# Patient Record
Sex: Female | Born: 1995 | Race: White | Hispanic: No | Marital: Single | Smoking: Never smoker
Health system: Southern US, Community
[De-identification: ages and names within clinical notes are randomized; demographics above are authoritative.]

## PROBLEM LIST (undated history)

## (undated) DIAGNOSIS — Z9289 Personal history of other medical treatment: Secondary | ICD-10-CM

## (undated) DIAGNOSIS — I10 Essential (primary) hypertension: Secondary | ICD-10-CM

## (undated) DIAGNOSIS — N289 Disorder of kidney and ureter, unspecified: Secondary | ICD-10-CM

## (undated) DIAGNOSIS — E215 Disorder of parathyroid gland, unspecified: Secondary | ICD-10-CM

## (undated) DIAGNOSIS — R112 Nausea with vomiting, unspecified: Secondary | ICD-10-CM

## (undated) DIAGNOSIS — Z9889 Other specified postprocedural states: Secondary | ICD-10-CM

## (undated) DIAGNOSIS — T4145XA Adverse effect of unspecified anesthetic, initial encounter: Secondary | ICD-10-CM

## (undated) DIAGNOSIS — D649 Anemia, unspecified: Secondary | ICD-10-CM

## (undated) DIAGNOSIS — M549 Dorsalgia, unspecified: Secondary | ICD-10-CM

## (undated) DIAGNOSIS — T8859XA Other complications of anesthesia, initial encounter: Secondary | ICD-10-CM

## (undated) HISTORY — PX: RENAL BIOPSY: SHX156

## (undated) HISTORY — DX: Essential (primary) hypertension: I10

## (undated) HISTORY — PX: NEPHRECTOMY TRANSPLANTED ORGAN: SUR880

---
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
## 2019-01-25 ENCOUNTER — Emergency Department (HOSPITAL_COMMUNITY)
Admission: EM | Admit: 2019-01-25 | Discharge: 2019-01-25 | Disposition: A | Discharge status: Home / Self Care | Payer: Medicaid Other | Attending: Emergency Medicine | Admitting: Emergency Medicine

## 2019-01-25 ENCOUNTER — Other Ambulatory Visit: Payer: Self-pay

## 2019-01-25 ENCOUNTER — Encounter (HOSPITAL_COMMUNITY): Payer: Self-pay | Admitting: Emergency Medicine

## 2019-01-25 DIAGNOSIS — Z940 Kidney transplant status: Secondary | ICD-10-CM | POA: Insufficient documentation

## 2019-01-25 DIAGNOSIS — Z992 Dependence on renal dialysis: Secondary | ICD-10-CM | POA: Insufficient documentation

## 2019-01-25 DIAGNOSIS — D599 Acquired hemolytic anemia, unspecified: Secondary | ICD-10-CM | POA: Insufficient documentation

## 2019-01-25 DIAGNOSIS — D649 Anemia, unspecified: Secondary | ICD-10-CM

## 2019-01-25 HISTORY — DX: Anemia, unspecified: D64.9

## 2019-01-25 HISTORY — DX: Disorder of kidney and ureter, unspecified: N28.9

## 2019-01-25 LAB — CBC WITH DIFFERENTIAL/PLATELET
Abs Immature Granulocytes: 0.03 10*3/uL (ref 0.00–0.07)
Basophils Absolute: 0.1 10*3/uL (ref 0.0–0.1)
Basophils Relative: 1 %
Eosinophils Absolute: 0.3 10*3/uL (ref 0.0–0.5)
Eosinophils Relative: 3 %
HCT: 26.7 % — ABNORMAL LOW (ref 36.0–46.0)
Hemoglobin: 8.4 g/dL — ABNORMAL LOW (ref 12.0–15.0)
Immature Granulocytes: 0 %
Lymphocytes Relative: 17 %
Lymphs Abs: 1.7 10*3/uL (ref 0.7–4.0)
MCH: 30.4 pg (ref 26.0–34.0)
MCHC: 31.5 g/dL (ref 30.0–36.0)
MCV: 96.7 fL (ref 80.0–100.0)
Monocytes Absolute: 1.1 10*3/uL — ABNORMAL HIGH (ref 0.1–1.0)
Monocytes Relative: 11 %
Neutro Abs: 6.6 10*3/uL (ref 1.7–7.7)
Neutrophils Relative %: 68 %
Platelets: 243 10*3/uL (ref 150–400)
RBC: 2.76 MIL/uL — ABNORMAL LOW (ref 3.87–5.11)
RDW: 14.5 % (ref 11.5–15.5)
WBC: 9.8 10*3/uL (ref 4.0–10.5)
nRBC: 0 % (ref 0.0–0.2)

## 2019-01-25 LAB — BASIC METABOLIC PANEL
Anion gap: 15 (ref 5–15)
BUN: 49 mg/dL — ABNORMAL HIGH (ref 6–20)
CO2: 15 mmol/L — ABNORMAL LOW (ref 22–32)
Calcium: 8.6 mg/dL — ABNORMAL LOW (ref 8.9–10.3)
Chloride: 110 mmol/L (ref 98–111)
Creatinine, Ser: 10.33 mg/dL — ABNORMAL HIGH (ref 0.44–1.00)
GFR calc Af Amer: 6 mL/min — ABNORMAL LOW (ref >60)
GFR calc non Af Amer: 5 mL/min — ABNORMAL LOW (ref >60)
Glucose, Bld: 85 mg/dL (ref 70–99)
Potassium: 3.7 mmol/L (ref 3.5–5.1)
Sodium: 140 mmol/L (ref 135–145)

## 2019-01-25 NOTE — Discharge Instructions (Signed)
Your labs were reassuring today. You will need to have dialysis tomorrow at the Dialysis center you were scheduled to go to. Please go to dialysis tomorrow as instructed. Return to the ER for emergent changes or worsening symptoms.

## 2019-01-25 NOTE — ED Triage Notes (Signed)
Pt in with need for dialysis. States she recently moved from [place] to [place] and had last trx on Monday in [place]. Called [facility] to get trx today and was told this had to be coordinated by an ED visit first and SW consult. Denies any sob

## 2019-01-25 NOTE — Progress Notes (Signed)
CSW received call from patient's RN Vicente Males regarding the patient needing assistance with coordinating her dialysis treatment due to a recent relocation from [place], [place] to [facility]. CSW informed RN that contact would be made with Terri Piedra, LCSW to assist this patient with coordinating her treatments.  CSW spoke with Terri Piedra to provide her with information for patient and her current needs. Colleen to reach out to patient's RN and patient to provide assistance.  Madilyn Fireman, MSW, LCSW-A Clinical Social Worker [facility] [phone]

## 2019-01-25 NOTE — Progress Notes (Addendum)
cRenal Navigator received call from CSW/C. Shon Baton informing of patient's arrival to ED needing OP HD arranged. Renal Navigator researched situation and worked closely with Dr. Webb/Nephrologist and OP HD Clinic Manager at [facility] to ensure that patient can receive HD treatment at [facility] tomorrow, Saturday 01/26/19. She needs to arrive at the clinic at 8:45am. Patient aware. Renal Navigator verified patient's address-correct in Epic and phone number, which she states is [phone]. All necessary labs have been drawn for her start tomorrow. She will continue on a TTS schedule after tomorrow.   Renal Navigator notes misspelling in patient's last name in Epic. There is no "d" in her last name. Renal Navigator verified spelling with patient. Renal Navigator contacted [facility] to have this corrected.   [facility] [address] [place], [place]

## 2019-01-25 NOTE — ED Provider Notes (Signed)
[facility] EMERGENCY DEPARTMENT Provider Note   CSN: [id number] Arrival date & time: 01/25/19  1106    History   Chief Complaint Chief Complaint  Patient presents with   needs dialysis    HPI    Tara Mills is a 23 y.o. female with a PMHx of anemia and kidney transplant 2007 now on dialysis emergently started last week, who presents to the ED with complaints of needing dialysis.  Patient just moved a few days ago down to the area and was supposed to get dialysis at [facility] in [place] today, but they needed "an antigen test" and therefore were not set up to do dialysis for her today, so they told her to come here to get dialysis done.  She states that she last had dialysis on Monday.  She just recently started on dialysis last week emergently.  She explains that she got "sick" at the beginning of the month, had syncopal episodes, was seen in the ED in [place] where she is from and was found to have a hemoglobin of 4, creatinine of 33, and "kidney function of 0", so she was life flighted to [facility] in [place] and started on emergent dialysis.  She was discharged and told that she was cleared to go ahead with her move down to [place], and that she was supposed to start dialysis in [place] on Friday.  Currently they think she will only need dialysis twice a week, and her next visit was supposed to be today.  When she arrived at present is in [place] they told her they were not set up for her, so her kidney doctor in [place] spoke with the [facility] and decided that she needed to come here for dialysis today.  She does not currently have a PCP here.  She is not sure of who the doctor is at the facility in [place].  Her kidney doctor in [place] is Dr. Verne Grain.  She denies having any fevers, chills, cough, CP, SOB, LE swelling, abd pain, N/V/D/C, hematuria, dysuria, myalgias, arthralgias, numbness, tingling, focal weakness, or  any other complaints at this time.   The history is provided by the patient and medical records. No language interpreter was used.    Past Medical History:  Diagnosis Date   Anemia    Renal disorder     There are no active problems to display for this patient.   Past Surgical History:  Procedure Laterality Date   NEPHRECTOMY TRANSPLANTED ORGAN     2007     OB History   No obstetric history on file.      Home Medications    Prior to Admission medications   Not on File    Family History No family history on file.  Social History Social History   Tobacco Use   Smoking status: Never Smoker   Smokeless tobacco: Never Used  Substance Use Topics   Alcohol use: Not Currently   Drug use: Never     Allergies   Patient has no known allergies.   Review of Systems Review of Systems  Constitutional: Negative for chills and fever.  Respiratory: Negative for shortness of breath.   Cardiovascular: Negative for chest pain and leg swelling.  Gastrointestinal: Negative for abdominal pain, constipation, diarrhea, nausea and vomiting.  Genitourinary: Negative for dysuria and hematuria.  Musculoskeletal: Negative for arthralgias and myalgias.  Skin: Negative for color change.  Allergic/Immunologic: Negative for immunocompromised state.  Neurological: Negative for weakness and numbness.  Psychiatric/Behavioral: Negative for confusion.   All other systems reviewed and are negative for acute change except as noted in the HPI.    Physical Exam Updated Vital Signs BP (!) 127/94 (BP Location: Right Arm)    Pulse 83    Temp 98.7 F (37.1 C) (Oral)    Resp 15    Wt 72.6 kg    SpO2 100%   Physical Exam Vitals signs and nursing note reviewed.  Constitutional:      General: She is not in acute distress.    Appearance: Normal appearance. She is well-developed. She is not toxic-appearing.     Comments: Afebrile, nontoxic, NAD  HENT:     Head: Normocephalic and  atraumatic.  Eyes:     General:        Right eye: No discharge.        Left eye: No discharge.     Conjunctiva/sclera: Conjunctivae normal.  Neck:     Musculoskeletal: Normal range of motion and neck supple.  Cardiovascular:     Rate and Rhythm: Normal rate and regular rhythm.     Pulses: Normal pulses.     Heart sounds: Normal heart sounds, S1 normal and S2 normal. No murmur. No friction rub. No gallop.   Pulmonary:     Effort: Pulmonary effort is normal. No respiratory distress.     Breath sounds: Normal breath sounds. No decreased breath sounds, wheezing, rhonchi or rales.  Chest:     Comments: Dialysis catheter to R chest wall c/d/i Abdominal:     General: Bowel sounds are normal. There is no distension.     Palpations: Abdomen is soft. Abdomen is not rigid.     Tenderness: There is no abdominal tenderness. There is no right CVA tenderness, left CVA tenderness, guarding or rebound. Negative signs include Murphy's sign and McBurney's sign.     Comments: Well healed scar to RLQ abd soft, NTND, no r/g/r, +BS throughout, neg murphy's, neg mcburney's, no CVA TTP  Musculoskeletal: Normal range of motion.  Skin:    General: Skin is warm and dry.     Findings: No rash.  Neurological:     Mental Status: She is alert and oriented to person, place, and time.     Sensory: Sensation is intact. No sensory deficit.     Motor: Motor function is intact.  Psychiatric:        Mood and Affect: Mood and affect normal.        Behavior: Behavior normal.      ED Treatments / Results  Labs (all labs ordered are listed, but only abnormal results are displayed) Labs Reviewed  CBC WITH DIFFERENTIAL/PLATELET - Abnormal; Notable for the following components:      Result Value   RBC 2.76 (*)    Hemoglobin 8.4 (*)    HCT 26.7 (*)    Monocytes Absolute 1.1 (*)    All other components within normal limits  BASIC METABOLIC PANEL - Abnormal; Notable for the following components:   CO2 15 (*)     BUN 49 (*)    Creatinine, Ser 10.33 (*)    Calcium 8.6 (*)    GFR calc non Af Amer 5 (*)    GFR calc Af Amer 6 (*)    All other components within normal limits    EKG None  Radiology No results found.  Procedures Procedures (including critical care time)  Medications Ordered in ED Medications - No data to display   Initial Impression /  Assessment and Plan / ED Course  I have reviewed the triage vital signs and the nursing notes.  Pertinent labs & imaging results that were available during my care of the patient were reviewed by me and considered in my medical decision making (see chart for details).        23 y.o. female here with needing dialysis. Just moved here from [place], had kidney transplant in 2007, was well until earlier this month started not feeling well, went to ER in [place] and found to have Cr 33, "kidney function of zero", and Hgb 4. Was transferred to [facility] and started emergent dialysis. Last dialysis was Monday, was supposed to get dialysis today at [facility] in [place] but it wasn't set up for her yet so they told her to come here for dialysis. Feels well at this time. On exam, no abdominal tenderness, clear lungs, no visible edema in the legs. Dialysis cath in R chest c/d/i. Will get labs and call nephrology to discuss case. Discussed case with my attending Dr. Eulis Foster who agrees with plan.   12:19 PM Dr. Justin Mend of nephrology returning page, wants labs and call back at that time, he will work on coordinating care. Will reassess once labs are back.   1:17 PM CBC w/diff with Hgb 8.4/Hct 26.7 unclear baseline but otherwise unremarkable. BMP with normal K, bicarb 15, BUN 49, Cr 10.33, GFR 5. Dr. Justin Mend down to see pt, states she's stable for outpatient dialysis, the center is set up for her tomorrow and she can go there tomorrow for her dialysis since she doesn't appear to need emergent dialysis currently. Pt feels well and is stable for d/c. Advised going to dialysis  tomorrow as instructed. I explained the diagnosis and have given explicit precautions to return to the ER including for any other new or worsening symptoms. The patient understands and accepts the medical plan as it's been dictated and I have answered their questions. Discharge instructions concerning home care and prescriptions have been given. The patient is STABLE and is discharged to home in good condition.    Final Clinical Impressions(s) / ED Diagnoses   Final diagnoses:  Dialysis patient [facility])  Chronic anemia    ED Discharge Orders    [address], [place], [place] 01/25/19 [place], MD 01/26/19 [phone]

## 2019-05-02 MED ORDER — ANTI-OXIDANT TAKE ONE PO
0.50 | ORAL | Status: DC
Start: ? — End: 2019-05-02

## 2019-05-02 MED ORDER — CLOTRIMAZOLE 3 DAY VA
210.00 | VAGINAL | Status: DC
Start: 2019-05-01 — End: 2019-05-02

## 2019-05-02 MED ORDER — PSEUDOEPHEDRINE-GG & DM 60-375 & 20 MG &MG/5ML PO KIT
1000.00 | PACK | ORAL | Status: DC
Start: 2019-04-30 — End: 2019-05-02

## 2019-05-02 MED ORDER — ISOVUE-M 300 61 % IJ SOLN
4.00 | INTRAMUSCULAR | Status: DC
Start: ? — End: 2019-05-02

## 2019-05-02 MED ORDER — DAMOR DRESSING EX PADS
10.00 | MEDICATED_PAD | CUTANEOUS | Status: DC
Start: 2019-05-03 — End: 2019-05-02

## 2019-05-02 MED ORDER — Medication
750.00 | Status: DC
Start: 2019-04-30 — End: 2019-05-02

## 2019-05-02 MED ORDER — SODIUM CHLORIDE 0.9 % IV SOLN
INTRAVENOUS | Status: DC
Start: ? — End: 2019-05-02

## 2019-05-02 MED ORDER — DERMACAINE-ALOE 6-0.1 % EX CREA
2.00 | TOPICAL_CREAM | CUTANEOUS | Status: DC
Start: ? — End: 2019-05-02

## 2019-05-02 MED ORDER — MP TRI-FED COLD 2.5-60 MG PO TABS
25.00 | ORAL_TABLET | ORAL | Status: DC
Start: ? — End: 2019-05-02

## 2019-05-02 MED ORDER — OXYCODONE HCL 5 MG/5ML PO SOLN
5.00 | ORAL | Status: DC
Start: ? — End: 2019-05-02

## 2019-05-02 MED ORDER — CELLULOSE SODIUM PHOSPHATE VI
5000.00 | Status: DC
Start: 2019-04-30 — End: 2019-05-02

## 2019-05-02 MED ORDER — PHENYLEPHRINE-GUAIFENESIN 30-400 MG PO CP12
10.00 | ORAL_CAPSULE | ORAL | Status: DC
Start: 2019-05-03 — End: 2019-05-02

## 2019-05-02 MED ORDER — PSEUDOEPHEDRINE-GG & DM 60-375 & 20 MG &MG/5ML PO KIT
1000.00 | PACK | ORAL | Status: DC
Start: 2019-05-02 — End: 2019-05-02

## 2019-05-02 MED ORDER — Medication
10.00 | Status: DC
Start: 2019-04-30 — End: 2019-05-02

## 2019-05-02 MED ORDER — PHENYLEPHRINE-GUAIFENESIN 20-375 MG PO CP12
10.00 | ORAL_CAPSULE | ORAL | Status: DC
Start: ? — End: 2019-05-02

## 2019-05-02 MED ORDER — Medication
1.00 | Status: DC
Start: 2019-04-30 — End: 2019-05-02

## 2019-05-02 MED ORDER — DAMOR DRESSING EX PADS
10.00 | MEDICATED_PAD | CUTANEOUS | Status: DC
Start: ? — End: 2019-05-02

## 2019-05-02 MED ORDER — SURE COMFORT INSULIN SYRINGE 30G X 1/2" 0.5 ML MISC
25.00 | Status: DC
Start: 2019-05-01 — End: 2019-05-02

## 2019-05-02 MED ORDER — SURE COMFORT INSULIN SYRINGE 30G X 1/2" 0.5 ML MISC
25.00 | Status: DC
Start: 2019-05-03 — End: 2019-05-02

## 2019-05-02 MED ORDER — PRO HERBS ENERGY PO TABS
20.00 | ORAL_TABLET | ORAL | Status: DC
Start: 2019-05-02 — End: 2019-05-02

## 2019-05-02 MED ORDER — HEART 140 MG PO TABS
0.50 | ORAL_TABLET | ORAL | Status: DC
Start: ? — End: 2019-05-02

## 2019-05-02 MED ORDER — SM ROLLED GAUZE 2"X2.5YD MISC
2.00 | Status: DC
Start: 2019-05-02 — End: 2019-05-02

## 2019-05-02 MED ORDER — GLUCOSAMINE-CHONDROIT-COLLAGEN PO
100.00 | ORAL | Status: DC
Start: ? — End: 2019-05-02

## 2019-05-02 MED ORDER — OATMEAL BATH OILATED EX PACK
200.00 | PACK | CUTANEOUS | Status: DC
Start: ? — End: 2019-05-02

## 2019-05-02 MED ORDER — Medication
1.00 | Status: DC
Start: ? — End: 2019-05-02

## 2019-05-02 MED ORDER — GENERIC EXTERNAL MEDICATION
10.00 | Status: DC
Start: ? — End: 2019-05-02

## 2019-05-02 MED ORDER — FP ANTI-DIARRHEAL 1 MG/5ML PO LIQD
500.00 | ORAL | Status: DC
Start: 2019-04-30 — End: 2019-05-02

## 2019-05-02 MED ORDER — CELLULOSE SODIUM PHOSPHATE VI
5000.00 | Status: DC
Start: 2019-05-02 — End: 2019-05-02

## 2019-05-02 MED ORDER — NUTREN RENAL PO LIQD
1600.00 | ORAL | Status: DC
Start: 2019-05-02 — End: 2019-05-02

## 2019-05-02 MED ORDER — EQUATE NICOTINE 4 MG MT GUM
4.00 | CHEWING_GUM | OROMUCOSAL | Status: DC
Start: ? — End: 2019-05-02

## 2019-05-02 MED ORDER — Medication
1.00 | Status: DC
Start: 2019-05-01 — End: 2019-05-02

## 2019-05-02 MED ORDER — SM ROLLED GAUZE 2"X2.5YD MISC
2.00 | Status: DC
Start: 2019-04-30 — End: 2019-05-02

## 2019-05-02 MED ORDER — Medication
1.00 | Status: DC
Start: 2019-05-03 — End: 2019-05-02

## 2019-05-02 MED ORDER — METHYLPHENIDATE HCL POWD
50.00 | Status: DC
Start: ? — End: 2019-05-02

## 2019-05-02 MED ORDER — PHENYLEPHRINE-GUAIFENESIN 30-400 MG PO CP12
5.00 | ORAL_CAPSULE | ORAL | Status: DC
Start: 2019-05-01 — End: 2019-05-02

## 2019-05-02 MED ORDER — NUTREN RENAL PO LIQD
1600.00 | ORAL | Status: DC
Start: 2019-04-30 — End: 2019-05-02

## 2019-05-02 MED ORDER — QUINERVA 260 MG PO TABS
650.00 | ORAL_TABLET | ORAL | Status: DC
Start: ? — End: 2019-05-02

## 2019-05-02 MED ORDER — Medication
30.00 | Status: DC
Start: ? — End: 2019-05-02

## 2019-07-04 ENCOUNTER — Ambulatory Visit: Payer: Self-pay | Admitting: Surgery

## 2019-10-18 ENCOUNTER — Emergency Department (HOSPITAL_COMMUNITY): Payer: Medicaid Other

## 2019-10-18 ENCOUNTER — Emergency Department (HOSPITAL_COMMUNITY)
Admission: EM | Admit: 2019-10-18 | Discharge: 2019-10-18 | Disposition: A | Discharge status: Home / Self Care | Payer: Medicaid Other | Attending: Emergency Medicine | Admitting: Emergency Medicine

## 2019-10-18 ENCOUNTER — Other Ambulatory Visit: Payer: Self-pay

## 2019-10-18 ENCOUNTER — Encounter (HOSPITAL_COMMUNITY): Payer: Self-pay | Admitting: Emergency Medicine

## 2019-10-18 DIAGNOSIS — G43109 Migraine with aura, not intractable, without status migrainosus: Secondary | ICD-10-CM | POA: Diagnosis not present

## 2019-10-18 DIAGNOSIS — N186 End stage renal disease: Secondary | ICD-10-CM | POA: Diagnosis not present

## 2019-10-18 DIAGNOSIS — Z992 Dependence on renal dialysis: Secondary | ICD-10-CM | POA: Insufficient documentation

## 2019-10-18 DIAGNOSIS — H538 Other visual disturbances: Secondary | ICD-10-CM | POA: Diagnosis present

## 2019-10-18 LAB — CBC WITH DIFFERENTIAL/PLATELET
Abs Immature Granulocytes: 0.04 10*3/uL (ref 0.00–0.07)
Basophils Absolute: 0 10*3/uL (ref 0.0–0.1)
Basophils Relative: 1 %
Eosinophils Absolute: 0.2 10*3/uL (ref 0.0–0.5)
Eosinophils Relative: 3 %
HCT: 26.1 % — ABNORMAL LOW (ref 36.0–46.0)
Hemoglobin: 8.2 g/dL — ABNORMAL LOW (ref 12.0–15.0)
Immature Granulocytes: 1 %
Lymphocytes Relative: 11 %
Lymphs Abs: 0.7 10*3/uL (ref 0.7–4.0)
MCH: 32 pg (ref 26.0–34.0)
MCHC: 31.4 g/dL (ref 30.0–36.0)
MCV: 102 fL — ABNORMAL HIGH (ref 80.0–100.0)
Monocytes Absolute: 0.5 10*3/uL (ref 0.1–1.0)
Monocytes Relative: 8 %
Neutro Abs: 5 10*3/uL (ref 1.7–7.7)
Neutrophils Relative %: 76 %
Platelets: 163 10*3/uL (ref 150–400)
RBC: 2.56 MIL/uL — ABNORMAL LOW (ref 3.87–5.11)
RDW: 15.6 % — ABNORMAL HIGH (ref 11.5–15.5)
WBC: 6.4 10*3/uL (ref 4.0–10.5)
nRBC: 0 % (ref 0.0–0.2)

## 2019-10-18 LAB — BASIC METABOLIC PANEL
Anion gap: 24 — ABNORMAL HIGH (ref 5–15)
BUN: 97 mg/dL — ABNORMAL HIGH (ref 6–20)
CO2: 14 mmol/L — ABNORMAL LOW (ref 22–32)
Calcium: 8 mg/dL — ABNORMAL LOW (ref 8.9–10.3)
Chloride: 101 mmol/L (ref 98–111)
Creatinine, Ser: 23.98 mg/dL — ABNORMAL HIGH (ref 0.44–1.00)
GFR calc Af Amer: 2 mL/min — ABNORMAL LOW (ref >60)
GFR calc non Af Amer: 2 mL/min — ABNORMAL LOW (ref >60)
Glucose, Bld: 99 mg/dL (ref 70–99)
Potassium: 4.6 mmol/L (ref 3.5–5.1)
Sodium: 139 mmol/L (ref 135–145)

## 2019-10-18 MED ORDER — PROMETHAZINE HCL 25 MG/ML IJ SOLN
12.5000 mg | Freq: Once | INTRAMUSCULAR | Status: AC
  Administered 2019-10-18: 12.5 mg via INTRAVENOUS
  Filled 2019-10-18: qty 1

## 2019-10-18 MED ORDER — PROMETHAZINE HCL 25 MG/ML IJ SOLN
12.5000 mg | Freq: Once | INTRAMUSCULAR | Status: AC
  Administered 2019-10-18: 13:00:00 12.5 mg via INTRAVENOUS
  Filled 2019-10-18: qty 1

## 2019-10-18 MED ORDER — IOHEXOL 350 MG/ML SOLN
75.0000 mL | Freq: Once | INTRAVENOUS | Status: AC | PRN
  Administered 2019-10-18: 75 mL via INTRAVENOUS

## 2019-10-18 NOTE — ED Provider Notes (Signed)
[facility] EMERGENCY DEPARTMENT Provider Note   CSN: [id number] Arrival date & time: 10/18/19  1113     History Chief Complaint  Patient presents with  . Vision Changes    Tara Mills is a 24 y.o. female.  HPI Patient presents with visual chain on does been going on for a while but recently developed spots in her right eye.  States that they will be in different areas of the eye.  It will come and go.  Sometimes will move around a little bit.  Usually accompanied with a headache.  No numbness or weakness.  She is a dialysis patient.  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  She is currently on peritoneal dialysis.  No other numbness or weakness.  She wears glasses that she states she got a few months ago.  States she always has decreased vision in her left eye.    Past Medical History:  Diagnosis Date  . Anemia   . Renal disorder     There are no problems to display for this patient.   Past Surgical History:  Procedure Laterality Date  . NEPHRECTOMY TRANSPLANTED ORGAN     2007     OB History   No obstetric history on file.     No family history on file.  Social History   Tobacco Use  . Smoking status: Never Smoker  . Smokeless tobacco: Never Used  Substance Use Topics  . Alcohol use: Not Currently  . Drug use: Never    Home Medications Prior to Admission medications   Not on File    Allergies    Patient has no known allergies.  Review of Systems   Review of Systems  Constitutional: Negative for appetite change.  HENT: Negative for congestion.   Eyes: Positive for visual disturbance.  Respiratory: Negative for shortness of breath.   Gastrointestinal: Negative for abdominal pain.  Genitourinary: Negative for flank pain.  Musculoskeletal: Negative for back pain.  Skin: Negative for rash.  Neurological: Negative for weakness.  Psychiatric/Behavioral: Negative for confusion.    Physical Exam  Updated Vital Signs BP (!) 186/134 (BP Location: Left Arm)   Pulse (!) 101   Temp 98 F (36.7 C) (Oral)   Resp 17   Ht 5' (1.524 m)   Wt 57.6 kg   LMP 10/06/2019   SpO2 98%   BMI 24.80 kg/m   Physical Exam Vitals reviewed.  HENT:     Head:     Comments: Patient has blue hair    Mouth/Throat:     Mouth: Mucous membranes are moist.  Eyes:     Extraocular Movements: Extraocular movements intact.     Pupils: Pupils are equal, round, and reactive to light.  Cardiovascular:     Rate and Rhythm: Normal rate.  Pulmonary:     Effort: Pulmonary effort is normal.  Abdominal:     Tenderness: There is no abdominal tenderness.  Neurological:     Mental Status: She is alert.     ED Results / Procedures / Treatments   Labs (all labs ordered are listed, but only abnormal results are displayed) Labs Reviewed  CBC WITH DIFFERENTIAL/PLATELET - Abnormal; Notable for the following components:      Result Value   RBC 2.56 (*)    Hemoglobin 8.2 (*)    HCT 26.1 (*)    MCV 102.0 (*)    RDW 15.6 (*)    All other components within  normal limits  BASIC METABOLIC PANEL - Abnormal; Notable for the following components:   CO2 14 (*)    BUN 97 (*)    Creatinine, Ser 23.98 (*)    Calcium 8.0 (*)    GFR calc non Af Amer 2 (*)    GFR calc Af Amer 2 (*)    Anion gap 24 (*)    All other components within normal limits    EKG None  Radiology No results found.  Procedures Procedures (including critical care time)  Medications Ordered in ED Medications  promethazine (PHENERGAN) injection 12.5 mg (12.5 mg Intravenous Given 10/18/19 1326)  promethazine (PHENERGAN) injection 12.5 mg (12.5 mg Intravenous Given 10/18/19 1438)    ED Course  I have reviewed the triage vital signs and the nursing notes.  Pertinent labs & imaging results that were available during my care of the patient were reviewed by me and considered in my medical decision making (see chart for details).    MDM  Rules/Calculators/A&P                      Patient with vision changes.  Mostly in the right eye.  Has end-stage renal disease is on peritoneal dialysis.  Had failed previous kidney transplant.  Vision changes move around in her.I think is more likely complicated migraine/aura.  However is not had work-up for.  Will get head CT with contrast to evaluate vasculature also since there is some posterior neck pain.  However CT scan will not be done unless I call nephrology.  I have discussed with Dr. Posey Pronto from nephrology and he thinks it is okay for the scan. Care will be turned over to oncoming provider, Dr. Stark Jock Final Clinical Impression(s) / ED Diagnoses Final diagnoses:  Complicated migraine  End stage renal disease on dialysis [facility])    Rx / DC Orders ED Discharge Orders    None       Davonna Belling, MD 10/18/19 1452

## 2019-10-18 NOTE — ED Provider Notes (Signed)
Patient is a 24 year old female with history of end-stage renal disease on peritoneal dialysis presenting with complaints of headache and visual disturbances.  Care was signed out to me awaiting results of a CT angiogram of the head and neck.  The studies have been performed and are unremarkable.  Patient neurologically intact and is feeling better after receiving Phenergan.  Patient to be discharged with follow up with neurology and ophthalmology, to return as needed for any problems.   Veryl Speak, MD 10/18/19 [phone]

## 2019-10-18 NOTE — Discharge Instructions (Addendum)
Follow-up with ophthalmology and neurology.  The contact information for the specialties has been provided in this discharge summary for you to call and make these arrangements.

## 2019-10-18 NOTE — ED Triage Notes (Signed)
Patient reports black spots and worsening vision in her right eye. States she is unsure if much difference in her left eye since vision is bad. Symptoms are intermittent. Patient is PD dialysis done daily.

## 2019-10-18 NOTE — ED Notes (Signed)
Returned from Ct scan  

## 2019-10-21 ENCOUNTER — Inpatient Hospital Stay (HOSPITAL_COMMUNITY): Payer: Medicaid Other

## 2019-10-21 ENCOUNTER — Emergency Department (HOSPITAL_COMMUNITY): Payer: Medicaid Other

## 2019-10-21 ENCOUNTER — Inpatient Hospital Stay (HOSPITAL_COMMUNITY)
Admission: EM | Admit: 2019-10-21 | Discharge: 2019-10-26 | DRG: 304 | Disposition: A | Discharge status: Home / Self Care | Payer: Medicaid Other | Source: Ambulatory Visit | Attending: Internal Medicine | Admitting: Internal Medicine

## 2019-10-21 ENCOUNTER — Other Ambulatory Visit: Payer: Self-pay

## 2019-10-21 ENCOUNTER — Encounter (HOSPITAL_COMMUNITY): Payer: Self-pay | Admitting: Emergency Medicine

## 2019-10-21 DIAGNOSIS — I1311 Hypertensive heart and chronic kidney disease without heart failure, with stage 5 chronic kidney disease, or end stage renal disease: Secondary | ICD-10-CM | POA: Diagnosis present

## 2019-10-21 DIAGNOSIS — D696 Thrombocytopenia, unspecified: Secondary | ICD-10-CM | POA: Diagnosis present

## 2019-10-21 DIAGNOSIS — D631 Anemia in chronic kidney disease: Secondary | ICD-10-CM | POA: Diagnosis present

## 2019-10-21 DIAGNOSIS — E872 Acidosis: Secondary | ICD-10-CM | POA: Diagnosis present

## 2019-10-21 DIAGNOSIS — Z886 Allergy status to analgesic agent status: Secondary | ICD-10-CM | POA: Diagnosis not present

## 2019-10-21 DIAGNOSIS — N2581 Secondary hyperparathyroidism of renal origin: Secondary | ICD-10-CM | POA: Diagnosis present

## 2019-10-21 DIAGNOSIS — I120 Hypertensive chronic kidney disease with stage 5 chronic kidney disease or end stage renal disease: Secondary | ICD-10-CM | POA: Diagnosis not present

## 2019-10-21 DIAGNOSIS — E875 Hyperkalemia: Secondary | ICD-10-CM | POA: Diagnosis present

## 2019-10-21 DIAGNOSIS — Y830 Surgical operation with transplant of whole organ as the cause of abnormal reaction of the patient, or of later complication, without mention of misadventure at the time of the procedure: Secondary | ICD-10-CM | POA: Diagnosis present

## 2019-10-21 DIAGNOSIS — T8612 Kidney transplant failure: Secondary | ICD-10-CM | POA: Diagnosis present

## 2019-10-21 DIAGNOSIS — H469 Unspecified optic neuritis: Secondary | ICD-10-CM | POA: Diagnosis present

## 2019-10-21 DIAGNOSIS — H4710 Unspecified papilledema: Secondary | ICD-10-CM

## 2019-10-21 DIAGNOSIS — Z91018 Allergy to other foods: Secondary | ICD-10-CM

## 2019-10-21 DIAGNOSIS — Z992 Dependence on renal dialysis: Secondary | ICD-10-CM

## 2019-10-21 DIAGNOSIS — D649 Anemia, unspecified: Secondary | ICD-10-CM | POA: Diagnosis not present

## 2019-10-21 DIAGNOSIS — H543 Unqualified visual loss, both eyes: Secondary | ICD-10-CM | POA: Diagnosis present

## 2019-10-21 DIAGNOSIS — Z79899 Other long term (current) drug therapy: Secondary | ICD-10-CM | POA: Diagnosis not present

## 2019-10-21 DIAGNOSIS — Q639 Congenital malformation of kidney, unspecified: Secondary | ICD-10-CM | POA: Diagnosis not present

## 2019-10-21 DIAGNOSIS — J189 Pneumonia, unspecified organism: Secondary | ICD-10-CM | POA: Diagnosis present

## 2019-10-21 DIAGNOSIS — Z20822 Contact with and (suspected) exposure to covid-19: Secondary | ICD-10-CM | POA: Diagnosis present

## 2019-10-21 DIAGNOSIS — H547 Unspecified visual loss: Secondary | ICD-10-CM

## 2019-10-21 DIAGNOSIS — G2581 Restless legs syndrome: Secondary | ICD-10-CM | POA: Diagnosis present

## 2019-10-21 DIAGNOSIS — N186 End stage renal disease: Secondary | ICD-10-CM

## 2019-10-21 DIAGNOSIS — G43909 Migraine, unspecified, not intractable, without status migrainosus: Secondary | ICD-10-CM | POA: Diagnosis not present

## 2019-10-21 DIAGNOSIS — I161 Hypertensive emergency: Secondary | ICD-10-CM | POA: Diagnosis present

## 2019-10-21 DIAGNOSIS — Z9889 Other specified postprocedural states: Secondary | ICD-10-CM | POA: Diagnosis not present

## 2019-10-21 DIAGNOSIS — I313 Pericardial effusion (noninflammatory): Secondary | ICD-10-CM | POA: Diagnosis present

## 2019-10-21 DIAGNOSIS — D84821 Immunodeficiency due to drugs: Secondary | ICD-10-CM | POA: Diagnosis present

## 2019-10-21 DIAGNOSIS — Z8249 Family history of ischemic heart disease and other diseases of the circulatory system: Secondary | ICD-10-CM

## 2019-10-21 DIAGNOSIS — I160 Hypertensive urgency: Secondary | ICD-10-CM | POA: Diagnosis not present

## 2019-10-21 LAB — RESPIRATORY PANEL BY RT PCR (FLU A&B, COVID)
Influenza A by PCR: NEGATIVE
Influenza B by PCR: NEGATIVE
SARS Coronavirus 2 by RT PCR: NEGATIVE

## 2019-10-21 LAB — PROTIME-INR
INR: 1.2 (ref 0.8–1.2)
Prothrombin Time: 14.9 seconds (ref 11.4–15.2)

## 2019-10-21 LAB — CSF CELL COUNT WITH DIFFERENTIAL
RBC Count, CSF: 0 /mm3
Tube #: 3
WBC, CSF: 1 /mm3 (ref 0–5)

## 2019-10-21 LAB — CRYPTOCOCCAL ANTIGEN, CSF: Crypto Ag: NEGATIVE

## 2019-10-21 LAB — BASIC METABOLIC PANEL WITH GFR
Anion gap: 26 — ABNORMAL HIGH (ref 5–15)
BUN: 119 mg/dL — ABNORMAL HIGH (ref 6–20)
CO2: 10 mmol/L — ABNORMAL LOW (ref 22–32)
Calcium: 8.3 mg/dL — ABNORMAL LOW (ref 8.9–10.3)
Chloride: 99 mmol/L (ref 98–111)
Creatinine, Ser: 28.74 mg/dL — ABNORMAL HIGH (ref 0.44–1.00)
GFR calc Af Amer: 2 mL/min — ABNORMAL LOW
GFR calc non Af Amer: 1 mL/min — ABNORMAL LOW
Glucose, Bld: 86 mg/dL (ref 70–99)
Potassium: 5.9 mmol/L — ABNORMAL HIGH (ref 3.5–5.1)
Sodium: 135 mmol/L (ref 135–145)

## 2019-10-21 LAB — PROTEIN AND GLUCOSE, CSF
Glucose, CSF: 50 mg/dL (ref 40–70)
Total  Protein, CSF: 30 mg/dL (ref 15–45)

## 2019-10-21 LAB — HEPATIC FUNCTION PANEL
ALT: 13 U/L (ref 0–44)
AST: 12 U/L — ABNORMAL LOW (ref 15–41)
Albumin: 2.8 g/dL — ABNORMAL LOW (ref 3.5–5.0)
Alkaline Phosphatase: 119 U/L (ref 38–126)
Bilirubin, Direct: <0.1 mg/dL (ref 0.0–0.2)
Total Bilirubin: 1 mg/dL (ref 0.3–1.2)
Total Protein: 6.3 g/dL — ABNORMAL LOW (ref 6.5–8.1)

## 2019-10-21 LAB — CBC
HCT: 27.7 % — ABNORMAL LOW (ref 36.0–46.0)
Hemoglobin: 8.3 g/dL — ABNORMAL LOW (ref 12.0–15.0)
MCH: 31.6 pg (ref 26.0–34.0)
MCHC: 30 g/dL (ref 30.0–36.0)
MCV: 105.3 fL — ABNORMAL HIGH (ref 80.0–100.0)
Platelets: 117 10*3/uL — ABNORMAL LOW (ref 150–400)
RBC: 2.63 MIL/uL — ABNORMAL LOW (ref 3.87–5.11)
RDW: 15 % (ref 11.5–15.5)
WBC: 11.3 10*3/uL — ABNORMAL HIGH (ref 4.0–10.5)
nRBC: 0 % (ref 0.0–0.2)

## 2019-10-21 LAB — HIV ANTIBODY (ROUTINE TESTING W REFLEX): HIV Screen 4th Generation wRfx: NONREACTIVE

## 2019-10-21 MED ORDER — DELFLEX-LC/2.5% DEXTROSE 394 MOSM/L IP SOLN: INTRAPERITONEAL | Status: DC

## 2019-10-21 MED ORDER — SODIUM CHLORIDE 0.9 % IV SOLN
1.0000 g | INTRAVENOUS | Status: DC
  Administered 2019-10-22 – 2019-10-23 (×2): 1 g via INTRAVENOUS
  Filled 2019-10-21 (×2): qty 10

## 2019-10-21 MED ORDER — AZITHROMYCIN 250 MG PO TABS
250.0000 mg | ORAL_TABLET | Freq: Every day | ORAL | Status: DC
  Administered 2019-10-22 – 2019-10-24 (×3): 250 mg via ORAL
  Filled 2019-10-21 (×4): qty 1

## 2019-10-21 MED ORDER — SODIUM ZIRCONIUM CYCLOSILICATE 10 G PO PACK
10.0000 g | PACK | Freq: Once | ORAL | Status: AC
  Administered 2019-10-21: 18:00:00 10 g via ORAL
  Filled 2019-10-21: qty 1

## 2019-10-21 MED ORDER — CALCITRIOL 0.25 MCG PO CAPS
0.5000 ug | ORAL_CAPSULE | ORAL | Status: DC
  Administered 2019-10-25 – 2019-10-26 (×2): 0.5 ug via ORAL
  Filled 2019-10-21: qty 2

## 2019-10-21 MED ORDER — ACETAMINOPHEN 650 MG RE SUPP: 650.0000 mg | Freq: Four times a day (QID) | RECTAL | Status: DC | PRN

## 2019-10-21 MED ORDER — CINACALCET HCL 30 MG PO TABS
30.0000 mg | ORAL_TABLET | Freq: Every day | ORAL | Status: DC
  Administered 2019-10-24 – 2019-10-26 (×3): 30 mg via ORAL
  Filled 2019-10-21 (×5): qty 1

## 2019-10-21 MED ORDER — LOSARTAN POTASSIUM 50 MG PO TABS: 100.0000 mg | ORAL_TABLET | Freq: Every day | ORAL | Status: DC

## 2019-10-21 MED ORDER — PROMETHAZINE HCL 25 MG/ML IJ SOLN
25.0000 mg | Freq: Four times a day (QID) | INTRAMUSCULAR | Status: DC | PRN
  Administered 2019-10-21 – 2019-10-22 (×2): 25 mg via INTRAVENOUS
  Filled 2019-10-21 (×2): qty 1

## 2019-10-21 MED ORDER — ACETAMINOPHEN 325 MG PO TABS
650.0000 mg | ORAL_TABLET | Freq: Four times a day (QID) | ORAL | Status: DC | PRN
  Administered 2019-10-22: 650 mg via ORAL
  Filled 2019-10-21: qty 2

## 2019-10-21 MED ORDER — DARBEPOETIN ALFA 200 MCG/0.4ML IJ SOSY
200.0000 ug | PREFILLED_SYRINGE | INTRAMUSCULAR | Status: DC
  Filled 2019-10-21: qty 0.4

## 2019-10-21 MED ORDER — SODIUM BICARBONATE 650 MG PO TABS
1300.0000 mg | ORAL_TABLET | Freq: Two times a day (BID) | ORAL | Status: DC
  Administered 2019-10-22 – 2019-10-23 (×3): 1300 mg via ORAL
  Filled 2019-10-21 (×2): qty 2

## 2019-10-21 MED ORDER — ONDANSETRON HCL 4 MG/2ML IJ SOLN
4.0000 mg | Freq: Four times a day (QID) | INTRAMUSCULAR | Status: DC | PRN
  Administered 2019-10-21 – 2019-10-23 (×4): 4 mg via INTRAVENOUS
  Filled 2019-10-21 (×4): qty 2

## 2019-10-21 MED ORDER — CALCITRIOL 0.5 MCG PO CAPS
1.5000 ug | ORAL_CAPSULE | ORAL | Status: DC
  Filled 2019-10-21: qty 3

## 2019-10-21 MED ORDER — FAMOTIDINE 20 MG PO TABS
20.0000 mg | ORAL_TABLET | Freq: Every day | ORAL | Status: DC
  Administered 2019-10-21 – 2019-10-25 (×5): 20 mg via ORAL
  Filled 2019-10-21 (×6): qty 1

## 2019-10-21 MED ORDER — MYCOPHENOLATE MOFETIL 250 MG PO CAPS
1000.0000 mg | ORAL_CAPSULE | Freq: Two times a day (BID) | ORAL | Status: DC
  Administered 2019-10-22 – 2019-10-23 (×4): 1000 mg via ORAL
  Filled 2019-10-21 (×5): qty 4

## 2019-10-21 MED ORDER — HEPARIN 1000 UNIT/ML FOR PERITONEAL DIALYSIS: 500.0000 [IU] | INTRAMUSCULAR | Status: DC | PRN

## 2019-10-21 MED ORDER — RENA-VITE PO TABS
1.0000 | ORAL_TABLET | Freq: Every day | ORAL | Status: DC
  Administered 2019-10-21 – 2019-10-25 (×5): 1 via ORAL
  Filled 2019-10-21 (×6): qty 1

## 2019-10-21 MED ORDER — HEPARIN SODIUM (PORCINE) 5000 UNIT/ML IJ SOLN
5000.0000 [IU] | Freq: Three times a day (TID) | INTRAMUSCULAR | Status: DC
  Filled 2019-10-21 (×2): qty 1

## 2019-10-21 MED ORDER — OXYCODONE HCL 5 MG PO TABS
5.0000 mg | ORAL_TABLET | Freq: Once | ORAL | Status: AC
  Administered 2019-10-21: 22:00:00 5 mg via ORAL
  Filled 2019-10-21: qty 1

## 2019-10-21 MED ORDER — SODIUM CHLORIDE 0.9 % IV SOLN
125.0000 mg | INTRAVENOUS | Status: DC
  Administered 2019-10-22 – 2019-10-26 (×3): 125 mg via INTRAVENOUS
  Filled 2019-10-21 (×5): qty 10

## 2019-10-21 MED ORDER — DELFLEX-LC/1.5% DEXTROSE 344 MOSM/L IP SOLN: INTRAPERITONEAL | Status: DC

## 2019-10-21 MED ORDER — LABETALOL HCL 5 MG/ML IV SOLN
10.0000 mg | INTRAVENOUS | Status: DC | PRN
  Administered 2019-10-21: 19:00:00 10 mg via INTRAVENOUS
  Filled 2019-10-21: qty 4

## 2019-10-21 MED ORDER — GENTAMICIN SULFATE 0.1 % EX CREA
1.0000 "application " | TOPICAL_CREAM | Freq: Every day | CUTANEOUS | Status: DC
  Filled 2019-10-21 (×2): qty 15

## 2019-10-21 MED ORDER — HEPARIN 1000 UNIT/ML FOR PERITONEAL DIALYSIS: INTRAPERITONEAL | Status: DC | PRN

## 2019-10-21 MED ORDER — SODIUM BICARBONATE 8.4 % IV SOLN
100.0000 meq | Freq: Once | INTRAVENOUS | Status: AC
  Administered 2019-10-21: 20:00:00 100 meq via INTRAVENOUS
  Filled 2019-10-21: qty 50

## 2019-10-21 MED ORDER — TACROLIMUS 1 MG PO CAPS
3.0000 mg | ORAL_CAPSULE | Freq: Two times a day (BID) | ORAL | Status: DC
  Administered 2019-10-22 – 2019-10-25 (×7): 3 mg via ORAL
  Filled 2019-10-21 (×10): qty 3

## 2019-10-21 MED ORDER — BISACODYL 5 MG PO TBEC: 5.0000 mg | DELAYED_RELEASE_TABLET | Freq: Every evening | ORAL | Status: DC | PRN

## 2019-10-21 MED ORDER — HYDRALAZINE HCL 25 MG PO TABS
25.0000 mg | ORAL_TABLET | Freq: Two times a day (BID) | ORAL | Status: DC
  Administered 2019-10-21 – 2019-10-23 (×4): 25 mg via ORAL
  Filled 2019-10-21 (×5): qty 1

## 2019-10-21 MED ORDER — SODIUM CHLORIDE 0.9 % IV SOLN
500.0000 mg | Freq: Once | INTRAVENOUS | Status: AC
  Administered 2019-10-21: 17:00:00 500 mg via INTRAVENOUS
  Filled 2019-10-21: qty 500

## 2019-10-21 MED ORDER — CHLORHEXIDINE GLUCONATE CLOTH 2 % EX PADS
6.0000 | MEDICATED_PAD | Freq: Every day | CUTANEOUS | Status: DC
  Administered 2019-10-23 – 2019-10-26 (×4): 6 via TOPICAL

## 2019-10-21 MED ORDER — SODIUM CHLORIDE 0.9 % IV SOLN
1.0000 g | Freq: Once | INTRAVENOUS | Status: AC
  Administered 2019-10-21: 1 g via INTRAVENOUS
  Filled 2019-10-21: qty 10

## 2019-10-21 MED ORDER — AMLODIPINE BESYLATE 10 MG PO TABS
10.0000 mg | ORAL_TABLET | Freq: Every day | ORAL | Status: DC
  Administered 2019-10-21 – 2019-10-26 (×6): 10 mg via ORAL
  Filled 2019-10-21: qty 1
  Filled 2019-10-21: qty 2
  Filled 2019-10-21 (×4): qty 1

## 2019-10-21 MED ORDER — RAMELTEON 8 MG PO TABS
8.0000 mg | ORAL_TABLET | Freq: Every day | ORAL | Status: DC
  Administered 2019-10-22 – 2019-10-25 (×4): 8 mg via ORAL
  Filled 2019-10-21 (×6): qty 1

## 2019-10-21 NOTE — ED Notes (Signed)
This RN paged the admitting provider regarding continued pain and nausea. Awaiting orders.

## 2019-10-21 NOTE — Consult Note (Signed)
Neurology Consultation  Reason for Consult: vision loss and possible pseudotumor cerebri Referring Physician: Tamera Punt  CC: decreased vision  History is obtained from: patient  HPI: Tara Mills is a 24 y.o. female renal disease on PD and Anemia. Due to 5 days of increasing HA and decreased vision patient returned to ED after seeing a ophthalmologist who noted papilledema and sent patient to ED for evaluation of pseudotumor cerebri.  Neurology was consulted.   Patient states she started to notice a mild HA on Thursday with black spots in her vision. She att that time believed it was her HTN. The next day she was on her peritoneal dialysis and noted off and on loss of vision. She came to ED where CT head was obtained and did not show any abnormality. Appointment was made to see a neurologist but she could not get in until March. He symptoms worsened thus she went to the ophthalmologist and he noted papilledema and sent there straight to ED. She now can see shapes but not details.    ED course  Relevant labs include - K--5. BUN 11 CR 28.7 WBC 11.3 PLT 117  CT head shows-no CT evidence of acute intracranial abnormality.  Consider brain MRI for further evaluation.  Diffusely increased density of intracranial venous vasculature, suggestive of hemoconcentration/dehydration     Past Medical History:  Diagnosis Date  . Anemia   . Renal disorder     Family History  Problem Relation Age of Onset  . Hypertension Mother   . Hypertension Father    Social History:   reports that she has never smoked. She has never used smokeless tobacco. She reports previous alcohol use. She reports that she does not use drugs.  Medications No current facility-administered medications for this encounter.  Current Outpatient Medications:  .  amLODipine (NORVASC) 10 MG tablet, Take 10 mg by mouth daily., Disp: , Rfl:  .  amoxicillin-clavulanate (AUGMENTIN) 875-125 MG tablet, Take 1 tablet by mouth  daily. Take for 10 days, Disp: , Rfl:  .  B Complex-C-Folic Acid (DIALYVITE Q000111Q) 0.8 MG TABS, Take 1 tablet by mouth at bedtime., Disp: , Rfl:  .  calcitRIOL (ROCALTROL) 0.5 MCG capsule, Take 1.5 mcg by mouth 3 (three) times a week. Monday, Wednesday, Friday, Disp: , Rfl:  .  CELLCEPT 500 MG tablet, Take 1,000 mg by mouth 2 (two) times daily., Disp: , Rfl:  .  cinacalcet (SENSIPAR) 30 MG tablet, Take 30 mg by mouth daily., Disp: , Rfl:  .  CLARITIN 10 MG tablet, Take 10 mg by mouth daily as needed for allergies. , Disp: , Rfl:  .  diphenhydrAMINE (BENADRYL) 25 mg capsule, Take 25 mg by mouth as needed for allergies or sleep., Disp: , Rfl:  .  DULCOLAX 5 MG EC tablet, Take 5 mg by mouth at bedtime as needed for mild constipation. , Disp: , Rfl:  .  famotidine (PEPCID) 20 MG tablet, Take 20 mg by mouth at bedtime., Disp: , Rfl:  .  ferric citrate (AURYXIA) 1 GM 210 MG(Fe) tablet, Take 420 mg by mouth 3 (three) times daily with meals., Disp: , Rfl:  .  gentamicin cream (GARAMYCIN) 0.1 %, Apply 1 application topically daily. , Disp: , Rfl:  .  hydrALAZINE (APRESOLINE) 25 MG tablet, Take 25 mg by mouth 2 (two) times daily., Disp: , Rfl:  .  lactulose (CHRONULAC) 10 GM/15ML solution, Take 30 mLs by mouth as needed for mild constipation. , Disp: , Rfl:  .  losartan (COZAAR) 100 MG tablet, Take 100 mg by mouth at bedtime., Disp: , Rfl:  .  MIRALAX 17 GM/SCOOP powder, Take 1 Container by mouth as needed for mild constipation. , Disp: , Rfl:  .  NAC 600 600 MG CAPS, Take 600 mg by mouth daily., Disp: , Rfl:  .  tacrolimus (PROGRAF) 1 MG capsule, Take 2 mg by mouth 2 (two) times daily., Disp: , Rfl:   ROS:  General ROS: negative for - chills, fatigue, fever, night sweats, weight gain or weight loss Psychological ROS: negative for - behavioral disorder, hallucinations, memory difficulties, mood swings or suicidal ideation Ophthalmic ROS: positive for - blurry vision, eye pain or loss of vision ENT ROS:  negative for - epistaxis, nasal discharge, oral lesions, sore throat, tinnitus or vertigo Respiratory ROS: negative for - cough, hemoptysis, shortness of breath or wheezing Cardiovascular ROS: negative for - chest pain, dyspnea on exertion, edema or irregular heartbeat Gastrointestinal ROS: negative for - abdominal pain, diarrhea, hematemesis, nausea/vomiting or stool incontinence Genito-Urinary ROS: negative for - dysuria, hematuria, incontinence or urinary frequency/urgency Musculoskeletal ROS: negative for - joint swelling or muscular weakness Neurological ROS: as noted in HPI Dermatological ROS: negative for rash and skin lesion changes  Exam: Current vital signs: BP (!) 192/110 (BP Location: Right Arm)   Pulse (!) 114   Temp 98.2 F (36.8 C) (Oral)   Resp 18   Ht 5' (1.524 m)   Wt 57.6 kg   LMP 10/06/2019   SpO2 100%   BMI 24.80 kg/m  Vital signs in last 24 hours: Temp:  [98.2 F (36.8 C)] 98.2 F (36.8 C) (02/15 1233) Pulse Rate:  [114] 114 (02/15 1233) Resp:  [18] 18 (02/15 1233) BP: (192)/(110) 192/110 (02/15 1233) SpO2:  [100 %] 100 % (02/15 1233) Weight:  [57.6 kg] 57.6 kg (02/15 1232)   Constitutional: Appears well-developed and well-nourished.  Psych: Affect appropriate to situation Eyes: No scleral injection HENT: No OP obstrucion Head: Normocephalic.  Cardiovascular: Normal rate and regular rhythm.  Respiratory: Effort normal, non-labored breathing GI: Soft.  No distension. There is no tenderness.  Skin: WDI  Neuro: Mental Status: Patient is awake, alert, oriented to person, place, month, year, and situation. Speech- naming, repeating,comprehension Patient is able to give a clear and coherent history. Cranial Nerves: II: Visual Fields are full.  Able to count fingers, tell me the color of my glove, but states further out objects are fuzzy III,IV, VI: EOMI without ptosis or diploplia. Pupils equal, round and reactive but only sluggishly to light.  As she  still has dilated pupil secondary to going to the ophthalmologist. V: Facial sensation is symmetric to temperature VII: Facial movement is symmetric.  VIII: hearing is intact to voice X: Palat elevates symmetrically XI: Shoulder shrug is symmetric. XII: tongue is midline without atrophy or fasciculations.  Motor: Tone is normal. Bulk is normal. 5/5 strength was present in all four extremities.  Sensory: Sensation is symmetric to light touch and temperature in the arms and legs. Deep Tendon Reflexes: 3+ and symmetric in the biceps and patellae.  Plantars: Toes are downgoing bilaterally.  Cerebellar: FNF and HKS are intact bilaterally  Labs I have reviewed labs in epic and the results pertinent to this consultation are:   CBC    Component Value Date/Time   WBC 11.3 (H) 10/21/2019 1241   RBC 2.63 (L) 10/21/2019 1241   HGB 8.3 (L) 10/21/2019 1241   HCT 27.7 (L) 10/21/2019 1241   PLT 117 (  L) 10/21/2019 1241   MCV 105.3 (H) 10/21/2019 1241   MCH 31.6 10/21/2019 1241   MCHC 30.0 10/21/2019 1241   RDW 15.0 10/21/2019 1241   LYMPHSABS 0.7 10/18/2019 1202   MONOABS 0.5 10/18/2019 1202   EOSABS 0.2 10/18/2019 1202   BASOSABS 0.0 10/18/2019 1202    CMP     Component Value Date/Time   NA 135 10/21/2019 1241   K 5.9 (H) 10/21/2019 1241   CL 99 10/21/2019 1241   CO2 10 (L) 10/21/2019 1241   GLUCOSE 86 10/21/2019 1241   BUN 119 (H) 10/21/2019 1241   CREATININE 28.74 (H) 10/21/2019 1241   CALCIUM 8.3 (L) 10/21/2019 1241   GFRNONAA 1 (L) 10/21/2019 1241   GFRAA 2 (L) 10/21/2019 1241    Lipid Panel  No results found for: CHOL, TRIG, HDL, CHOLHDL, VLDL, LDLCALC, LDLDIRECT   Imaging I have reviewed the images obtained:  CT-scan of the brain-no CT evidence of acute intracranial abnormality.  Consider brain MRI for further evaluation.  Diffusely increased density of intracranial venous vasculature, suggestive of hemoconcentration/dehydration   Assessment:  This is a  24 year old female who has kidney disease and is on peritoneal dialysis.  Patient has had a 5-day history of headache with progressive loss of vision.  After seeing ophthalmologist today, there was papilledema visualized and concern for pseudotumor cerebri.  Patient was immediately sent to the emergency department.  LP was obtained and showed a opening pressure of 19 mm H2O.   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  She has bilateral papilledema with rapidly progressive visual loss and headache which is certainly concerning for pseudotumor or meningitis.  Her LP did not show an elevated opening pressure which would argue against pseudotumor.  Cells and protein in the CSF are normal which would argue strongly against meningitis.  At this point, I think 1 thing that is a strong consideration would be the fact that she has been severely hypertensive.  Malignant hypertension can present with bilateral papilledema and I would favor aggresively controlling BP.   I will also order further imaging to rule out other causes.   MRI brain, orbits, MRV head  Roland Rack, MD [facility] [phone]  If 7pm- 7am, please page neurology on call as listed in [place].

## 2019-10-21 NOTE — ED Notes (Signed)
This RN attempted to call report to 3W; no answer at this time; will reattempt in 5-10 minutes.

## 2019-10-21 NOTE — Consult Note (Addendum)
[facility];  Reason for Consult: To manage dialysis and dialysis related needs Referring Physician: Dr Dareen Piano, NIschal  HPI:  Tara Mills is an 24 y.o. female with history of hypertension, anemia, ESRD with failed kidney transplant, secondary hyperparathyroidism, admitted for the evaluation of visual loss, seen as a consultation for the management of ESRD. Patient follows at [facility] for dialysis.  She was recently started on peritoneal dialysis.  She still has right IJ TDC.  Because of her vision problem the plan now is to switch to insulin and hemodialysis.  She was evaluated at [facility] ER on 2/12 with no acute finding noted on CT angio of head and neck.  She was discharged to follow-up with ophthalmology who saw the patient today.  She was directed to: ER for initial concern of meningitis versus pseudotumor cerebri.  Neurology was consulted. In the ER she was hypertensive, in room air.  The labs showed calcium 5.9, CO2 10, BUN 119 and creatinine 28.74.  Patient stated she was not doing PD for last few days because of problem with vision.  She reports restless leg.  Denies nausea vomiting, chest pain, shortness of breath, headache or dizziness.  Still reports problem with vision.  No fever, chills, diarrhea, constipation or abdominal pain.  Dialyzes at [facility],  CCPD, 4 exchanges, fill 2000 cc, dwell time 1.5 hour, no last fill, EDW 59 kg. Aranesp 200 mcg on 2/2, venofer 100 mg post HD.  Calcitriol 0.5 mcg 3x/week sensipar 30 mg daily auryxia 2 tabs TID  Past Medical History:  Diagnosis Date  . Anemia   . Renal disorder     Past Surgical History:  Procedure Laterality Date  . NEPHRECTOMY TRANSPLANTED ORGAN     2007    Family History  Problem Relation Age of Onset  . Hypertension Mother   . Hypertension Father     Social History:  reports that she has never smoked. She has never used smokeless tobacco. She reports previous alcohol use. She  reports that she does not use drugs.  Allergies:  Allergies  Allergen Reactions  . Nsaids Anaphylaxis and Other (See Comments)    Due to ESRD   . Grapefruit Extract Other (See Comments)    Per pt: can't have because of medications    Medications: I have reviewed the patient's current medications.   Results for orders placed or performed during the hospital encounter of 10/21/19 (from the past 48 hour(s))  CBC     Status: Abnormal   Collection Time: 10/21/19 12:41 PM  Result Value Ref Range   WBC 11.3 (H) 4.0 - 10.5 K/uL   RBC 2.63 (L) 3.87 - 5.11 MIL/uL   Hemoglobin 8.3 (L) 12.0 - 15.0 g/dL   HCT 27.7 (L) 36.0 - 46.0 %   MCV 105.3 (H) 80.0 - 100.0 fL   MCH 31.6 26.0 - 34.0 pg   MCHC 30.0 30.0 - 36.0 g/dL   RDW 15.0 11.5 - 15.5 %   Platelets 117 (L) 150 - 400 K/uL    Comment: REPEATED TO VERIFY PLATELET COUNT CONFIRMED BY SMEAR Immature Platelet Fraction may be clinically indicated, consider ordering this additional test [id number]    nRBC 0.0 0.0 - 0.2 %    Comment: Performed at [facility], [place] [address], [place], [place] [id number]  Basic metabolic panel     Status: Abnormal   Collection Time: 10/21/19 12:41 PM  Result Value Ref Range   Sodium 135 135 - 145  mmol/L   Potassium 5.9 (H) 3.5 - 5.1 mmol/L   Chloride 99 98 - 111 mmol/L   CO2 10 (L) 22 - 32 mmol/L   Glucose, Bld 86 70 - 99 mg/dL   BUN 119 (H) 6 - 20 mg/dL   Creatinine, Ser 28.74 (H) 0.44 - 1.00 mg/dL    Comment: RESULTS CONFIRMED BY MANUAL DILUTION   Calcium 8.3 (L) 8.9 - 10.3 mg/dL   GFR calc non Af Amer 1 (L) >60 mL/min   GFR calc Af Amer 2 (L) >60 mL/min   Anion gap 26 (H) 5 - 15    Comment: Performed at [facility] [address] [place], [place] [address]  Protime-INR     Status: None   Collection Time: 10/21/19  1:05 PM  Result Value Ref Range   Prothrombin Time 14.9 11.4 - 15.2 seconds   INR 1.2 0.8 - 1.2    Comment: (NOTE) INR goal varies based on device and disease  states. Performed at [facility], [place] [address], [place], [place] [address]   Respiratory Panel by RT PCR (Flu A&B, Covid) - Nasopharyngeal Swab     Status: None   Collection Time: 10/21/19  1:20 PM   Specimen: Nasopharyngeal Swab  Result Value Ref Range   SARS Coronavirus 2 by RT PCR NEGATIVE NEGATIVE    Comment: (NOTE) SARS-CoV-2 target nucleic acids are NOT DETECTED. The SARS-CoV-2 RNA is generally detectable in upper respiratoy specimens during the acute phase of infection. The lowest concentration of SARS-CoV-2 viral copies this assay can detect is 131 copies/mL. A negative result does not preclude SARS-Cov-2 infection and should not be used as the sole basis for treatment or other patient management decisions. A negative result may occur with  improper specimen collection/handling, submission of specimen other than nasopharyngeal swab, presence of viral mutation(s) within the areas targeted by this assay, and inadequate number of viral copies (<131 copies/mL). A negative result must be combined with clinical observations, patient history, and epidemiological information. The expected result is Negative. Fact Sheet for Patients:  PinkCheek.be Fact Sheet for Healthcare Providers:  GravelBags.it This test is not yet ap proved or cleared by the [place] FDA and  has been authorized for detection and/or diagnosis of SARS-CoV-2 by FDA under an Emergency Use Authorization (EUA). This EUA will remain  in effect (meaning this test can be used) for the duration of the COVID-19 declaration under Section 564(b)(1) of the Act, 21 U.S.C. section 360bbb-3(b)(1), unless the authorization is terminated or revoked sooner.    Influenza A by PCR NEGATIVE NEGATIVE   Influenza B by PCR NEGATIVE NEGATIVE    Comment: (NOTE) The Xpert Xpress SARS-CoV-2/FLU/RSV assay is intended as an aid in  the diagnosis of influenza from  Nasopharyngeal swab specimens and  should not be used as a sole basis for treatment. Nasal washings and  aspirates are unacceptable for Xpert Xpress SARS-CoV-2/FLU/RSV  testing. Fact Sheet for Patients: PinkCheek.be Fact Sheet for Healthcare Providers: GravelBags.it This test is not yet approved or cleared by the [place] FDA and  has been authorized for detection and/or diagnosis of SARS-CoV-2 by  FDA under an Emergency Use Authorization (EUA). This EUA will remain  in effect (meaning this test can be used) for the duration of the  Covid-19 declaration under Section 564(b)(1) of the Act, 21  U.S.C. section 360bbb-3(b)(1), unless the authorization is  terminated or revoked. Performed at [facility], [place],  [place] [address]   CSF cell count with differential     Status: None   Collection Time: 10/21/19  3:02 PM  Result Value Ref Range   Tube # 3    Color, CSF COLORLESS COLORLESS   Appearance, CSF CLEAR CLEAR   Supernatant NOT INDICATED    RBC Count, CSF 0 0 /cu mm   WBC, CSF 1 0 - 5 /cu mm   Other Cells, CSF TOO FEW TO COUNT, SMEAR AVAILABLE FOR REVIEW     Comment: FEW LYMPHOCYTES AND FEW MONOCYTES NOTED Performed at [facility] [address], [place], [place] [address]   Protein and glucose, CSF     Status: None   Collection Time: 10/21/19  3:02 PM  Result Value Ref Range   Glucose, CSF 50 40 - 70 mg/dL   Total  Protein, CSF 30 15 - 45 mg/dL    Comment: Performed at [facility] [address] [place], [place] [address]  Cryptococcal antigen, CSF     Status: None   Collection Time: 10/21/19  3:02 PM  Result Value Ref Range   Crypto Ag NEGATIVE NEGATIVE   Cryptococcal Ag Titer NOT INDICATED NOT INDICATED    Comment: Performed at [facility], [place] [address], [place], [place] [address]  CSF culture with Stat gram stain     Status: None (Preliminary result)    Collection Time: 10/21/19  3:24 PM   Specimen: CSF; Cerebrospinal Fluid  Result Value Ref Range   Specimen Description CSF    Special Requests Immunocompromised    Gram Stain      WBC PRESENT, PREDOMINANTLY MONONUCLEAR NO ORGANISMS SEEN CYTOSPIN SMEAR Performed at [facility], [place] [address], [place], [place] [address]    Culture PENDING    Report Status PENDING     CT Head Wo Contrast  Result Date: 10/21/2019 CLINICAL DATA:  Concern for papilledema; vision loss, binocular. Additional history provided: Patient arrives from eye doctor with report of bilateral papilledema, patient seen previously on the 12 due to recent loss of vision. EXAM: CT HEAD WITHOUT CONTRAST TECHNIQUE: Contiguous axial images were obtained from the base of the skull through the vertex without intravenous contrast. COMPARISON:  CT angiogram head 10/18/2019 FINDINGS: Brain: No evidence of acute intracranial hemorrhage. No demarcated cortical infarction. No evidence of intracranial mass. No midline shift or extra-axial fluid collection. Cerebral volume is normal for age. There is diffusely increased density of the intracranial venous vasculature, suggestive of hemoconcentration/dehydration. Vascular: No focal hyperdensity identified within the proximal large arterial vessels. Skull: Normal. Negative for fracture or focal lesion. Sinuses/Orbits: Visualized orbits demonstrate no acute abnormality. No significant paranasal sinus disease or mastoid effusion. IMPRESSION: No CT evidence of acute intracranial abnormality. Consider brain MRI for further evaluation. Diffusely increased density of the intracranial venous vasculature, suggestive of hemoconcentration/dehydration. Electronically Signed   By: Kellie Simmering DO   On: 10/21/2019 14:58   DG Chest Port 1 View  Result Date: 10/21/2019 CLINICAL DATA:  Cough and shortness of breath EXAM: PORTABLE CHEST 1 VIEW COMPARISON:  July 05, 2019 FINDINGS: There is ill-defined  airspace opacity in the left mid lung. Lungs elsewhere clear. There is cardiomegaly with pulmonary vascularity normal. No adenopathy. Central catheter present with tips in superior vena cava. No pneumothorax. No bone lesions. IMPRESSION: Ill-defined opacity left mid lung, a likely focus of pneumonia. Lungs elsewhere clear. Cardiomegaly.  Central catheter as described. Electronically Signed   By: Lowella Grip  III M.D.   On: 10/21/2019 13:41    ROS: As per H&P, other system reviewed and are negative. Blood pressure (!) 158/114, pulse (!) 103, temperature 98.2 F (36.8 C), temperature source Oral, resp. rate 11, height 5' (1.524 m), weight 57.6 kg, last menstrual period 10/06/2019, SpO2 96 %. Physical examination: General: Sitting on bed comfortable, not in distress Neck: No JVD, no neck mass Respiratory: Bilateral clear, no wheezing or crackle appreciated Cardiovascular: Regular rate rhythm, S1-S2 normal Abdomen/GI: Abdomen soft, nontender, nondistended.  PD catheter site clean Extremities: No lower extremity edema, no cyanosis Skin: No rash or ulcer noted Neurology: Alert awake and following commands, nonfocal Vascular access: Right IJ TDC and has abdomen PD catheter.  Assessment/Plan: 1 Vision loss: Imaging studies with no acute finding.  Neurology is following.  Plan for MRI of brain.  LP has been done today.  2 ESRD: Was on PD recently however plan to switch to HD because of poor vision and unable to perform PD.  I will order PD for tonight if patient can come up to the floor.  Starting hemodialysis from tomorrow.  She needs better clearance and will need serial treatment to avoid disequilibrium syndrome. The plan is to resume in center HD once she is released from the hospital.  I have discussed with the patient.  3 Hypertension: BP elevated.  Resume home medication.  Monitor blood pressure.  4. Anemia of ESRD: Hemoglobin stable, below goal.  Order iron and Aranesp per tomorrow.  5.  Metabolic Bone Disease: Resume Sensipar and Auryxia.  6.  Hyperkalemia: Ordered Milly Jakob and medical treatment.  7.  Metabolic acidosis: Received bicarbonate.  I will adjust dialysate fluid bicarb during HD.  Discussed with the primary team.  Rosita Fire 10/21/2019, 7:26 PM

## 2019-10-21 NOTE — ED Notes (Signed)
Transported to CT 

## 2019-10-21 NOTE — Progress Notes (Signed)
Per RN, pt not ready for MRI at this time. TBD later tonight.

## 2019-10-21 NOTE — ED Notes (Signed)
Returned from CT.

## 2019-10-21 NOTE — ED Provider Notes (Signed)
[facility] EMERGENCY DEPARTMENT Provider Note   CSN: [id number] Arrival date & time: 10/21/19  1226     History Chief Complaint  Patient presents with  . Loss of Vision    Tara Mills is a 24 y.o. female.  Patient is a 24 year old female who presents as a referral from ophthalmology with papilledema.  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  She states she has had some sinus congestion and coughing over the last several days and thought she had a sinus infection with some bifrontal headaches and some vision changes in her right eye.  She was seen here in the ED on February 12 for headache associated with vision changes in her right eye which she describes as different like color flashes.  She had a CT and CTA of her head and neck which showed no acute abnormality.  She says the vision changes of gotten worse and now she cannot read her phone nor really see much of anything.  She says everything looks fuzzy.  She has a mild headache but says it is not very bad.  She has not been able to do her dialysis in about 3 days because she cannot see to connect it.  She has had some ongoing nasal congestion coughing and some shortness of breath.  No known fevers.  She had a Covid test done yesterday but does not have the results.  This was done through urgent care.  She was referred to ophthalmology given her vision changes and they noted her to have papilledema of both eyes and sent her here for further evaluation.        Past Medical History:  Diagnosis Date  . Anemia   . Renal disorder     There are no problems to display for this patient.   Past Surgical History:  Procedure Laterality Date  . NEPHRECTOMY TRANSPLANTED ORGAN     2007     OB History   No obstetric history on file.     Family History  Problem Relation Age of Onset  . Hypertension Mother   . Hypertension  Father     Social History   Tobacco Use  . Smoking status: Never Smoker  . Smokeless tobacco: Never Used  Substance Use Topics  . Alcohol use: Not Currently  . Drug use: Never    Home Medications Prior to Admission medications   Medication Sig Start Date End Date Taking? Authorizing Provider  amLODipine (NORVASC) 10 MG tablet Take 10 mg by mouth daily. 06/04/19  Yes [provider]  amoxicillin-clavulanate (AUGMENTIN) 875-125 MG tablet Take 1 tablet by mouth daily. Take for 10 days 10/20/19 10/30/19 Yes [provider]  B Complex-C-Folic Acid (DIALYVITE Q000111Q) 0.8 MG TABS Take 1 tablet by mouth at bedtime. 09/23/19  Yes [provider]  calcitRIOL (ROCALTROL) 0.5 MCG capsule Take 1.5 mcg by mouth 3 (three) times a week. Monday, Wednesday, Friday 10/03/19  Yes [provider]  CELLCEPT 500 MG tablet Take 1,000 mg by mouth 2 (two) times daily. 06/06/19  Yes [provider]  cinacalcet (SENSIPAR) 30 MG tablet Take 30 mg by mouth daily. 10/17/19  Yes [provider]  CLARITIN 10 MG tablet Take 10 mg by mouth daily as needed for allergies.  09/18/19  Yes [provider]  diphenhydrAMINE (BENADRYL) 25 mg capsule Take 25 mg by mouth as needed for allergies or sleep.  Yes [provider]  DULCOLAX 5 MG EC tablet Take 5 mg by mouth at bedtime as needed for mild constipation.  10/08/19  Yes [provider]  famotidine (PEPCID) 20 MG tablet Take 20 mg by mouth at bedtime.   Yes [provider]  ferric citrate (AURYXIA) 1 GM 210 MG(Fe) tablet Take 420 mg by mouth 3 (three) times daily with meals. 04/08/19  Yes [provider]  gentamicin cream (GARAMYCIN) 0.1 % Apply 1 application topically daily.  09/03/19  Yes [provider]  hydrALAZINE (APRESOLINE) 25 MG tablet Take 25 mg by mouth 2 (two) times daily. 09/23/19  Yes [provider]  lactulose (CHRONULAC) 10 GM/15ML solution Take 30 mLs by mouth  as needed for mild constipation.  10/08/19  Yes [provider]  losartan (COZAAR) 100 MG tablet Take 100 mg by mouth at bedtime. 09/05/19  Yes [provider]  MIRALAX 17 GM/SCOOP powder Take 1 Container by mouth as needed for mild constipation.  10/08/19  Yes [provider]  NAC 600 600 MG CAPS Take 600 mg by mouth daily. 09/18/19  Yes [provider]  tacrolimus (PROGRAF) 1 MG capsule Take 2 mg by mouth 2 (two) times daily. 09/04/19  Yes [provider]    Allergies    Nsaids and Grapefruit extract  Review of Systems   Review of Systems  Constitutional: Positive for fatigue. Negative for chills, diaphoresis and fever.  HENT: Positive for congestion and rhinorrhea. Negative for sneezing.   Eyes: Positive for visual disturbance.  Respiratory: Positive for cough and shortness of breath. Negative for chest tightness.   Cardiovascular: Negative for chest pain and leg swelling.  Gastrointestinal: Negative for abdominal pain, blood in stool, diarrhea, nausea and vomiting.  Genitourinary: Negative for difficulty urinating, flank pain, frequency and hematuria.  Musculoskeletal: Negative for arthralgias and back pain.  Skin: Negative for rash.  Neurological: Positive for headaches. Negative for dizziness, speech difficulty, weakness and numbness.    Physical Exam Updated Vital Signs BP (!) 192/110 (BP Location: Right Arm)   Pulse (!) 114   Temp 98.2 F (36.8 C) (Oral)   Resp 18   Ht 5' (1.524 m)   Wt 57.6 kg   LMP 10/06/2019   SpO2 100%   BMI 24.80 kg/m   Physical Exam Constitutional:      Appearance: She is well-developed.  HENT:     Head: Normocephalic and atraumatic.  Eyes:     Pupils: Pupils are equal, round, and reactive to light.  Cardiovascular:     Rate and Rhythm: Normal rate and regular rhythm.     Heart sounds: Normal heart sounds.  Pulmonary:     Effort: Pulmonary effort is normal. No respiratory distress.     Breath  sounds: Normal breath sounds. No wheezing or rales.  Chest:     Chest wall: No tenderness.  Abdominal:     General: Bowel sounds are normal.     Palpations: Abdomen is soft.     Tenderness: There is no abdominal tenderness. There is no guarding or rebound.  Musculoskeletal:        General: Normal range of motion.     Cervical back: Normal range of motion and neck supple.  Lymphadenopathy:     Cervical: No cervical adenopathy.  Skin:    General: Skin is warm and dry.     Findings: No rash.  Neurological:     Mental Status: She is alert and oriented to person, place,  and time.     Comments: Motor 5/5 all extremities Sensation grossly intact to LT all extremities Finger to Nose intact, no pronator drift CN II-XII grossly intact        ED Results / Procedures / Treatments   Labs (all labs ordered are listed, but only abnormal results are displayed) Labs Reviewed  CBC - Abnormal; Notable for the following components:      Result Value   WBC 11.3 (*)    RBC 2.63 (*)    Hemoglobin 8.3 (*)    HCT 27.7 (*)    MCV 105.3 (*)    Platelets 117 (*)    All other components within normal limits  BASIC METABOLIC PANEL - Abnormal; Notable for the following components:   Potassium 5.9 (*)    CO2 10 (*)    BUN 119 (*)    Creatinine, Ser 28.74 (*)    Calcium 8.3 (*)    GFR calc non Af Amer 1 (*)    GFR calc Af Amer 2 (*)    Anion gap 26 (*)    All other components within normal limits  RESPIRATORY PANEL BY RT PCR (FLU A&B, COVID)  CSF CULTURE  ACID FAST SMEAR (AFB, MYCOBACTERIA)  ACID FAST CULTURE WITH REFLEXED SENSITIVITIES (MYCOBACTERIA)  PROTIME-INR  CSF CELL COUNT WITH DIFFERENTIAL  PROTEIN AND GLUCOSE, CSF  CRYPTOCOCCAL ANTIGEN, CSF    EKG None  Radiology CT Head Wo Contrast  Result Date: 10/21/2019 CLINICAL DATA:  Concern for papilledema; vision loss, binocular. Additional history provided: Patient arrives from eye doctor with report of bilateral papilledema, patient  seen previously on the 12 due to recent loss of vision. EXAM: CT HEAD WITHOUT CONTRAST TECHNIQUE: Contiguous axial images were obtained from the base of the skull through the vertex without intravenous contrast. COMPARISON:  CT angiogram head 10/18/2019 FINDINGS: Brain: No evidence of acute intracranial hemorrhage. No demarcated cortical infarction. No evidence of intracranial mass. No midline shift or extra-axial fluid collection. Cerebral volume is normal for age. There is diffusely increased density of the intracranial venous vasculature, suggestive of hemoconcentration/dehydration. Vascular: No focal hyperdensity identified within the proximal large arterial vessels. Skull: Normal. Negative for fracture or focal lesion. Sinuses/Orbits: Visualized orbits demonstrate no acute abnormality. No significant paranasal sinus disease or mastoid effusion. IMPRESSION: No CT evidence of acute intracranial abnormality. Consider brain MRI for further evaluation. Diffusely increased density of the intracranial venous vasculature, suggestive of hemoconcentration/dehydration. Electronically Signed   By: Kellie Simmering DO   On: 10/21/2019 14:58   DG Chest Port 1 View  Result Date: 10/21/2019 CLINICAL DATA:  Cough and shortness of breath EXAM: PORTABLE CHEST 1 VIEW COMPARISON:  July 05, 2019 FINDINGS: There is ill-defined airspace opacity in the left mid lung. Lungs elsewhere clear. There is cardiomegaly with pulmonary vascularity normal. No adenopathy. Central catheter present with tips in superior vena cava. No pneumothorax. No bone lesions. IMPRESSION: Ill-defined opacity left mid lung, a likely focus of pneumonia. Lungs elsewhere clear. Cardiomegaly.  Central catheter as described. Electronically Signed   By: Lowella Grip III M.D.   On: 10/21/2019 13:41    Procedures .Lumbar Puncture  Date/Time: 10/21/2019 3:25 PM Performed by: Malvin Johns, MD Authorized by: Malvin Johns, MD   Consent:    Consent  obtained:  Written   Consent given by:  Patient   Risks discussed:  Bleeding, infection, nerve damage and pain   Alternatives discussed:  No treatment Pre-procedure details:    Procedure purpose:  Diagnostic  Preparation: Patient was prepped and draped in usual sterile fashion   Anesthesia (see MAR for exact dosages):    Anesthesia method:  Local infiltration   Local anesthetic:  Lidocaine 1% w/o epi Procedure details:    Lumbar space:  L3-L4 interspace   Patient position:  L lateral decubitus   Needle gauge:  22   Needle type:  Diamond point   Needle length (in):  3.5   Ultrasound guidance: no     Number of attempts:  1   Opening pressure (cm H2O):  19   Fluid appearance:  Clear   Tubes of fluid:  4   Total volume (ml):  6 Post-procedure:    Puncture site:  Adhesive bandage applied   (including critical care time)  Medications Ordered in ED Medications  cefTRIAXone (ROCEPHIN) 1 g in sodium chloride 0.9 % 100 mL IVPB (has no administration in time range)  azithromycin (ZITHROMAX) 500 mg in sodium chloride 0.9 % 250 mL IVPB (has no administration in time range)    ED Course  I have reviewed the triage vital signs and the nursing notes.  Pertinent labs & imaging results that were available during my care of the patient were reviewed by me and considered in my medical decision making (see chart for details).    MDM Rules/Calculators/A&P                     Patient is a 24 year old female who is immunosuppressed who presents with vision loss.  She was noted to have papilledema by ophthalmology and sent here for further evaluation.  LP was performed which shows an opening pressure of 19.  CSF was sent for analysis and is pending.  I have consulted Dr. Leonel Ramsay with neurology who will follow the patient.  At this point she does not have other meningeal symptoms and he wanted to hold off on starting antibiotics until results are back.  She did have a repeat head CT which shows  no acute abnormalities.  She does also have respiratory symptoms.  Her Covid test is negative.  Chest x-ray shows unilateral focus of pneumonia.  She was started on IV Rocephin and Zithromax which was sexually started after the LP was performed.  I spoke with the internal medicine teaching service to admit the patient for further evaluation.  CRITICAL CARE Performed by: Malvin Johns Total critical care time: 60 minutes Critical care time was exclusive of separately billable procedures and treating other patients. Critical care was necessary to treat or prevent imminent or life-threatening deterioration. Critical care was time spent personally by me on the following activities: development of treatment plan with patient and/or surrogate as well as nursing, discussions with consultants, evaluation of patient's response to treatment, examination of patient, obtaining history from patient or surrogate, ordering and performing treatments and interventions, ordering and review of laboratory studies, ordering and review of radiographic studies, pulse oximetry and re-evaluation of patient's condition.  Final Clinical Impression(s) / ED Diagnoses Final diagnoses:  Visual loss  Papilledema  Community acquired pneumonia, unspecified laterality    Rx / DC Orders ED Discharge Orders    None       Malvin Johns, MD 10/21/19 1546

## 2019-10-21 NOTE — ED Notes (Signed)
CT notified of need for scan.  

## 2019-10-21 NOTE — ED Notes (Signed)
This RN requested this patients medications be verified by pharmacy.

## 2019-10-21 NOTE — Progress Notes (Signed)
Patient ID: Tara Mills, female   DOB: 06-30-1996, 24 y.o.   MRN: [id number]   I saw this patient this morning at my office.  Referred by [facility] ER.  She was sent for headache and vision loss that started during dialysis last week.  The patient's vision was severely diminished.  [facility] OD: 20/200   OS: 20/400  Dilated eye exam showed florid bilateral optic nerve edema.  She had recent CTA (10/18/2019) that reportedly did not show an sinus thrombosis.  Therefore, I feel patient could have meningitis and needs to be considered for urgent LP.  Lower on differential diagnosis is pseudotumor cerebri.  I called Neurologist on call (Dr. Leonel Ramsay) and altered him.  I sent patient directly to [facility] ER.  I spoke to the charge nurse at [facility] ER so they will be expecting her.

## 2019-10-21 NOTE — ED Triage Notes (Signed)
Pt arrives from eye doc with reports of bilateral papilledema. Pt was seen here on the 12th due to recent loss of vision.

## 2019-10-21 NOTE — H&P (Signed)
Date: 10/21/2019               Patient Name:  Tara Mills MRN: [id number]  DOB: 29-May-1996 Age / Sex: 24 y.o., female   PCP: Patient, No Pcp Per         Medical Service: Internal Medicine Teaching Service         Attending Physician: Dr. Aldine Contes, MD    First Contact: Marva Panda, MD, [place] Pager: [place] [phone])  Second Contact: Berline Lopes, MD, [place] Pager: [place] [phone])       After Hours (After 5p/  First Contact Pager: [phone]  weekends / holidays): Second Contact Pager: [phone]   Chief Complaint: vision loss  History of Present Illness: 24 y.o. yo female w/ PMHx significant for ESRD on PD and anemia of chronic disease presenting with five days of increasing headache and decreased vision. She notes that she initially started experiencing vision changes on 2/10 which she describes as intermittent spots in her vision fields, changes in perception of color and blurry vision to where she is unable to see her phone screen clearly. She was evaluated in the ED on 2/12 and no acute findings noted on CT and CT Angio of head and neck. Patient discharged to follow up with neurology and ophthalmology. Patient presented to ophthalmologist office today with severely diminished vision. Dilated eye exam with florid bilateral optic nerve edema. Patient sent to [facility] ER for concerns of meningitis vs pseudotumor cerebri. Patient notes ongoing headache but describes it as mild, she notes continued decreased vision with decreasing color perception. She notes ongoing nausea and one episode of nonbloody, nonbilious emesis earlier today. No known fevers or chills, chest pain, shortness of breath, abdominal pain, or focal weakness noted.  Patient notes that over the weekend, she developed nasal congestion with rhinorrhea and epistaxis and mild frontal/ethmoid headache and cough for which she presented to an urgent care and was prescribed amoxicillin for sinusitis.   Patient presented to the ED  from ophthalmologist office for evaluation of papilledema. LP performed in the ED with opening pressure of 19. CSF sent for analysis. Repeat head CT negative. Neurology following but as no other meningeal signs, will await results prior to starting antibiotics. CXR concerning for unilateral focus of PNA for which she was started on IV Rocephin and Azithromycin. Patient admitted for further evaluation and management.    Meds:  Current Meds  Medication Sig  . amLODipine (NORVASC) 10 MG tablet Take 10 mg by mouth daily.  Marland Kitchen amoxicillin-clavulanate (AUGMENTIN) 875-125 MG tablet Take 1 tablet by mouth daily. Take for 10 days  . B Complex-C-Folic Acid (DIALYVITE Q000111Q) 0.8 MG TABS Take 1 tablet by mouth at bedtime.  . calcitRIOL (ROCALTROL) 0.5 MCG capsule Take 1.5 mcg by mouth 3 (three) times a week. Monday, Wednesday, Friday  . CELLCEPT 500 MG tablet Take 1,000 mg by mouth 2 (two) times daily.  . cinacalcet (SENSIPAR) 30 MG tablet Take 30 mg by mouth daily.  Marland Kitchen CLARITIN 10 MG tablet Take 10 mg by mouth daily as needed for allergies.   . diphenhydrAMINE (BENADRYL) 25 mg capsule Take 25 mg by mouth as needed for allergies or sleep.  . DULCOLAX 5 MG EC tablet Take 5 mg by mouth at bedtime as needed for mild constipation.   . famotidine (PEPCID) 20 MG tablet Take 20 mg by mouth at bedtime.  . ferric citrate (AURYXIA) 1 GM 210 MG(Fe) tablet Take 420 mg by mouth 3 (three) times daily with  meals.  . gentamicin cream (GARAMYCIN) 0.1 % Apply 1 application topically daily.   . hydrALAZINE (APRESOLINE) 25 MG tablet Take 25 mg by mouth 2 (two) times daily.  Marland Kitchen lactulose (CHRONULAC) 10 GM/15ML solution Take 30 mLs by mouth as needed for mild constipation.   Marland Kitchen losartan (COZAAR) 100 MG tablet Take 100 mg by mouth at bedtime.  Marland Kitchen MIRALAX 17 GM/SCOOP powder Take 1 Container by mouth as needed for mild constipation.   Marland Kitchen NAC 600 600 MG CAPS Take 600 mg by mouth daily.  . tacrolimus (PROGRAF) 1 MG capsule Take 2 mg by  mouth 2 (two) times daily.     Allergies: Allergies as of 10/21/2019 - Review Complete 10/21/2019  Allergen Reaction Noted  . Nsaids Anaphylaxis and Other (See Comments) 04/22/2019  . Grapefruit extract Other (See Comments) 10/18/2019   Past Medical History:  Diagnosis Date  . Anemia   . Renal disorder     Family History:  Family History  Problem Relation Age of Onset  . Hypertension Mother   . Hypertension Father      Social History:  Social History   Tobacco Use  . Smoking status: Never Smoker  . Smokeless tobacco: Never Used  Substance Use Topics  . Alcohol use: Not Currently  . Drug use: Never     Review of Systems: A complete ROS was negative except as per HPI.   Physical Exam: Blood pressure (!) 181/125, pulse (!) 111, temperature 98.2 F (36.8 C), temperature source Oral, resp. rate 18, height 5' (1.524 m), weight 57.6 kg, last menstrual period 10/06/2019, SpO2 99 %. Physical Exam Vitals and nursing note reviewed.  Constitutional:      General: She is not in acute distress.    Appearance: Normal appearance. She is not ill-appearing or diaphoretic.  HENT:     Head: Normocephalic and atraumatic.     Mouth/Throat:     Mouth: Mucous membranes are moist.     Pharynx: Oropharynx is clear.  Eyes:     General: No scleral icterus.       Right eye: No discharge.        Left eye: No discharge.     Extraocular Movements: Extraocular movements intact.     Conjunctiva/sclera: Conjunctivae normal.     Pupils: Pupils are equal, round, and reactive to light.  Cardiovascular:     Rate and Rhythm: Normal rate and regular rhythm.     Pulses: Normal pulses.     Heart sounds: Normal heart sounds. No murmur. No friction rub. No gallop.   Pulmonary:     Effort: Pulmonary effort is normal. No respiratory distress.     Breath sounds: Normal breath sounds. No stridor. No wheezing, rhonchi or rales.  Abdominal:     General: Abdomen is flat. Bowel sounds are normal. There  is no distension.     Palpations: Abdomen is soft.     Tenderness: There is no abdominal tenderness. There is no guarding.  Musculoskeletal:        General: No swelling, deformity or signs of injury. Normal range of motion.     Cervical back: Normal range of motion and neck supple. No rigidity or tenderness.  Skin:    General: Skin is warm and dry.     Capillary Refill: Capillary refill takes less than 2 seconds.  Neurological:     General: No focal deficit present.     Mental Status: She is alert and oriented to person, place, and time. Mental  status is at baseline.     Cranial Nerves: No cranial nerve deficit.     Sensory: No sensory deficit.     Motor: No weakness.     Deep Tendon Reflexes: Reflexes normal.    CBC    Component Value Date/Time   WBC 11.3 (H) 10/21/2019 1241   RBC 2.63 (L) 10/21/2019 1241   HGB 8.3 (L) 10/21/2019 1241   HCT 27.7 (L) 10/21/2019 1241   PLT 117 (L) 10/21/2019 1241   MCV 105.3 (H) 10/21/2019 1241   MCH 31.6 10/21/2019 1241   MCHC 30.0 10/21/2019 1241   RDW 15.0 10/21/2019 1241   LYMPHSABS 0.7 10/18/2019 1202   MONOABS 0.5 10/18/2019 1202   EOSABS 0.2 10/18/2019 1202   BASOSABS 0.0 10/18/2019 1202   CMP     Component Value Date/Time   NA 135 10/21/2019 1241   K 5.9 (H) 10/21/2019 1241   CL 99 10/21/2019 1241   CO2 10 (L) 10/21/2019 1241   GLUCOSE 86 10/21/2019 1241   BUN 119 (H) 10/21/2019 1241   CREATININE 28.74 (H) 10/21/2019 1241   CALCIUM 8.3 (L) 10/21/2019 1241   GFRNONAA 1 (L) 10/21/2019 1241   GFRAA 2 (L) 10/21/2019 1241   CSF fluid:  Colorless, clear  Glucose 50 Total protein: 30 Cell count:  RBC - 0  WBC - 1 Gram stain: WBC present, predominantly mononuclear, no organisms seen Cx pending   Cryptococcal Ag: negative  AFB in process  EKG: sinus tachycardia, HR 125, QTc 482  CXR: personally reviewed my interpretation is bilateral opacification noted, cardiomegaly; central catheter w/tip in SVC  CT HEAD WO CONTRAST:    IMPRESSION: No CT evidence of acute intracranial abnormality. Consider brain MRI for further evaluation. Diffusely increased density of the intracranial venous vasculature, suggestive of hemoconcentration/dehydration.  Assessment & Plan by Problem: Tara Mills is a 24year old female with history of ESRD on PD and anemia of chronic disease presenting with bilateral vision loss, noted to have papilledema on ophthalmologic exam.   Bilateral vision loss:  Patient notes progressively worsening vision loss with floaters in bilateral visual fields and altered color perception with blurry vision since 2/10. Prior CT and CTA Head/neck unremarkable. She was evaluated by ophthalmologist today and noted to have papilledema. She also notes to have nausea/vomiting. Given her immunocompromised status, concerns for infectious etiology for which patient sent to [facility] ER. LP performed with normal cell count, glucose and protein levels. Gram stain with mostly WBCs, no organisms identified. Patient afebrile. No focal deficits noted on neurologic exam. However, patient noted to be significantly hypertensive and concerns for hypertensive emergency given SBP >180 and DBP >120 with papilledema and nausea/vomiting.   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 She has a port through which she dialyzes nightly. Patient notes that she has not been able to do her PD over the past several nights due to not being able to see the ports clearly. Patient noted to have significant anion gap of 26 w/CO2 10. BUN/Cr 119/28.74.  - Nephrology consulted for PD, appreciate their  recommendations - Sodium bicarb 121mEq IV once - Sodium bicarb  1300mg  bid  - Calcitriol 1.32mcg MWF - Cinacalcet 30mg  daily - Tacrolimus 3mg  bid   CAP:  Patient notes that she has had nasal congestion with rhinorrhea and epistaxis and a ethmoid/frontal headache over the past few days. She notes that she presented to an urgent care and was prescribed amoxicillin for presumed sinusitis. CXR with multifocal opacities concerning for CAP with leukocytosis in setting of immunocompromised. Patient started on azithromycin and ceftriaxone in the ED.  - Azithromycin 250mg  daily - Ceftriaxone 1g q24h - CBC daily   FEN/GI: Diet: Renal  Fluids: None Electrolytes: Monitor and replete prn  VTE Prophylaxis: Heparin 5000U q8h  Code status: FULL   Dispo: Admit patient to Inpatient with expected length of stay greater than 2 midnights.  Signed: [name], MD  Internal Medicine, PGY-1 Pager: [phone] 10/21/2019, 5:23 PM

## 2019-10-22 DIAGNOSIS — D649 Anemia, unspecified: Secondary | ICD-10-CM

## 2019-10-22 DIAGNOSIS — H4710 Unspecified papilledema: Secondary | ICD-10-CM

## 2019-10-22 DIAGNOSIS — Z9889 Other specified postprocedural states: Secondary | ICD-10-CM

## 2019-10-22 LAB — COMPREHENSIVE METABOLIC PANEL
ALT: 13 U/L (ref 0–44)
AST: 12 U/L — ABNORMAL LOW (ref 15–41)
Albumin: 2.6 g/dL — ABNORMAL LOW (ref 3.5–5.0)
Alkaline Phosphatase: 106 U/L (ref 38–126)
Anion gap: 24 — ABNORMAL HIGH (ref 5–15)
BUN: 127 mg/dL — ABNORMAL HIGH (ref 6–20)
CO2: 10 mmol/L — ABNORMAL LOW (ref 22–32)
Calcium: 7.7 mg/dL — ABNORMAL LOW (ref 8.9–10.3)
Chloride: 101 mmol/L (ref 98–111)
Creatinine, Ser: 39.17 mg/dL — ABNORMAL HIGH (ref 0.44–1.00)
Glucose, Bld: 107 mg/dL — ABNORMAL HIGH (ref 70–99)
Potassium: 5.5 mmol/L — ABNORMAL HIGH (ref 3.5–5.1)
Sodium: 135 mmol/L (ref 135–145)
Total Bilirubin: 1 mg/dL (ref 0.3–1.2)
Total Protein: 6 g/dL — ABNORMAL LOW (ref 6.5–8.1)

## 2019-10-22 LAB — PREPARE RBC (CROSSMATCH)

## 2019-10-22 LAB — ACID FAST SMEAR (AFB, MYCOBACTERIA): Acid Fast Smear: NEGATIVE

## 2019-10-22 LAB — CBC
HCT: 21.2 % — ABNORMAL LOW (ref 36.0–46.0)
Hemoglobin: 6.5 g/dL — CL (ref 12.0–15.0)
MCH: 31.4 pg (ref 26.0–34.0)
MCHC: 30.7 g/dL (ref 30.0–36.0)
MCV: 102.4 fL — ABNORMAL HIGH (ref 80.0–100.0)
Platelets: 102 10*3/uL — ABNORMAL LOW (ref 150–400)
RBC: 2.07 MIL/uL — ABNORMAL LOW (ref 3.87–5.11)
RDW: 14.7 % (ref 11.5–15.5)
WBC: 10.5 10*3/uL (ref 4.0–10.5)
nRBC: 0 % (ref 0.0–0.2)

## 2019-10-22 LAB — PHOSPHORUS: Phosphorus: >30 mg/dL — ABNORMAL HIGH (ref 2.5–4.6)

## 2019-10-22 LAB — ABO/RH: ABO/RH(D): AB POS

## 2019-10-22 MED ORDER — HYDROMORPHONE HCL 1 MG/ML PO LIQD: 1.0000 mg | Freq: Once | ORAL | Status: DC

## 2019-10-22 MED ORDER — DICLOFENAC SODIUM 1 % EX GEL
2.0000 g | Freq: Four times a day (QID) | CUTANEOUS | Status: DC
  Administered 2019-10-22 – 2019-10-25 (×6): 2 g via TOPICAL
  Filled 2019-10-22: qty 100

## 2019-10-22 MED ORDER — LABETALOL HCL 5 MG/ML IV SOLN
10.0000 mg | INTRAVENOUS | Status: DC | PRN
  Administered 2019-10-22 – 2019-10-23 (×4): 10 mg via INTRAVENOUS
  Filled 2019-10-22 (×5): qty 4

## 2019-10-22 MED ORDER — ACETAMINOPHEN 325 MG PO TABS
ORAL_TABLET | ORAL | Status: AC
  Administered 2019-10-22: 650 mg via ORAL
  Filled 2019-10-22: qty 2

## 2019-10-22 MED ORDER — ACETAMINOPHEN 325 MG PO TABS
650.0000 mg | ORAL_TABLET | Freq: Four times a day (QID) | ORAL | Status: DC
  Administered 2019-10-22 – 2019-10-25 (×8): 650 mg via ORAL
  Filled 2019-10-22 (×10): qty 2

## 2019-10-22 MED ORDER — ALTEPLASE 2 MG IJ SOLR: 2.0000 mg | Freq: Once | INTRAMUSCULAR | Status: DC | PRN

## 2019-10-22 MED ORDER — ACETAMINOPHEN 650 MG RE SUPP: 650.0000 mg | Freq: Four times a day (QID) | RECTAL | Status: DC

## 2019-10-22 MED ORDER — HYDROMORPHONE HCL 2 MG PO TABS
2.0000 mg | ORAL_TABLET | Freq: Once | ORAL | Status: AC
  Administered 2019-10-22: 2 mg via ORAL
  Filled 2019-10-22: qty 1

## 2019-10-22 MED ORDER — SODIUM CHLORIDE 0.9 % IV SOLN: 100.0000 mL | INTRAVENOUS | Status: DC | PRN

## 2019-10-22 MED ORDER — LIDOCAINE HCL (PF) 1 % IJ SOLN: 5.0000 mL | INTRAMUSCULAR | Status: DC | PRN

## 2019-10-22 MED ORDER — DARBEPOETIN ALFA 200 MCG/0.4ML IJ SOSY
PREFILLED_SYRINGE | INTRAMUSCULAR | Status: AC
  Administered 2019-10-22: 200 ug via INTRAVENOUS
  Filled 2019-10-22: qty 0.4

## 2019-10-22 MED ORDER — LIDOCAINE-PRILOCAINE 2.5-2.5 % EX CREA
1.0000 "application " | TOPICAL_CREAM | CUTANEOUS | Status: DC | PRN
  Filled 2019-10-22: qty 5

## 2019-10-22 MED ORDER — HEPARIN SODIUM (PORCINE) 1000 UNIT/ML IJ SOLN
INTRAMUSCULAR | Status: AC
  Filled 2019-10-22: qty 4

## 2019-10-22 MED ORDER — SODIUM ZIRCONIUM CYCLOSILICATE 10 G PO PACK
10.0000 g | PACK | Freq: Once | ORAL | Status: AC
  Administered 2019-10-22: 10 g via ORAL
  Filled 2019-10-22: qty 1

## 2019-10-22 MED ORDER — SODIUM CHLORIDE 0.9 % IV SOLN
250.0000 mg | Freq: Four times a day (QID) | INTRAVENOUS | Status: DC
  Administered 2019-10-22 – 2019-10-26 (×16): 250 mg via INTRAVENOUS
  Filled 2019-10-22 (×21): qty 2

## 2019-10-22 MED ORDER — PENTAFLUOROPROP-TETRAFLUOROETH EX AERO: 1.0000 "application " | INHALATION_SPRAY | CUTANEOUS | Status: DC | PRN

## 2019-10-22 MED ORDER — SODIUM CHLORIDE 0.9% IV SOLUTION: Freq: Once | INTRAVENOUS | Status: AC

## 2019-10-22 MED ORDER — HEPARIN SODIUM (PORCINE) 1000 UNIT/ML DIALYSIS
1000.0000 [IU] | INTRAMUSCULAR | Status: DC | PRN
  Filled 2019-10-22: qty 1

## 2019-10-22 NOTE — Progress Notes (Signed)
Called to make hemodialysis unit  know that MD wants pt to have her blood transfused with in hemodialysis. Person that I spoke with named Freda Munro states that she will not be able to come until  Around 12 pm to 1pm.

## 2019-10-22 NOTE — Progress Notes (Signed)
  [facility] Progress Note   Assessment/ Plan:   Dialyzes at [facility],  [facility], 4 exchanges, fill 2000 cc, dwell time 1.5 hour, no last fill, EDW 59 kg. Aranesp 200 mcg on 2/2, venofer 100 mg post HD.  Calcitriol 0.5 mcg 3x/week sensipar 30 mg daily auryxia 2 tabs TID   1 Vision loss: Imaging studies with no acute finding.  Neurology is following.  MRI negative.  LP negative.    2 ESRD: Didn't get PD last night. HD #1 today, will need to treat as a new start as Cr 39 and BUN 127.  Called OP PD RN- pt is not adherent to rx.    3 Hypertension: BP elevated.  Resume home medication.  Monitor blood pressure.  4. Anemia of ESRD: Hemoglobin stable, below goal.  Order iron and Aranesp per tomorrow.  5. Metabolic Bone Disease: Resume Sensipar and Auryxia.  6.  Hyperkalemia: Ordered Lokelma and medical treatment, will stop when on HD  7.  Metabolic acidosis: Received bicarbonate, will stop when on HD.    Subjective:    Seen in room.  Didn't get PD last night d/t staffing issues/ need for MRI.  For HD #1 today   Objective:   BP (!) 144/110 (BP Location: Left Arm)   Pulse 93   Temp 98.4 F (36.9 C) (Oral)   Resp 20   Ht 5' (1.524 m)   Wt 61.1 kg   LMP 10/06/2019   SpO2 95%   BMI 26.31 kg/m   Physical Exam: Gen: young woman, sitting in bed, NAD CVS: RRR no rub Resp: normal WOB clear R lung, L lung with some rhonchi Abd: soft, nontender, NABS Ext: no LE edema ACCESS: PD cath in place, R IJ TDC in place- both dressings are soiled  Labs: BMET Recent Labs  Lab 10/18/19 1202 10/21/19 1241 10/22/19 0250  NA 139 135 135  K 4.6 5.9* 5.5*  CL 101 99 101  CO2 14* 10* 10*  GLUCOSE 99 86 107*  BUN 97* 119* 127*  CREATININE 23.98* 28.74* 39.17*  CALCIUM 8.0* 8.3* 7.7*  PHOS  --   --  >30.0*   CBC Recent Labs  Lab 10/18/19 1202 10/21/19 1241 10/22/19 0250  WBC 6.4 11.3* 10.5  NEUTROABS 5.0  --   --   HGB 8.2* 8.3* 6.5*  HCT 26.1* 27.7* 21.2*   MCV 102.0* 105.3* 102.4*  PLT 163 117* 102*      Medications:    . acetaminophen  650 mg Oral Q6H WA   Or  . acetaminophen  650 mg Rectal Q6H WA  . amLODipine  10 mg Oral Daily  . azithromycin  250 mg Oral Daily  . [START ON 10/23/2019] calcitRIOL  0.5 mcg Oral Once per day on Mon Wed Fri  . Chlorhexidine Gluconate Cloth  6 each Topical Q0600  . cinacalcet  30 mg Oral Q breakfast  . darbepoetin (ARANESP) injection - DIALYSIS  200 mcg Intravenous Q Tue-HD  . famotidine  20 mg Oral QHS  . gentamicin cream  1 application Topical Daily  . hydrALAZINE  25 mg Oral BID  . multivitamin  1 tablet Oral QHS  . mycophenolate  1,000 mg Oral BID  . ramelteon  8 mg Oral QHS  . sodium bicarbonate  1,300 mg Oral BID  . tacrolimus  3 mg Oral BID     Madelon Lips, MD 10/22/2019, 10:53 AM

## 2019-10-22 NOTE — Progress Notes (Signed)
Called to clarify Blood transfusion order. Md states that the one unit of blood should be given while patient is in hemodialysis. Made MD aware of pt complaints of headache and pain in her lower back and that pt is experiencing tachypnea.

## 2019-10-22 NOTE — Progress Notes (Addendum)
Subjective: HD#1 Overnight, patient with nausea/vomiting for which given zofran.She did not receive peritoneal dialysis overnight.   This morning, patient evaluated at bedside. Reviewed negative findings of LP and MRI Plan for HD today. Reports good compliance and no changes recently to her three blood pressure medication.  Has new back pain, worsened with deep breathing.  Denies abdominal pain. All question and concerns adressed.   Objective:  Vital signs in last 24 hours: Vitals:   10/21/19 2015 10/21/19 2100 10/22/19 0107 10/22/19 0511  BP: (!) 145/105 (!) 150/108 (!) 153/109 (!) 148/108  Pulse: (!) 105 (!) 105 (!) 111 (!) 103  Resp: (!) 30 20 18    Temp:  98.1 F (36.7 C) 98.4 F (36.9 C)   TempSrc:  Oral Axillary   SpO2: 93% 95% 97%   Weight:      Height:       CBC Latest Ref Rng & Units 10/22/2019 10/21/2019 10/18/2019  WBC 4.0 - 10.5 K/uL 10.5 11.3(H) 6.4  Hemoglobin 12.0 - 15.0 g/dL 6.5(LL) 8.3(L) 8.2(L)  Hematocrit 36.0 - 46.0 % 21.2(L) 27.7(L) 26.1(L)  Platelets 150 - 400 K/uL 102(L) 117(L) 163   CMP Latest Ref Rng & Units 10/22/2019 10/21/2019 10/18/2019  Glucose 70 - 99 mg/dL 107(H) 86 99  BUN 6 - 20 mg/dL 127(H) 119(H) 97(H)  Creatinine 0.44 - 1.00 mg/dL 39.17(H) 28.74(H) 23.98(H)  Sodium 135 - 145 mmol/L 135 135 139  Potassium 3.5 - 5.1 mmol/L 5.5(H) 5.9(H) 4.6  Chloride 98 - 111 mmol/L 101 99 101  CO2 22 - 32 mmol/L 10(L) 10(L) 14(L)  Calcium 8.9 - 10.3 mg/dL 7.7(L) 8.3(L) 8.0(L)  Total Protein 6.5 - 8.1 g/dL 6.0(L) 6.3(L) -  Total Bilirubin 0.3 - 1.2 mg/dL 1.0 1.0 -  Alkaline Phos 38 - 126 U/L 106 119 -  AST 15 - 41 U/L 12(L) 12(L) -  ALT 0 - 44 U/L 13 13 -   Physical Exam  Constitutional: She is oriented to person, place, and time and well-developed, well-nourished, and in no distress.  HENT:  Head: Normocephalic and atraumatic.  Eyes: Conjunctivae and EOM are normal.  Cardiovascular: Regular rhythm, normal heart sounds and intact distal pulses.  Tachycardia present. Exam reveals no gallop and no friction rub.  No murmur heard. Pulmonary/Chest: Effort normal and breath sounds normal. No respiratory distress. She has no wheezes. She has no rales.  Abdominal: Soft. Bowel sounds are normal. She exhibits no distension. There is no abdominal tenderness. There is no rebound.  Musculoskeletal:        General: Normal range of motion.     Comments: Tenderness to palpation of R flank, worse with deep inspiration  Neurological: She is alert and oriented to person, place, and time.  Skin: Skin is warm and dry. No rash noted. No erythema.    MRI Brain and orbits without contrast:  IMPRESSION: 1. Normal MRI of the brain and orbits. 2. 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 Otherwise normal intracranial MRV.  Assessment/Plan: [name] is a 24year old female with history of ESRD on PD and anemia of chronic disease presenting with bilateral vision loss, noted to have papilledema on ophthalmologic exam.   Bilateral vision loss:  Patient with blurry vision, decreased color perception, and floaters in bilateral vision fields presented from ophthalmologist office with papilledema. Concerns for meningitis vs pseudotumor cerebri; however LP wnl and opening pressure 19. MRI brain and orbits without any acute findings. Patient treated for hypertensive emergency with mild improvement  in symptoms this morning.  - Neurology on board, appreciate their recommendations  - Continuing BP control with hydralazine 25mg  bid and amlodipine 10mg  daily  - Labetalol 10mg  IV q2h prn for SBP >180 or DBP >100 - Dexamethasone for possible optic neuritis   ESRD on PD:  Secondary hyperparathyroidism: Anion gap metabolic acidosis: Hyperkalemia: Patient with hx of ESRD on PD after failed renal transplant. Has not been able to dialyze over past several nights due to her vision loss. Noted to have significant anion gap acidosis and  elevated BUN/Cr. Also noted to have hyperkalemia and significantly elevated phosphorous. Calcium wnl. Patient also noted to have nausea/vomiting which could be secondary to elevated BUN. Patient did not receive PD overnight due to unavailability of RN. - Nephrology consulted, appreciate their recommendations  - HD today  - Sodium bicarb 1300mg  bid - Calcitriol 1.84mcg MWF - Cinacalcet 30mg  daily - Tacrolimus 3mg  bid  Acute on chronic anemia: Thrombocytopenia:  Hb 6.5 this AM and Plt 102. No obvious signs of bleeding noted. Patient hemodynamically stable.  - Transfuse 1U pRBC with HD today - CBC daily  CAP: Patient was prescribed amoxicillin at urgent care for presumed sinusitis and has been taking it for 2-3 days. CXR with multifocal opacities concerning for CAP with leukocytosis in immunocompromised for which patient started on azithromycin and ceftriaxone. This morning, leukocytosis improved.  - Azithromycin 250mg  daily Day 2 - Ceftriaxone 1g Day 2 - CBC daily   FEN/GI:  Diet: Renal Fluids: None Electrolytes: Monitor and replete prn  VTE Prophylaxis: SCDs, Heparin during dialysis  Code status: FULL  Prior to Admission Living Arrangement: Home Anticipated Discharge Location: Home Barriers to Discharge: Continued medical management  [name], MD  Internal Medicine, PGY-1 Pager: [phone] 10/22/2019, 6:36 AM

## 2019-10-22 NOTE — Progress Notes (Signed)
NEUROLOGY PROGRESS NOTE   Subjective: Very tired and maybe a little better with her vision. Able to see her phone now  Exam: Vitals:   10/22/19 0826 10/22/19 1016  BP: (!) 146/104 (!) 144/110  Pulse: (!) 101   Resp: (!) 24   Temp: 98.4 F (36.9 C)   SpO2: 94%     ROS General ROS: negative for - chills, fatigue, fever, night sweats, weight gain or weight loss Psychological ROS: negative for - behavioral disorder, hallucinations, memory difficulties, mood swings or suicidal ideation Ophthalmic ROS: positivefor - blurry vision,  loss of vision ENT ROS: negative for - epistaxis, nasal discharge, oral lesions, sore throat, tinnitus or vertigo Allergy and Immunology ROS: negative for - hives or itchy/watery eyes Hematological and Lymphatic ROS: negative for - bleeding problems, bruising or swollen lymph nodes Endocrine ROS: negative for - galactorrhea, hair pattern changes, polydipsia/polyuria or temperature intolerance Respiratory ROS: negative for - cough, hemoptysis, shortness of breath or wheezing Cardiovascular ROS: negative for - chest pain, dyspnea on exertion, edema or irregular heartbeat Gastrointestinal ROS: negative for - abdominal pain, diarrhea, hematemesis, nausea/vomiting or stool incontinence Genito-Urinary ROS: negative for - dysuria, hematuria, incontinence or urinary frequency/urgency Musculoskeletal ROS: negative for - joint swelling or muscular weakness Neurological ROS: as noted in HPI Dermatological ROS: negative for rash and skin lesion changes        Physical Exam  Constitutional: Appears well-developed and well-nourished.  Psych: Affect appropriate to situation Eyes: No scleral injection HENT: No OP obstrucion Head: Normocephalic.  Cardiovascular: Normal rate and regular rhythm.  Respiratory: Effort normal, non-labored breathing GI: Soft.  No distension. There is no tenderness.  Skin: WDI   Neuro:  Mental Status: Alert, oriented, thought content  appropriate.  Speech fluent without evidence of aphasia.  Able to follow 3 step commands without difficulty. Cranial Nerves: II:  Visual fields grossly normal, 2/200 right ey and no vision left eye with snellen card III,IV, VI: ptosis not present, extra-ocular motions intact bilaterally pupils equal, round, reactive to light and accommodation V,VII: smile symmetric, facial light touch sensation normal bilaterally VIII: hearing normal bilaterally XII: midline tongue extension Motor: Right : Upper extremity   5/5    Left:     Upper extremity   5/5  Lower extremity   5/5     Lower extremity   5/5 Tone and bulk:normal tone throughout; no atrophy noted     Medications:  Scheduled: . acetaminophen  650 mg Oral Q6H WA   Or  . acetaminophen  650 mg Rectal Q6H WA  . amLODipine  10 mg Oral Daily  . azithromycin  250 mg Oral Daily  . [START ON 10/23/2019] calcitRIOL  0.5 mcg Oral Once per day on Mon Wed Fri  . Chlorhexidine Gluconate Cloth  6 each Topical Q0600  . cinacalcet  30 mg Oral Q breakfast  . darbepoetin (ARANESP) injection - DIALYSIS  200 mcg Intravenous Q Tue-HD  . famotidine  20 mg Oral QHS  . gentamicin cream  1 application Topical Daily  . hydrALAZINE  25 mg Oral BID  . multivitamin  1 tablet Oral QHS  . mycophenolate  1,000 mg Oral BID  . ramelteon  8 mg Oral QHS  . sodium bicarbonate  1,300 mg Oral BID  . tacrolimus  3 mg Oral BID   Continuous: . sodium chloride    . sodium chloride    . cefTRIAXone (ROCEPHIN)  IV    . dialysis solution 1.5% low-MG/low-CA Stopped (10/22/19 0546)  .  dialysis solution 2.5% low-MG/low-CA Stopped (10/22/19 0546)  . ferric gluconate (FERRLECIT/NULECIT) IV      Pertinent Labs/Diagnostics: Results for KOLLEEN, DURRETTE (MRN [id number]) as of 10/22/2019 10:23   Ref. Range 10/21/2019 15:02  Appearance, CSF Latest Ref Range: CLEAR  CLEAR  Glucose, CSF Latest Ref Range: 40 - 70 mg/dL 50  RBC Count, CSF Latest Ref Range: 0 /cu mm 0  WBC,  CSF Latest Ref Range: 0 - 5 /cu mm 1  Other Cells, CSF Unknown TOO FEW TO COUNT, SMEAR AVAILABLE FOR REVIEW  Color, CSF Latest Ref Range: COLORLESS  COLORLESS  Supernatant Unknown NOT INDICATED  Total  Protein, CSF Latest Ref Range: 15 - 45 mg/dL 30  Tube # Unknown 3   CSF:   AFB pending, AFB with smear pending,   Cryptococcal negative, culture negative,   CT Head Wo Contrast Result Date: 10/21/2019 . IMPRESSION: No CT evidence of acute intracranial abnormality. Consider brain MRI for further evaluation. Diffusely increased density of the intracranial venous vasculature, suggestive of hemoconcentration/dehydration. Electronically Signed   By: Kellie Simmering DO   On: 10/21/2019 14:58    MR Venogram Head Result Date: 10/22/2019  IMPRESSION: 1. Normal MRI of the brain and orbits. 2. 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 Otherwise normal intracranial MRV. Electronically Signed   By: Ulyses Jarred M.D.   On: 10/22/2019 00:43    MR ORBITS WO CONTRAST Result Date: 10/22/2019 . IMPRESSION: 1. Normal MRI of the brain and orbits. 2. 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 Otherwise normal intracranial MRV. Electronically Signed   By: Ulyses Jarred M.D.   On: 10/22/2019 00:43   Etta Quill PA-C [facility] [phone]  I have seen the patient reviewed the above note.  Impression: 24 YO with bilateral papilledema and rapidly progressive vision loss. CSF opening pressure within normal limits and Cells/protien are normal which as noted prior would argue against meningitis. At this point believe her vision loss very well could be secondary to malignant HTN, however it be very difficult to rule out bilateral optic neuritis.  At this point, I would favor treating for both possible etiologies.   Recommendations: -Solu-Medrol daily dose of 1 g -serum Aquaporin 4 antibody(NMO), ana, serum ACE -continue aggressive hypertension  control    Roland Rack, MD [facility] [phone]  If 7pm- 7am, please page neurology on call as listed in [place].  10/22/2019, 10:20 AM

## 2019-10-22 NOTE — Progress Notes (Signed)
Spoke with charge nurse, Estill Bamberg, RN, concerning order for PD tonight. Pt still awaiting MRI. States she will make some calls concerning if this can be done tonight.   Update @ [id number] Spoke with Estill Bamberg, Therapist, sports, Camera operator. Pt may not receive dialysis tonight due to no staff not able to initiate and oversee PD.   Update @ 2330 Pt to MRI in stable condition via bed with escort.   Update @ 10/22/2019 @ 0040 Pt returned from MRI in stable condition via bed.

## 2019-10-23 DIAGNOSIS — H469 Unspecified optic neuritis: Secondary | ICD-10-CM

## 2019-10-23 LAB — GLUCOSE, CAPILLARY
Glucose-Capillary: 298 mg/dL — ABNORMAL HIGH (ref 70–99)
Glucose-Capillary: 322 mg/dL — ABNORMAL HIGH (ref 70–99)

## 2019-10-23 LAB — CBC
HCT: 26.6 % — ABNORMAL LOW (ref 36.0–46.0)
Hemoglobin: 8.9 g/dL — ABNORMAL LOW (ref 12.0–15.0)
MCH: 31.1 pg (ref 26.0–34.0)
MCHC: 33.5 g/dL (ref 30.0–36.0)
MCV: 93 fL (ref 80.0–100.0)
Platelets: 109 10*3/uL — ABNORMAL LOW (ref 150–400)
RBC: 2.86 MIL/uL — ABNORMAL LOW (ref 3.87–5.11)
RDW: 15.9 % — ABNORMAL HIGH (ref 11.5–15.5)
WBC: 4.8 10*3/uL (ref 4.0–10.5)
nRBC: 0 % (ref 0.0–0.2)

## 2019-10-23 LAB — TYPE AND SCREEN
ABO/RH(D): AB POS
Antibody Screen: NEGATIVE
Unit division: 0
Unit division: 0

## 2019-10-23 LAB — RENAL FUNCTION PANEL
Albumin: 2.7 g/dL — ABNORMAL LOW (ref 3.5–5.0)
Anion gap: 22 — ABNORMAL HIGH (ref 5–15)
BUN: 73 mg/dL — ABNORMAL HIGH (ref 6–20)
CO2: 16 mmol/L — ABNORMAL LOW (ref 22–32)
Calcium: 7.8 mg/dL — ABNORMAL LOW (ref 8.9–10.3)
Chloride: 95 mmol/L — ABNORMAL LOW (ref 98–111)
Creatinine, Ser: 18.23 mg/dL — ABNORMAL HIGH (ref 0.44–1.00)
GFR calc Af Amer: 3 mL/min — ABNORMAL LOW (ref >60)
GFR calc non Af Amer: 2 mL/min — ABNORMAL LOW (ref >60)
Glucose, Bld: 254 mg/dL — ABNORMAL HIGH (ref 70–99)
Phosphorus: 11.1 mg/dL — ABNORMAL HIGH (ref 2.5–4.6)
Potassium: 5.2 mmol/L — ABNORMAL HIGH (ref 3.5–5.1)
Sodium: 133 mmol/L — ABNORMAL LOW (ref 135–145)

## 2019-10-23 LAB — BPAM RBC
Blood Product Expiration Date: 202103132359
Blood Product Expiration Date: 202103152359
ISSUE DATE / TIME: 202102160816
ISSUE DATE / TIME: 202102161428
Unit Type and Rh: 8400
Unit Type and Rh: 8400

## 2019-10-23 LAB — NEUROMYELITIS OPTICA AUTOAB, IGG: NMO-IgG: <1.5 U/mL (ref 0.0–3.0)

## 2019-10-23 LAB — HEMOGLOBIN A1C
Hgb A1c MFr Bld: 4.4 % — ABNORMAL LOW (ref 4.8–5.6)
Mean Plasma Glucose: 79.58 mg/dL

## 2019-10-23 MED ORDER — INSULIN ASPART 100 UNIT/ML ~~LOC~~ SOLN
0.0000 [IU] | Freq: Three times a day (TID) | SUBCUTANEOUS | Status: DC
  Administered 2019-10-24 (×2): 1 [IU] via SUBCUTANEOUS
  Administered 2019-10-25: 3 [IU] via SUBCUTANEOUS
  Administered 2019-10-25: 1 [IU] via SUBCUTANEOUS
  Administered 2019-10-25 – 2019-10-26 (×2): 2 [IU] via SUBCUTANEOUS

## 2019-10-23 MED ORDER — HEPARIN SODIUM (PORCINE) 1000 UNIT/ML IJ SOLN
INTRAMUSCULAR | Status: AC
  Administered 2019-10-23: 3300 [IU] via INTRAVENOUS_CENTRAL
  Filled 2019-10-23: qty 4

## 2019-10-23 MED ORDER — MYCOPHENOLATE MOFETIL 250 MG PO CAPS
500.0000 mg | ORAL_CAPSULE | Freq: Two times a day (BID) | ORAL | Status: DC
  Administered 2019-10-23 – 2019-10-26 (×6): 500 mg via ORAL
  Filled 2019-10-23 (×7): qty 2

## 2019-10-23 MED ORDER — DIPHENHYDRAMINE HCL 25 MG PO CAPS
25.0000 mg | ORAL_CAPSULE | Freq: Once | ORAL | Status: AC
  Administered 2019-10-23: 25 mg via ORAL

## 2019-10-23 MED ORDER — DIPHENHYDRAMINE HCL 25 MG PO CAPS
25.0000 mg | ORAL_CAPSULE | Freq: Once | ORAL | Status: AC
  Administered 2019-10-23: 25 mg via ORAL
  Filled 2019-10-23: qty 1

## 2019-10-23 MED ORDER — HYDRALAZINE HCL 50 MG PO TABS
50.0000 mg | ORAL_TABLET | Freq: Three times a day (TID) | ORAL | Status: DC
  Administered 2019-10-23 – 2019-10-24 (×3): 50 mg via ORAL
  Filled 2019-10-23 (×3): qty 1

## 2019-10-23 MED ORDER — DIPHENHYDRAMINE HCL 25 MG PO CAPS
ORAL_CAPSULE | ORAL | Status: AC
  Filled 2019-10-23: qty 1

## 2019-10-23 MED ORDER — ACETAMINOPHEN 325 MG PO TABS
ORAL_TABLET | ORAL | Status: AC
  Filled 2019-10-23: qty 2

## 2019-10-23 MED ORDER — BUTALBITAL-APAP-CAFFEINE 50-325-40 MG PO TABS
1.0000 | ORAL_TABLET | Freq: Once | ORAL | Status: AC
  Administered 2019-10-23: 1 via ORAL
  Filled 2019-10-23: qty 1

## 2019-10-23 MED ORDER — CALCIUM CARBONATE ANTACID 500 MG PO CHEW
1.0000 | CHEWABLE_TABLET | Freq: Once | ORAL | Status: AC
  Administered 2019-10-23: 200 mg via ORAL
  Filled 2019-10-23: qty 1

## 2019-10-23 NOTE — Progress Notes (Signed)
Renal Navigator spoke with Dr. Upton/Nephrologist regarding plan for patient transitioning from PD to HD. Navigator spoke with Museum/gallery conservator at [facility] who states patient has been given TTS first shift seat and that they will confirm exact time (around 7:00am) with patient at a later date. Clinic is already aware of plan to transition back to HD.  Alphonzo Cruise, [place] Renal Navigator [phone]

## 2019-10-23 NOTE — Progress Notes (Signed)
Subjective: [name] had first session of HD yesterday and tolerated well. Overnight, patient with headache and blurry vision. She was given 3 doses of IV labetalol for persistent hypertension.   This morning, patient evaluated at bedside. She notes her vision initially improved yesterday but became blurry again overnight. She notes that it is about the same as when she came in. She notes that she has had hypertension chronically and was told it was because of her hyperparathyroidism. She was supposed to get surgery but has not been able to schedule this.   Objective:  Vital signs in last 24 hours: Vitals:   10/23/19 1300 10/23/19 1330 10/23/19 1400 10/23/19 1523  BP: (!) 157/84 133/74 (!) 148/77 (!) 155/98  Pulse: 85 92 93 92  Resp:    20  Temp:    98.1 F (36.7 C)  TempSrc:    Oral  SpO2:    100%  Weight:      Height:       CBC Latest Ref Rng & Units 10/23/2019 10/22/2019 10/21/2019  WBC 4.0 - 10.5 K/uL 4.8 10.5 11.3(H)  Hemoglobin 12.0 - 15.0 g/dL 8.9(L) 6.5(LL) 8.3(L)  Hematocrit 36.0 - 46.0 % 26.6(L) 21.2(L) 27.7(L)  Platelets 150 - 400 K/uL 109(L) 102(L) 117(L)   CMP Latest Ref Rng & Units 10/23/2019 10/22/2019 10/21/2019  Glucose 70 - 99 mg/dL 254(H) 107(H) 86  BUN 6 - 20 mg/dL 73(H) 127(H) 119(H)  Creatinine 0.44 - 1.00 mg/dL 18.23(H) 39.17(H) 28.74(H)  Sodium 135 - 145 mmol/L 133(L) 135 135  Potassium 3.5 - 5.1 mmol/L 5.2(H) 5.5(H) 5.9(H)  Chloride 98 - 111 mmol/L 95(L) 101 99  CO2 22 - 32 mmol/L 16(L) 10(L) 10(L)  Calcium 8.9 - 10.3 mg/dL 7.8(L) 7.7(L) 8.3(L)  Total Protein 6.5 - 8.1 g/dL - 6.0(L) 6.3(L)  Total Bilirubin 0.3 - 1.2 mg/dL - 1.0 1.0  Alkaline Phos 38 - 126 U/L - 106 119  AST 15 - 41 U/L - 12(L) 12(L)  ALT 0 - 44 U/L - 13 13   Physical Exam  Constitutional: She is oriented to person, place, and time and well-developed, well-nourished, and in no distress.  HENT:  Head: Normocephalic and atraumatic.  Eyes: Conjunctivae and EOM are normal.    Cardiovascular: Regular rhythm, normal heart sounds and intact distal pulses. Tachycardia present. Exam reveals no gallop and no friction rub.  No murmur heard. Pulmonary/Chest: Effort normal and breath sounds normal. No respiratory distress. She has no wheezes. She has no rales.  Abdominal: Soft. Bowel sounds are normal. She exhibits no distension. There is no abdominal tenderness. There is no rebound.  Musculoskeletal:        General: Normal range of motion.     Comments: Tenderness to palpation of R flank, worse with deep inspiration  Neurological: She is alert and oriented to person, place, and time.  Skin: Skin is warm and dry. No rash noted. No erythema.    Assessment/Plan: [name] is a 24year old female with history of ESRD on PD and anemia of chronic disease presenting with bilateral vision loss, noted to have papilledema on ophthalmologic exam.   Bilateral vision loss:  Hypertensive emergency: Optic neuritis: Patient with blurry vision, decreased color perception, and floaters in bilateral vision fields presented from ophthalmologist office with papilledema. Concerns for meningitis vs pseudotumor cerebri; however LP wnl and opening pressure 19. MRI brain and orbits without any acute findings. Patient with persistent visual deficits. SBP [id number] and diastolic 123456 overnight, requiring IV labetalol.  Suspect patient's volume status also contributing to her hypertension as she has not had any dialysis in several days. Patient for HD session today and will adjust anti-hypertensive medications for better BP control.  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 Has not been able to dialyze deveral nights due to her vision loss. Noted to have significant anion gap acidosis and elevated BUN/Cr. Patient had re-initiation of HD yesterday and tolerated it well. She went for HD today with ~1L removed.  - Nephrology consulted, appreciate their recommendations  - Serial HD's - Sodium bicarb 1300mg  bid - Calcitriol 1.20mcg MWF - Cinacalcet 30mg  daily - Tacrolimus 3mg  bid  Acute on chronic anemia: Thrombocytopenia:  Hb 8.9 this morning following 1U pRBC transfusion yesterday. Patient hemodynamically stable.  - CBC daily  CAP: Patient was prescribed amoxicillin at urgent care for presumed sinusitis and has been taking it for 2-3 days. CXR with multifocal opacities concerning for CAP with leukocytosis in immunocompromised for which patient started on azithromycin and ceftriaxone. This morning, leukocytosis resolved and patient asymptomatic. Can likely discontinue tomorrow if patient remains stable.  - Azithromycin 250mg  daily Day 2 - Ceftriaxone 1g Day 2 - CBC daily   FEN/GI:  Diet: Renal Fluids: None Electrolytes: Monitor and replete prn  VTE Prophylaxis: SCDs, Heparin during dialysis  Code status: FULL  Prior to Admission Living Arrangement: Home Anticipated Discharge Location: Home Barriers to Discharge: Continued medical management  [name], MD  Internal Medicine, PGY-1 Pager: [phone] 10/23/2019, 4:25 PM

## 2019-10-23 NOTE — Progress Notes (Signed)
Patient states her vision is "getting worse."  BP was elevated.  PRN hydralazine IVP administered.  Patient continued to complained about vision.  Patient states she is unable to see this Probation officer and Lennie Odor, RN that is also present in the room.  She said we were only "blurbs."  Patient states she can not see anything and is "scared to death."  During this time, patient is able to text on her phone and see her pills.  She said that she can not see close up nor far away.  Patient ended up refusing her sleeping pill, Tylenol, and another med.  MD notified and updated on patient's symptoms and status.

## 2019-10-23 NOTE — Progress Notes (Signed)
NEUROLOGY PROGRESS NOTE   Subjective: Patient states she still has headache.  States yesterday she started to feel vision slightly better.  Last night woke up and felt her vision was slightly worse.  She states that she is having episodes of tunnel vision, still seeing black spots.  Exam: Vitals:   10/23/19 0331 10/23/19 0726  BP: (!) 149/105 (!) 168/112  Pulse: 91 94  Resp: 18 17  Temp: 98.2 F (36.8 C) 98.5 F (36.9 C)  SpO2: 92% 99%    ROS General ROS: negative for - chills, fatigue, fever, night sweats, weight gain or weight loss Psychological ROS: negative for - behavioral disorder, hallucinations, memory difficulties, mood swings or suicidal ideation Ophthalmic ROS: Positive for - blurry vision,  loss of vision ENT ROS: negative for - epistaxis, nasal discharge, oral lesions, sore throat, tinnitus or vertigo Respiratory ROS: negative for - cough, hemoptysis, shortness of breath or wheezing Cardiovascular ROS: negative for - chest pain, dyspnea on exertion, edema or irregular heartbeat Gastrointestinal ROS: negative for - abdominal pain, diarrhea, hematemesis, nausea/vomiting or stool incontinence Genito-Urinary ROS: negative for - dysuria, hematuria, incontinence or urinary frequency/urgency Musculoskeletal ROS: negative for - joint swelling or muscular weakness Neurological ROS: as noted in HPI Dermatological ROS: negative for rash and skin lesion changes        Physical Exam  Constitutional: Appears well-developed and well-nourished.  Psych: Affect appropriate to situation Eyes: No scleral injection HENT: No OP obstrucion Head: Normocephalic.  Cardiovascular: Normal rate and regular rhythm.  Respiratory: Effort normal, non-labored breathing GI: Soft.  No distension. There is no tenderness.  Skin: WDI   Neuro:  Mental Status: Alert, oriented, thought content appropriate.  Speech fluent without evidence of aphasia.  Able to follow 3 step commands without  difficulty. Cranial Nerves: II: OD equals 20/200.  OS equals no vision on Snellen card.  On Snellen card she is able to visualize red but states green is blue III,IV, VI: ptosis not present, extra-ocular motions intact bilaterally pupils equal, round, reactive to light and accommodation.  No APD V,VII: smile symmetric, facial light touch sensation normal bilaterally VIII: hearing normal bilaterally IX,X: Palate rises midline XI: bilateral shoulder shrug XII: midline tongue extension Motor: Right : Upper extremity   5/5    Left:     Upper extremity   5/5  Lower extremity   5/5     Lower extremity   5/5 Tone and bulk:normal tone throughout; no atrophy noted Sensory: Pinprick and light touch intact throughout, bilaterally   Medications:  Scheduled: . acetaminophen  650 mg Oral Q6H WA   Or  . acetaminophen  650 mg Rectal Q6H WA  . amLODipine  10 mg Oral Daily  . azithromycin  250 mg Oral Daily  . calcitRIOL  0.5 mcg Oral Once per day on Mon Wed Fri  . Chlorhexidine Gluconate Cloth  6 each Topical Q0600  . cinacalcet  30 mg Oral Q breakfast  . darbepoetin (ARANESP) injection - DIALYSIS  200 mcg Intravenous Q Tue-HD  . diclofenac Sodium  2 g Topical QID  . famotidine  20 mg Oral QHS  . gentamicin cream  1 application Topical Daily  . hydrALAZINE  25 mg Oral BID  . insulin aspart  0-6 Units Subcutaneous TID WC  . multivitamin  1 tablet Oral QHS  . mycophenolate  1,000 mg Oral BID  . ramelteon  8 mg Oral QHS  . sodium bicarbonate  1,300 mg Oral BID  . tacrolimus  3  mg Oral BID   Continuous: . cefTRIAXone (ROCEPHIN)  IV 1 g (10/22/19 1700)  . dialysis solution 1.5% low-MG/low-CA Stopped (10/22/19 0546)  . dialysis solution 2.5% low-MG/low-CA Stopped (10/22/19 0546)  . ferric gluconate (FERRLECIT/NULECIT) IV Stopped (10/22/19 1500)  . methylPREDNISolone (SOLU-MEDROL) injection 250 mg (10/23/19 0516)    Pertinent Labs/Diagnostics: Pending labs-ACE serum, ANA with reflex, and  neuromyelitis optica auto antibodies CSF aAFB smear-negative      Impression: 24 year old with bilateral papilledema and rapid progression visual loss.  Patient received Solu-Medrol yesterday with some improvement.  However states as above, today slight improvement but no significant improvement.  At this point still considering loss of vision secondary to malignant hypertension, however it is difficult to rule out bilateral optic neuritis.  Still favoring treating both possible etiologies   Recommendations: -Solu-Medrol daily dose of 1 g -Still awaiting Aquaporin 4 antibody(NMO), ana, serum ACE -Blood pressure still not under control.  Last blood pressure 168/112. -May consider cardiology consult for blood pressure/may need to be transferred for IV Cardene to get blood pressure under control in the interim   Etta Quill PA-C [facility] [phone] 10/23/2019, 8:41 AM

## 2019-10-23 NOTE — Plan of Care (Signed)
Pt complained of headache and recurrence of B blurring of vision which slightly got better yesterday afternoon. Also remained hypertensive overnight. Total of 3 prn Labetalol IV given for DBP>100. On call Md informed, advised to monitor and continue IV solumedrol and bp control for now. Pt reassured, prn benadryl given which helped her get some sleep eventually.  Monitored accordingly.   Problem: Education: Goal: Knowledge of General Education information will improve Description: Including pain rating scale, medication(s)/side effects and non-pharmacologic comfort measures Outcome: Progressing   Problem: Coping: Goal: Level of anxiety will decrease Outcome: Progressing   Problem: Pain Managment: Goal: General experience of comfort will improve Outcome: Progressing   Problem: Safety: Goal: Ability to remain free from injury will improve Outcome: Progressing

## 2019-10-23 NOTE — Progress Notes (Addendum)
[facility] Progress Note   Assessment/ Plan:   Dialyzes at [facility],  [facility], 4 exchanges, fill 2000 cc, dwell time 1.5 hour, no last fill, EDW 59 kg. Aranesp 200 mcg on 2/2, venofer 100 mg post HD.  Calcitriol 0.5 mcg 3x/week sensipar 30 mg daily auryxia 2 tabs TID   1. Bilateral vision loss: Imaging studies with no acute finding.  Neurology is following.  MRI negative.  LP negative, opening pressure WNL, neuro following and is suspecting optic neuritis and malignant HTN.  Getting IV solumedrol.    2. ESRD: Didn't get PD last night. HD #1 2/16, HD #2 2/17. Transitioning back to HD.     3 Hypertension: BP elevated, I am hopeful for better BP control when we get volume off.  D/w primary team- will attempt aggressive management with PO meds and UF.  Will reassess after HD today for BP med titration.     4. Anemia of ESRD: Hemoglobin stable, below goal.  Order iron and Aranesp per tomorrow.  5. Metabolic Bone Disease: Resume Sensipar and Auryxia.  6.  Hyperkalemia: resolved s/p HD  7.  Metabolic acidosis: stop bicarb.   8.  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  For HD #2 today.  BP still high, required prns, diastolic especially.    Objective:   BP (!) 146/101 (BP Location: Left Arm)   Pulse (!) 101   Temp 98.2 F (36.8 C) (Oral)   Resp 18   Ht 5' (1.524 m)   Wt 61.6 kg   LMP 10/06/2019   SpO2 99%   BMI 26.52 kg/m   Physical Exam: Gen: young woman, sitting in bed, NAD CVS: RRR no rub Resp: normal WOB clear R lung, L lung with some rhonchi Abd: soft, nontender, NABS Ext: no LE edema ACCESS: PD cath in place, R IJ TDC in place- both dressings are soiled  Labs: BMET Recent Labs  Lab 10/18/19 1202 10/21/19 1241 10/22/19 0250 10/23/19 0233  NA 139 135 135 133*  K 4.6 5.9* 5.5* 5.2*  CL 101 99 101 95*  CO2 14* 10* 10* 16*  GLUCOSE  99 86 107* 254*  BUN 97* 119* 127* 73*  CREATININE 23.98* 28.74* 39.17* 18.23*  CALCIUM 8.0* 8.3* 7.7* 7.8*  PHOS  --   --  >30.0* 11.1*   CBC Recent Labs  Lab 10/18/19 1202 10/21/19 1241 10/22/19 0250 10/23/19 0341  WBC 6.4 11.3* 10.5 4.8  NEUTROABS 5.0  --   --   --   HGB 8.2* 8.3* 6.5* 8.9*  HCT 26.1* 27.7* 21.2* 26.6*  MCV 102.0* 105.3* 102.4* 93.0  PLT 163 117* 102* 109*      Medications:    . acetaminophen  650 mg Oral Q6H WA   Or  . acetaminophen  650 mg Rectal Q6H WA  . amLODipine  10 mg Oral Daily  . azithromycin  250 mg Oral Daily  . calcitRIOL  0.5 mcg Oral Once per day on Mon Wed Fri  . Chlorhexidine Gluconate Cloth  6 each Topical Q0600  . cinacalcet  30 mg Oral Q breakfast  . darbepoetin (ARANESP) injection - DIALYSIS  200 mcg Intravenous Q Tue-HD  . diclofenac Sodium  2 g Topical QID  . famotidine  20 mg Oral QHS  . gentamicin cream  1 application Topical Daily  . hydrALAZINE  25 mg Oral  BID  . insulin aspart  0-6 Units Subcutaneous TID WC  . multivitamin  1 tablet Oral QHS  . mycophenolate  1,000 mg Oral BID  . ramelteon  8 mg Oral QHS  . sodium bicarbonate  1,300 mg Oral BID  . tacrolimus  3 mg Oral BID     Madelon Lips, MD 10/23/2019, 11:41 AM

## 2019-10-24 LAB — RENAL FUNCTION PANEL
Albumin: 3 g/dL — ABNORMAL LOW (ref 3.5–5.0)
Anion gap: 19 — ABNORMAL HIGH (ref 5–15)
BUN: 42 mg/dL — ABNORMAL HIGH (ref 6–20)
CO2: 21 mmol/L — ABNORMAL LOW (ref 22–32)
Calcium: 8.7 mg/dL — ABNORMAL LOW (ref 8.9–10.3)
Chloride: 93 mmol/L — ABNORMAL LOW (ref 98–111)
Creatinine, Ser: 10.3 mg/dL — ABNORMAL HIGH (ref 0.44–1.00)
GFR calc Af Amer: 6 mL/min — ABNORMAL LOW (ref >60)
GFR calc non Af Amer: 5 mL/min — ABNORMAL LOW (ref >60)
Glucose, Bld: 201 mg/dL — ABNORMAL HIGH (ref 70–99)
Phosphorus: 8.8 mg/dL — ABNORMAL HIGH (ref 2.5–4.6)
Potassium: 4.1 mmol/L (ref 3.5–5.1)
Sodium: 133 mmol/L — ABNORMAL LOW (ref 135–145)

## 2019-10-24 LAB — CBC
HCT: 26.6 % — ABNORMAL LOW (ref 36.0–46.0)
Hemoglobin: 8.8 g/dL — ABNORMAL LOW (ref 12.0–15.0)
MCH: 31.4 pg (ref 26.0–34.0)
MCHC: 33.1 g/dL (ref 30.0–36.0)
MCV: 95 fL (ref 80.0–100.0)
Platelets: 155 10*3/uL (ref 150–400)
RBC: 2.8 MIL/uL — ABNORMAL LOW (ref 3.87–5.11)
RDW: 15.7 % — ABNORMAL HIGH (ref 11.5–15.5)
WBC: 7.7 10*3/uL (ref 4.0–10.5)
nRBC: 0.4 % — ABNORMAL HIGH (ref 0.0–0.2)

## 2019-10-24 LAB — GLUCOSE, CAPILLARY
Glucose-Capillary: 262 mg/dL — ABNORMAL HIGH (ref 70–99)
Glucose-Capillary: 162 mg/dL — ABNORMAL HIGH (ref 70–99)
Glucose-Capillary: 179 mg/dL — ABNORMAL HIGH (ref 70–99)
Glucose-Capillary: 234 mg/dL — ABNORMAL HIGH (ref 70–99)

## 2019-10-24 LAB — ANGIOTENSIN CONVERTING ENZYME: Angiotensin-Converting Enzyme: 49 U/L (ref 14–82)

## 2019-10-24 LAB — ANA W/REFLEX IF POSITIVE: Anti Nuclear Antibody (ANA): NEGATIVE

## 2019-10-24 LAB — MAGNESIUM: Magnesium: 2.1 mg/dL (ref 1.7–2.4)

## 2019-10-24 MED ORDER — HEPARIN SODIUM (PORCINE) 1000 UNIT/ML IJ SOLN
INTRAMUSCULAR | Status: AC
  Administered 2019-10-24: 3300 [IU]
  Filled 2019-10-24: qty 4

## 2019-10-24 MED ORDER — LOSARTAN POTASSIUM 50 MG PO TABS
50.0000 mg | ORAL_TABLET | Freq: Every day | ORAL | Status: DC
  Administered 2019-10-24 – 2019-10-26 (×3): 50 mg via ORAL
  Filled 2019-10-24 (×4): qty 1

## 2019-10-24 MED ORDER — ACETAMINOPHEN 325 MG PO TABS
ORAL_TABLET | ORAL | Status: AC
  Administered 2019-10-24: 650 mg via ORAL
  Filled 2019-10-24: qty 2

## 2019-10-24 MED ORDER — LABETALOL HCL 200 MG PO TABS
200.0000 mg | ORAL_TABLET | Freq: Three times a day (TID) | ORAL | Status: DC
  Administered 2019-10-24 – 2019-10-26 (×6): 200 mg via ORAL
  Filled 2019-10-24 (×6): qty 1

## 2019-10-24 MED ORDER — LIDOCAINE 5 % EX PTCH
1.0000 | MEDICATED_PATCH | CUTANEOUS | Status: DC
  Administered 2019-10-24 – 2019-10-25 (×2): 1 via TRANSDERMAL
  Filled 2019-10-24 (×2): qty 1

## 2019-10-24 MED ORDER — CALCITRIOL 0.5 MCG PO CAPS
ORAL_CAPSULE | ORAL | Status: AC
  Filled 2019-10-24: qty 1

## 2019-10-24 MED ORDER — ISOSORB DINITRATE-HYDRALAZINE 20-37.5 MG PO TABS
1.0000 | ORAL_TABLET | Freq: Three times a day (TID) | ORAL | Status: DC
  Administered 2019-10-24 – 2019-10-25 (×3): 1 via ORAL
  Filled 2019-10-24 (×3): qty 1

## 2019-10-24 NOTE — Progress Notes (Addendum)
Subjective: HD#3 Patient had serial HD yesterday with 1L removed. Overnight, on call team paged regarding patient requesting blood transfusion for her blurred vision. No changes noted on physical examination and repeat CBC with stable Hb.    This morning, patient evaluated at bedside during HD. Patient notes that she is doing alright overall today. She notes that vision is stable from yesterday although this morning, she woke up and felt like she couldn't see but upon waking up a little more, she was able to note that vision is okay. She notes that she wears glasses at baseline and vision in left eye is "bad" at baseline but the only acute change is inability to differentiate blue-green colors.  Objective:  Vital signs in last 24 hours: Vitals:   10/23/19 1924 10/23/19 1938 10/23/19 2313 10/24/19 0407  BP: (!) 148/99 (!) 147/101 (!) 155/108 (!) 159/110  Pulse: 98 94 93 91  Resp: 18 18 18 16   Temp:  98.6 F (37 C) 98.6 F (37 C) 98.1 F (36.7 C)  TempSrc:  Oral Oral Oral  SpO2:  99% 95% 100%  Weight:      Height:       CBC Latest Ref Rng & Units 10/24/2019 10/23/2019 10/22/2019  WBC 4.0 - 10.5 K/uL 7.7 4.8 10.5  Hemoglobin 12.0 - 15.0 g/dL 8.8(L) 8.9(L) 6.5(LL)  Hematocrit 36.0 - 46.0 % 26.6(L) 26.6(L) 21.2(L)  Platelets 150 - 400 K/uL 155 109(L) 102(L)   CMP Latest Ref Rng & Units 10/24/2019 10/23/2019 10/22/2019  Glucose 70 - 99 mg/dL 201(H) 254(H) 107(H)  BUN 6 - 20 mg/dL 42(H) 73(H) 127(H)  Creatinine 0.44 - 1.00 mg/dL 10.30(H) 18.23(H) 39.17(H)  Sodium 135 - 145 mmol/L 133(L) 133(L) 135  Potassium 3.5 - 5.1 mmol/L 4.1 5.2(H) 5.5(H)  Chloride 98 - 111 mmol/L 93(L) 95(L) 101  CO2 22 - 32 mmol/L 21(L) 16(L) 10(L)  Calcium 8.9 - 10.3 mg/dL 8.7(L) 7.8(L) 7.7(L)  Total Protein 6.5 - 8.1 g/dL - - 6.0(L)  Total Bilirubin 0.3 - 1.2 mg/dL - - 1.0  Alkaline Phos 38 - 126 U/L - - 106  AST 15 - 41 U/L - - 12(L)  ALT 0 - 44 U/L - - 13   Physical Exam  Constitutional: She is oriented  to person, place, and time and well-developed, well-nourished, and in no distress.  HENT:  Head: Normocephalic and atraumatic.  Eyes: Conjunctivae and EOM are normal.  Cardiovascular: Regular rhythm, normal heart sounds and intact distal pulses. Tachycardia present. Exam reveals no gallop and no friction rub.  No murmur heard. Pulmonary/Chest: Effort normal and breath sounds normal. No respiratory distress. She has no wheezes. She has no rales.  Abdominal: Soft. Bowel sounds are normal. She exhibits no distension. There is no abdominal tenderness. There is no rebound.  Musculoskeletal:        General: Normal range of motion.     Comments: Tenderness to palpation of R flank, worse with deep inspiration  Neurological: She is alert and oriented to person, place, and time.  Skin: Skin is warm and dry. No rash noted. No erythema.    Assessment/Plan: [name] is a 24year old female with history of ESRD on PD and anemia of chronic disease presenting with bilateral vision loss, noted to have papilledema on ophthalmologic exam.   Bilateral vision loss:  Hypertensive emergency: Optic neuritis: Patient with blurry vision, decreased color perception, and floaters in bilateral vision fields presented from ophthalmologist office with papilledema.LP wnl and opening pressure  19. MRI brain and orbits without any acute findings. Patient requiring IV labetalol overnight for DBP>100. Suspect her volume status contributing to her hypertension and will benefit from serial HD's. Discussed with cardiology regarding IV antihypertensive gtt. At this time, recommended for continuing oral regimen. Discussed concerns for ongoing papilledema with ophthalmologist. At this time, recommended to continue medical management to optimize blood pressure and follow up in clinic as outpatient.  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 Has not been able to dialyze deveral nights due to her vision loss. Noted to have significant anion gap acidosis and elevated BUN/Cr. Patient has been receiving serial HD's during admission and tolerating well with total ~2L removed.  - Nephrology consulted, appreciate their recommendations  - Continue serial HD's - Sodium bicarb 1300mg  bid - Calcitriol 1.36mcg MWF - Cinacalcet 30mg  daily - Tacrolimus 3mg  bid  Acute on chronic anemia: Thrombocytopenia:  Hb stable this morning at 8.8. Patient hemodynamically stable. - Iron and aranesp per nephrology tomorrow  - CBC daily  CAP: Patient was prescribed amoxicillin at urgent care for presumed sinusitis and has been taking it for 2-3 days. CXR with multifocal opacities concerning for CAP with leukocytosis in immunocompromised for which patient started on azithromycin and ceftriaxone.She has received three days of antibiotics and at this point, leukocytosis is resolved and patient is asymptomatic. Will discontinue antibiotics.  - CBC daily   FEN/GI:  Diet: Renal Fluids: None Electrolytes: Monitor and replete prn  VTE Prophylaxis: SCDs, Heparin during dialysis  Code status: FULL  Prior to Admission Living Arrangement: Home Anticipated Discharge Location: Home Barriers to Discharge: Continued medical management  [name], MD  Internal Medicine, PGY-1 Pager: [phone] 10/24/2019, 6:41 AM

## 2019-10-24 NOTE — Consult Note (Signed)
CARDIOLOGY CONSULT NOTE  Patient ID: Tara Mills MRN: [id number] DOB/AGE: March 23, 1996 24 y.o.  Admit date: 10/21/2019 Referring Physician  [name], MD Primary Physician:  Patient, No Pcp Per Reason for Consultation  Hypertensive emergency  Patient ID: Tara Mills, female    DOB: [birth year], 24 y.o.   MRN: [id number]  Chief Complaint  Patient presents with  . Loss of Vision   HPI:    Tara Mills  is a 24 y.o. AAF Caucasian female patien with t with History of CKD and is being evaluated for repeat Transplant, follows [facility] team. Currently on peritoneal Dialysis. Was diagnosed with one kidney at age 5. At 24 years of age had transplant in [place] but has had graft failure. Moved to [facility] in May 2020. Started peritoneal dialysis in Dec 2020 in [place]. Was having HD prior to this.  Told to have severe secondary hyperparathyroidism.  No admitted for management of hypertension and papilledema that started about 10 days to 2 weeks ago.  Does  Not smoke or abuse alcohol and states she is careful with her diet.   About 2 weeks ago started having visual disturbances, also has had uncontrolled hypertension.  Was evaluated by ophthalmology and told to have papilledema and recommended hospital admission.  She has baseline visual disturbances and has to wear glasses but over the past 10 days vision especially left eye has gotten worse compared to the right eye.  No headache, nausea, vomiting.  Denies chest pain, palpitations, dizziness or syncope.  No other symptoms.  Past Medical History:  Diagnosis Date  . Anemia   . Renal disorder    Past Surgical History:  Procedure Laterality Date  . NEPHRECTOMY TRANSPLANTED ORGAN     2007   Social History   Socioeconomic History  . Marital status: Single    Spouse name: Not on file  . Number of children: Not on file  . Years of education: Not on file  . Highest education level: Not on file  Occupational History  . Not on  file  Tobacco Use  . Smoking status: Never Smoker  . Smokeless tobacco: Never Used  Substance and Sexual Activity  . Alcohol use: Not Currently  . Drug use: Never  . Sexual activity: Not on file  Other Topics Concern  . Not on file  Social History Narrative  . Not on file   Social Determinants of Health   Financial Resource Strain:   . Difficulty of Paying Living Expenses: Not on file  Food Insecurity:   . Worried About Charity fundraiser in the Last Year: Not on file  . Ran Out of Food in the Last Year: Not on file  Transportation Needs:   . Lack of Transportation (Medical): Not on file  . Lack of Transportation (Non-Medical): Not on file  Physical Activity:   . Days of Exercise per Week: Not on file  . Minutes of Exercise per Session: Not on file  Stress:   . Feeling of Stress : Not on file  Social Connections:   . Frequency of Communication with Friends and Family: Not on file  . Frequency of Social Gatherings with Friends and Family: Not on file  . Attends Religious Services: Not on file  . Active Member of Clubs or Organizations: Not on file  . Attends Archivist Meetings: Not on file  . Marital Status: Not on file  Intimate Partner Violence:   . Fear of Current or Ex-Partner: Not on file  .  Emotionally Abused: Not on file  . Physically Abused: Not on file  . Sexually Abused: Not on file   Family History  Problem Relation Age of Onset  . Hypertension Mother   . Hypertension Father     ROS  Review of Systems  Constitution: Positive for malaise/fatigue.  Eyes: Positive for visual disturbance and visual halos. Negative for discharge, double vision, photophobia and redness.  Cardiovascular: Negative for chest pain, dyspnea on exertion, leg swelling and palpitations.  Gastrointestinal: Negative for melena.   Objective   Vitals with BMI 10/24/2019 10/24/2019 10/24/2019  Height - - -  Weight - - -  BMI - - -  Systolic [id number]  Diastolic 84 80 97   Pulse 79 86 96    Blood pressure 130/84, pulse 79, temperature 98 F (36.7 C), temperature source Oral, resp. rate 18, height 5' (1.524 m), weight 58.2 kg, last menstrual period 10/06/2019, SpO2 100 %. Body mass index is 25.06 kg/m.    Physical Exam  Cardiovascular: Normal rate, regular rhythm and intact distal pulses. Exam reveals gallop and S4.  No murmur heard. No leg edema, no JVD.  Pulmonary/Chest: Effort normal and breath sounds normal.  Abdominal: Soft. Bowel sounds are normal.  Skin: Skin is warm and dry.   Laboratory examination:   Recent Labs    10/22/19 0250 10/23/19 0233 10/24/19 0051  NA 135 133* 133*  K 5.5* 5.2* 4.1  CL 101 95* 93*  CO2 10* 16* 21*  GLUCOSE 107* 254* 201*  BUN 127* 73* 42*  CREATININE 39.17* 18.23* 10.30*  CALCIUM 7.7* 7.8* 8.7*  GFRNONAA NOT CALCULATED 2* 5*  GFRAA NOT CALCULATED 3* 6*   estimated creatinine clearance is 6.8 mL/min (A) (by C-G formula based on SCr of 10.3 mg/dL (H)).  CMP Latest Ref Rng & Units 10/24/2019 10/23/2019 10/22/2019  Glucose 70 - 99 mg/dL 201(H) 254(H) 107(H)  BUN 6 - 20 mg/dL 42(H) 73(H) 127(H)  Creatinine 0.44 - 1.00 mg/dL 10.30(H) 18.23(H) 39.17(H)  Sodium 135 - 145 mmol/L 133(L) 133(L) 135  Potassium 3.5 - 5.1 mmol/L 4.1 5.2(H) 5.5(H)  Chloride 98 - 111 mmol/L 93(L) 95(L) 101  CO2 22 - 32 mmol/L 21(L) 16(L) 10(L)  Calcium 8.9 - 10.3 mg/dL 8.7(L) 7.8(L) 7.7(L)  Total Protein 6.5 - 8.1 g/dL - - 6.0(L)  Total Bilirubin 0.3 - 1.2 mg/dL - - 1.0  Alkaline Phos 38 - 126 U/L - - 106  AST 15 - 41 U/L - - 12(L)  ALT 0 - 44 U/L - - 13   CBC Latest Ref Rng & Units 10/24/2019 10/23/2019 10/22/2019  WBC 4.0 - 10.5 K/uL 7.7 4.8 10.5  Hemoglobin 12.0 - 15.0 g/dL 8.8(L) 8.9(L) 6.5(LL)  Hematocrit 36.0 - 46.0 % 26.6(L) 26.6(L) 21.2(L)  Platelets 150 - 400 K/uL 155 109(L) 102(L)   Lipid Panel  No results found for: CHOL, TRIG, HDL, CHOLHDL, VLDL, LDLCALC, LDLDIRECT HEMOGLOBIN A1C Lab Results  Component Value Date    HGBA1C 4.4 (L) 10/23/2019   MPG 79.58 10/23/2019   TSH No results for input(s): TSH in the last 8760 hours. BNP (last 3 results) No results for input(s): BNP in the last 8760 hours.  Medications and allergies   Allergies  Allergen Reactions  . Nsaids Anaphylaxis and Other (See Comments)    Due to ESRD   . Grapefruit Extract Other (See Comments)    Per pt: can't have because of medications     Current Outpatient Medications  Medication Instructions  .  amLODipine (NORVASC) 10 mg, Oral, Daily  . amoxicillin-clavulanate (AUGMENTIN) 875-125 MG tablet 1 tablet, Oral, Daily, Take for 10 days  . B Complex-C-Folic Acid (DIALYVITE Q000111Q) 0.8 MG TABS 1 tablet, Oral, Daily at bedtime  . calcitRIOL (ROCALTROL) 1.5 mcg, Oral, 3 times weekly, Monday, Wednesday, Friday  . CellCept 1,000 mg, Oral, 2 times daily  . cinacalcet (SENSIPAR) 30 mg, Oral, Daily  . Claritin 10 mg, Oral, Daily PRN  . diphenhydrAMINE (BENADRYL) 25 mg, Oral, As needed  . Dulcolax 5 mg, Oral, At bedtime PRN  . famotidine (PEPCID) 20 mg, Oral, Daily at bedtime  . ferric citrate (AURYXIA) 420 mg, Oral, 3 times daily with meals  . gentamicin cream (GARAMYCIN) 0.1 % 1 application, Topical, Daily  . hydrALAZINE (APRESOLINE) 25 mg, Oral, 2 times daily  . lactulose (CHRONULAC) 10 GM/15ML solution 30 mLs, Oral, As needed  . losartan (COZAAR) 100 mg, Oral, Daily at bedtime  . MIRALAX 17 GM/SCOOP powder 1 Container, Oral, As needed  . Nac 600 600 mg, Oral, Daily  . tacrolimus (PROGRAF) 2 mg, Oral, 2 times daily    Scheduled Meds: . acetaminophen  650 mg Oral Q6H WA   Or  . acetaminophen  650 mg Rectal Q6H WA  . amLODipine  10 mg Oral Daily  . azithromycin  250 mg Oral Daily  . calcitRIOL  0.5 mcg Oral Once per day on Mon Wed Fri  . Chlorhexidine Gluconate Cloth  6 each Topical Q0600  . cinacalcet  30 mg Oral Q breakfast  . darbepoetin (ARANESP) injection - DIALYSIS  200 mcg Intravenous Q Tue-HD  . diclofenac Sodium  2 g  Topical QID  . famotidine  20 mg Oral QHS  . insulin aspart  0-6 Units Subcutaneous TID WC  . isosorbide-hydrALAZINE  1 tablet Oral TID  . labetalol  200 mg Oral TID  . lidocaine  1 patch Transdermal Q24H  . losartan  50 mg Oral Daily  . multivitamin  1 tablet Oral QHS  . mycophenolate  500 mg Oral BID  . ramelteon  8 mg Oral QHS  . tacrolimus  3 mg Oral BID   Continuous Infusions: . ferric gluconate (FERRLECIT/NULECIT) IV Stopped (10/24/19 0949)  . methylPREDNISolone (SOLU-MEDROL) injection 250 mg (10/24/19 2325)   PRN Meds:.bisacodyl, labetalol, ondansetron (ZOFRAN) IV, promethazine  I/O last 3 completed shifts: In: 680 [P.O.:480; IV Piggyback:200] Out: 1868 [Other:1868] No intake/output data recorded.   Radiology:   Imaging: No results found.  Cardiac Studies:   EKG 10/21/2019: Sinus tachycardia at the rate of 125 bpm, leftward axis, incomplete right bundle branch block.  Nonspecific T abnormality.  Normal QT interval.  Assessment   1.  Hypertensive urgency with papilledema and visual disturbance. 2.  ESRD on peritoneal dialysis 3.  Secondary hyperparathyroidism  Recommendations:   Tara Mills  is a 24 y.o. Caucasian female patien with t with History of CKD and is being evaluated for repeat Transplant, follows [facility] team. Currently on peritoneal Dialysis. Was diagnosed with one kidney at age 15. At 24 years of age had transplant in [place] but has had graft failure. Moved to [facility] in May 2020. Started peritoneal dialysis in Dec 2020 in [place]. Was having HD prior to this.  Told to have severe secondary hyperparathyroidism.  No admitted for management of hypertension and papilledema that started about 10 days to 2 weeks ago.  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  She needs an echocardiogram.  No clinical evidence of heart failure.  She does  have underlying elevated potassium, we may have to discontinue losartan.  We will start her on labetalol  200 mg p.o. 3 times daily, BiDil 1 p.o. 3 times daily.  As patient has had elevated blood pressure, symptoms have been ongoing for the past 2 weeks, no urgency and starting IV therapy, symptoms are actually stabilized with regard to visual disturbance.  She probably will need much higher doses of the BiDil and also possibly need much higher doses of labetalol.  I would probably hold off on parathyroid surgery for now in view of the urgency, once stable, she should have parathyroidectomy which would certainly help with better control of blood pressure.  We will continue to follow with you.  I have placed orders for echocardiogram.  Adrian Prows, MD, [facility] 10/24/2019, 11:25 PM [facility]. [place] Pager: [phone] Office: [phone]

## 2019-10-24 NOTE — Progress Notes (Signed)
Subjective: Mild worsening possibly of vision.   Exam: Vitals:   10/24/19 1111 10/24/19 1555  BP: (!) 168/112 (!) 137/97  Pulse: 92 96  Resp: 18 18  Temp: 98.1 F (36.7 C) 98.1 F (36.7 C)  SpO2: 100% 100%   Gen: In bed, NAD Resp: non-labored breathing, no acute distress Abd: soft, nt  Neuro: MS: awake, alert, interactive and appropraite.  [id number], face symmetric Motor: 5/5 throughout Sensory:intact to LT   Impression: 24 YO with bilateral papilledema and rapidly progressive vision loss. CSF opening pressure within normal limits argues against pseudotumor. At this point believe her vision loss very well could be secondary to malignant HTN, however it be very difficult to rule out bilateral optic neuritis.  At this point, I continue favor treating for both possible etiologies.  Recommendations: 1) Continue solumedrol for total 5 days.  2) continue aggressive BP control.   Roland Rack, MD [facility] [phone]  If 7pm- 7am, please page neurology on call as listed in [place].

## 2019-10-24 NOTE — Progress Notes (Signed)
2310 Patient complaining of pain under left breast non radiating blood pressure 130/80.Ekg obtained shows sinus rhythm.Call placed to Dr.Alexander.Order received for lidocaine patch under left breast.

## 2019-10-24 NOTE — Progress Notes (Signed)
[facility] Progress Note   Assessment/ Plan:   Dialyzes at [facility],  CCPD, 4 exchanges, fill 2000 cc, dwell time 1.5 hour, no last fill, EDW 59 kg. Aranesp 200 mcg on 2/2, venofer 100 mg post HD.  Calcitriol 0.5 mcg 3x/week sensipar 30 mg daily auryxia 2 tabs TID   1. Bilateral vision loss: Imaging studies with no acute finding.  Neurology is following.  MRI negative.  Papilledema on exam. LP negative, opening pressure WNL, neuro following and is suspecting optic neuritis and malignant HTN.  Getting IV solumedrol.    2. ESRD: HD #1 2/16, HD #2 2/17, HD #3 today. Transitioning back to HD.     3 Hypertension: BP elevated, somewhat better with BP in the 140s this AM.  I would like to pursue more aggressive volume removal with HD as she does have JVD on exam, however she did not consent to have more than 1 kg off in HD today 2/18.  She's on amlodipine and hydralazine.  I'll add losartan 50 mg daily and follow.   4. Anemia of ESRD: Hemoglobin stable, below goal.  Order iron and Aranesp per tomorrow.  5. Metabolic Bone Disease: Resume Sensipar and Auryxia.  6.  Hyperkalemia: resolved s/p HD  7.  Metabolic acidosis: stop bicarb.   8.  Failed Ktx: on MMF and tacrolimus, will start tapering immunosuppression- will decrease MMF to 500 mg BID   Subjective:    HD #3 today.  Woke up from sleep on HD saying "I can't see", was able to wake up a little more and noted that her vision was OK.     Objective:   BP (!) 149/87   Pulse 88   Temp 97.8 F (36.6 C) (Oral)   Resp 18   Ht 5' (1.524 m)   Wt 59.1 kg   LMP 10/06/2019   SpO2 99%   BMI 25.45 kg/m   Physical Exam: Gen: young woman, sitting in bed, NAD HEENT: + JVD CVS: RRR no rub Resp: normal WOB clear R lung, L lung with some rhonchi Abd: soft, nontender, NABS Ext: no LE edema ACCESS: PD cath in place, R IJ TDC in place- dressings changed.    Labs: BMET Recent Labs  Lab 10/18/19 1202 10/21/19 1241  10/22/19 0250 10/23/19 0233 10/24/19 0051  NA 139 135 135 133* 133*  K 4.6 5.9* 5.5* 5.2* 4.1  CL 101 99 101 95* 93*  CO2 14* 10* 10* 16* 21*  GLUCOSE 99 86 107* 254* 201*  BUN 97* 119* 127* 73* 42*  CREATININE 23.98* 28.74* 39.17* 18.23* 10.30*  CALCIUM 8.0* 8.3* 7.7* 7.8* 8.7*  PHOS  --   --  >30.0* 11.1* 8.8*   CBC Recent Labs  Lab 10/18/19 1202 10/18/19 1202 10/21/19 1241 10/22/19 0250 10/23/19 0341 10/24/19 0051  WBC 6.4   < > 11.3* 10.5 4.8 7.7  NEUTROABS 5.0  --   --   --   --   --   HGB 8.2*   < > 8.3* 6.5* 8.9* 8.8*  HCT 26.1*   < > 27.7* 21.2* 26.6* 26.6*  MCV 102.0*   < > 105.3* 102.4* 93.0 95.0  PLT 163   < > 117* 102* 109* 155   < > = values in this interval not displayed.      Medications:    . acetaminophen  650 mg Oral Q6H WA   Or  . acetaminophen  650 mg Rectal Q6H WA  . amLODipine  10 mg Oral Daily  . azithromycin  250 mg Oral Daily  . calcitRIOL  0.5 mcg Oral Once per day on Mon Wed Fri  . Chlorhexidine Gluconate Cloth  6 each Topical Q0600  . cinacalcet  30 mg Oral Q breakfast  . darbepoetin (ARANESP) injection - DIALYSIS  200 mcg Intravenous Q Tue-HD  . diclofenac Sodium  2 g Topical QID  . famotidine  20 mg Oral QHS  . hydrALAZINE  50 mg Oral Q8H  . insulin aspart  0-6 Units Subcutaneous TID WC  . multivitamin  1 tablet Oral QHS  . mycophenolate  500 mg Oral BID  . ramelteon  8 mg Oral QHS  . tacrolimus  3 mg Oral BID     Tara Lips, MD 10/24/2019, 9:40 AM

## 2019-10-24 NOTE — Progress Notes (Signed)
Patient seen at the bedside after being paged about patient requesting another blood transfusion.  I spent about 10 minutes at the bedside talking to the patient.  The patient appeared anxious while speaking.  Patient stated that "she knows her body" and she thinks she needs another blood transfusion because she continues to have blurred vision.  I explained to the patient that although her blood counts are chronically low, it is likely unrelated to her vision problems currently.  She also states that when she was on her "death bed" back in 02-14-2023, she felt the same way and is afraid that something bad is going to happen if she does not get another transfusion. I have reassured the patient on her vital signs are stable, and her most recent blood counts showed an appropriate increase after receiving a blood transfusion on 2/16, and that we are monitoring her blood counts daily.  On physical exam, patient had normal S1-S2, no murmurs rubs or gallops appreciated.  Pale sclera noted. Capillary refill <2 seconds.  All extremities were warm to the touch.  Lungs were clear to auscultation bilaterally.  The patient was insistent on getting her blood counts checked immediately.  I will arrange to have a CBC collected at 1 AM today, instead of 5 AM.  Earlene Plater, MD Internal Medicine, PGY1 Pager: [phone]  10/24/2019,12:22 AM

## 2019-10-25 ENCOUNTER — Inpatient Hospital Stay (HOSPITAL_COMMUNITY): Payer: Medicaid Other

## 2019-10-25 ENCOUNTER — Encounter (HOSPITAL_COMMUNITY): Payer: Self-pay | Admitting: Internal Medicine

## 2019-10-25 LAB — GLUCOSE, CAPILLARY
Glucose-Capillary: 177 mg/dL — ABNORMAL HIGH (ref 70–99)
Glucose-Capillary: 288 mg/dL — ABNORMAL HIGH (ref 70–99)
Glucose-Capillary: 241 mg/dL — ABNORMAL HIGH (ref 70–99)
Glucose-Capillary: 203 mg/dL — ABNORMAL HIGH (ref 70–99)

## 2019-10-25 LAB — CBC
HCT: 22.9 % — ABNORMAL LOW (ref 36.0–46.0)
Hemoglobin: 7.2 g/dL — ABNORMAL LOW (ref 12.0–15.0)
MCH: 31.2 pg (ref 26.0–34.0)
MCHC: 31.4 g/dL (ref 30.0–36.0)
MCV: 99.1 fL (ref 80.0–100.0)
Platelets: 169 10*3/uL (ref 150–400)
RBC: 2.31 MIL/uL — ABNORMAL LOW (ref 3.87–5.11)
RDW: 15.2 % (ref 11.5–15.5)
WBC: 9.3 10*3/uL (ref 4.0–10.5)
nRBC: 1.6 % — ABNORMAL HIGH (ref 0.0–0.2)

## 2019-10-25 LAB — RENAL FUNCTION PANEL
Albumin: 2.7 g/dL — ABNORMAL LOW (ref 3.5–5.0)
Anion gap: 14 (ref 5–15)
BUN: 41 mg/dL — ABNORMAL HIGH (ref 6–20)
CO2: 23 mmol/L (ref 22–32)
Calcium: 8 mg/dL — ABNORMAL LOW (ref 8.9–10.3)
Chloride: 98 mmol/L (ref 98–111)
Creatinine, Ser: 7.37 mg/dL — ABNORMAL HIGH (ref 0.44–1.00)
GFR calc Af Amer: 8 mL/min — ABNORMAL LOW (ref >60)
GFR calc non Af Amer: 7 mL/min — ABNORMAL LOW (ref >60)
Glucose, Bld: 197 mg/dL — ABNORMAL HIGH (ref 70–99)
Phosphorus: 8.2 mg/dL — ABNORMAL HIGH (ref 2.5–4.6)
Potassium: 3.7 mmol/L (ref 3.5–5.1)
Sodium: 135 mmol/L (ref 135–145)

## 2019-10-25 LAB — CSF CULTURE W GRAM STAIN: Culture: NO GROWTH

## 2019-10-25 LAB — ECHOCARDIOGRAM COMPLETE
Height: 60 in
Weight: 2052.92 oz

## 2019-10-25 LAB — MAGNESIUM: Magnesium: 2.2 mg/dL (ref 1.7–2.4)

## 2019-10-25 MED ORDER — HYDROMORPHONE HCL 2 MG PO TABS
1.0000 mg | ORAL_TABLET | Freq: Once | ORAL | Status: AC
  Administered 2019-10-25: 1 mg via ORAL
  Filled 2019-10-25: qty 1

## 2019-10-25 MED ORDER — GENTAMICIN SULFATE 0.1 % EX CREA
1.0000 "application " | TOPICAL_CREAM | Freq: Every day | CUTANEOUS | Status: DC
  Administered 2019-10-25 – 2019-10-26 (×3): 1 via TOPICAL
  Filled 2019-10-25: qty 15

## 2019-10-25 MED ORDER — TACROLIMUS 1 MG PO CAPS
2.0000 mg | ORAL_CAPSULE | Freq: Two times a day (BID) | ORAL | Status: DC
  Administered 2019-10-25 – 2019-10-26 (×2): 2 mg via ORAL
  Filled 2019-10-25 (×3): qty 2

## 2019-10-25 MED ORDER — ISOSORB DINITRATE-HYDRALAZINE 20-37.5 MG PO TABS
2.0000 | ORAL_TABLET | Freq: Three times a day (TID) | ORAL | Status: DC
  Administered 2019-10-25 – 2019-10-26 (×3): 2 via ORAL
  Filled 2019-10-25 (×6): qty 2

## 2019-10-25 NOTE — Progress Notes (Signed)
Subjective: Significantly better, both BP and vision  Exam: Vitals:   10/25/19 1140 10/25/19 1558  BP: 118/83 117/84  Pulse: 82 79  Resp: 20 20  Temp: 97.6 F (36.4 C) 97.7 F (36.5 C)  SpO2: 99% 100%   Gen: In bed, NAD Resp: non-labored breathing, no acute distress Abd: soft, nt  Neuro: MS: awake, alert, interactive and appropraite.  [id number], face symmetric, able to count fingers well across the room Motor: 5/5 throughout Sensory:intact to LT   Impression: 24 YO with bilateral papilledema and rapidly progressive vision loss. CSF opening pressure within normal limits argues against pseudotumor as does her improvement. At this point I believe her vision loss very well could be secondary to malignant HTN, however it be very difficult to rule out bilateral optic neuritis.  Her improvement is very reassuring, I would finish out her fifth day of steroids, and continue aggressive blood pressure control   Recommendations: 1) Continue solumedrol for total 5 days.  2) continue aggressive BP control.  3) I think she would most benefit from follow-up with a neuro-ophthalmologist, could consider [facility].  Roland Rack, MD [facility] [phone]  If 7pm- 7am, please page neurology on call as listed in [place].

## 2019-10-25 NOTE — Progress Notes (Addendum)
Subjective: HD#4 Patient had serial HD yesterday with 800cc removed. Overnight, patient with nonradiating chest pain under left breast. EKG in sinus rhythm and BP normotensive. Improved with lidocaine patch.   This morning, patient evaluated at bedside. She reports that her vision is still blurry today, feels like her vision is "on and off", feels like the color has improved a bit. She reports that she has more difficulty seeing when she first wakes up. Discussed plan to finish a 5 day course of steroids, this would finish tomorrow. She has been able to get her glasses today.   Objective:  Vital signs in last 24 hours: Vitals:   10/24/19 1555 10/24/19 2037 10/24/19 2303 10/25/19 0458  BP: (!) 137/97 129/80 130/84 (!) 143/96  Pulse: 96 86 79 80  Resp: 18 17 18 17   Temp: 98.1 F (36.7 C) 98 F (36.7 C) 97.7 F (36.5 C) 97.9 F (36.6 C)  TempSrc: Oral Oral Oral Oral  SpO2: 100%  100% 98%  Weight:      Height:       CBC Latest Ref Rng & Units 10/25/2019 10/24/2019 10/23/2019  WBC 4.0 - 10.5 K/uL 9.3 7.7 4.8  Hemoglobin 12.0 - 15.0 g/dL 7.2(L) 8.8(L) 8.9(L)  Hematocrit 36.0 - 46.0 % 22.9(L) 26.6(L) 26.6(L)  Platelets 150 - 400 K/uL 169 155 109(L)   CMP Latest Ref Rng & Units 10/25/2019 10/24/2019 10/23/2019  Glucose 70 - 99 mg/dL 197(H) 201(H) 254(H)  BUN 6 - 20 mg/dL 41(H) 42(H) 73(H)  Creatinine 0.44 - 1.00 mg/dL 7.37(H) 10.30(H) 18.23(H)  Sodium 135 - 145 mmol/L 135 133(L) 133(L)  Potassium 3.5 - 5.1 mmol/L 3.7 4.1 5.2(H)  Chloride 98 - 111 mmol/L 98 93(L) 95(L)  CO2 22 - 32 mmol/L 23 21(L) 16(L)  Calcium 8.9 - 10.3 mg/dL 8.0(L) 8.7(L) 7.8(L)  Total Protein 6.5 - 8.1 g/dL - - -  Total Bilirubin 0.3 - 1.2 mg/dL - - -  Alkaline Phos 38 - 126 U/L - - -  AST 15 - 41 U/L - - -  ALT 0 - 44 U/L - - -   Physical Exam  Constitutional: She is oriented to person, place, and time and well-developed, well-nourished, and in no distress.  HENT:  Head: Normocephalic and atraumatic.  Eyes:  Conjunctivae and EOM are normal.  Cardiovascular: Regular rhythm, normal heart sounds and intact distal pulses. Tachycardia present. Exam reveals no gallop and no friction rub.  No murmur heard. Pulmonary/Chest: Effort normal and breath sounds normal. No respiratory distress. She has no wheezes. She has no rales.  Abdominal: Soft. Bowel sounds are normal. She exhibits no distension. There is no abdominal tenderness. There is no rebound.  Musculoskeletal:        General: Normal range of motion.     Comments: Tenderness to palpation of R flank, worse with deep inspiration  Neurological: She is alert and oriented to person, place, and time.  Vision improved today to 20/25 with glasses on snellen chart (20/50 yesterday)   Skin: Skin is warm and dry. No rash noted. No erythema.    Assessment/Plan: [name] is a 24year old female with history of ESRD on PD and anemia of chronic disease presenting with bilateral vision loss, noted to have papilledema on ophthalmologic exam.   Bilateral vision loss:  Hypertensive emergency: Optic neuritis: Patient with blurry vision, decreased color perception, and floaters in bilateral vision fields presented from ophthalmologist office with papilledema.LP wnl and opening pressure 19. MRI brain and orbits  without any acute findings. Discussed concerns for ongoing papilledema with ophthalmologist - recommended for continued medical management to optimize blood pressure and follow up closely as outpatient. Cardiology was consulted to assist with BP control. Patient started on labetalol 200mg  tid and BiDil 1mg  tid with improvement in her SBP 130-140s and DBP 80-90s. Continued volume removal via dialysis will also benefit her blood pressure.  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  Patient has been receiving serial HD's during admission and tolerating well with 2.4L removed today. Patient has been set up with outpatient dialysis center on discharge. Expecting discharge in next 1-2 days.  - Nephrology consulted, appreciate their recommendations  - HD per nephrology  - Sodium bicarb 1300mg  bid - Calcitriol 1.28mcg MWF - Cinacalcet 30mg  daily - Tacrolimus 3mg  bid  Acute on chronic anemia: Thrombocytopenia:  Hb stable this morning at 8.8. Patient hemodynamically stable. - Iron and aranesp per nephrology  - CBC daily  FEN/GI:  Diet: Renal Fluids: None Electrolytes: Monitor and replete prn  VTE Prophylaxis: SCDs, Heparin during dialysis  Code status: FULL  Prior to Admission Living Arrangement: Home Anticipated Discharge Location: Home Barriers to Discharge: Continued medical management  [name], MD  Internal Medicine, PGY-1 Pager: [phone] 10/25/2019, 6:29 AM

## 2019-10-25 NOTE — Progress Notes (Signed)
Subjective:  Feels much better and states for the first time DBP <100 mm Hg.  Vision slightly better. No headache or nausea or vomiting  Intake/Output from previous day:  I/O last 3 completed shifts: In: 530 [P.O.:480; IV Piggyback:50] Out: 868 [Other:868] No intake/output data recorded.  Blood pressure 118/83, pulse 82, temperature 97.6 F (36.4 C), temperature source Oral, resp. rate 20, height 5' (1.524 m), weight 58.2 kg, last menstrual period 10/06/2019, SpO2 99 %.   Vitals with BMI 10/25/2019 10/25/2019 10/25/2019  Height - - -  Weight - - -  BMI - - -  Systolic 118 148 143  Diastolic 83 101 96  Pulse 82 79 80    Physical Exam  Cardiovascular: Normal rate, regular rhythm and intact distal pulses. Exam reveals gallop and S4.  No murmur heard. No leg edema, no JVD.  Pulmonary/Chest: Effort normal and breath sounds normal.  Abdominal: Soft. Bowel sounds are normal.  Skin: Skin is warm and dry.    Lab Results: BMP BNP (last 3 results) No results for input(s): BNP in the last 8760 hours.  ProBNP (last 3 results) No results for input(s): PROBNP in the last 8760 hours. BMP Latest Ref Rng & Units 10/25/2019 10/24/2019 10/23/2019  Glucose 70 - 99 mg/dL 197(H) 201(H) 254(H)  BUN 6 - 20 mg/dL 41(H) 42(H) 73(H)  Creatinine 0.44 - 1.00 mg/dL 7.37(H) 10.30(H) 18.23(H)  Sodium 135 - 145 mmol/L 135 133(L) 133(L)  Potassium 3.5 - 5.1 mmol/L 3.7 4.1 5.2(H)  Chloride 98 - 111 mmol/L 98 93(L) 95(L)  CO2 22 - 32 mmol/L 23 21(L) 16(L)  Calcium 8.9 - 10.3 mg/dL 8.0(L) 8.7(L) 7.8(L)   Hepatic Function Latest Ref Rng & Units 10/25/2019 10/24/2019 10/23/2019  Total Protein 6.5 - 8.1 g/dL - - -  Albumin 3.5 - 5.0 g/dL 2.7(L) 3.0(L) 2.7(L)  AST 15 - 41 U/L - - -  ALT 0 - 44 U/L - - -  Alk Phosphatase 38 - 126 U/L - - -  Total Bilirubin 0.3 - 1.2 mg/dL - - -  Bilirubin, Direct 0.0 - 0.2 mg/dL - - -   CBC Latest Ref Rng & Units 10/25/2019 10/24/2019 10/23/2019  WBC 4.0 - 10.5 K/uL 9.3 7.7 4.8   Hemoglobin 12.0 - 15.0 g/dL 7.2(L) 8.8(L) 8.9(L)  Hematocrit 36.0 - 46.0 % 22.9(L) 26.6(L) 26.6(L)  Platelets 150 - 400 K/uL 169 155 109(L)   Lipid Panel  No results found for: CHOL, TRIG, HDL, CHOLHDL, VLDL, LDLCALC, LDLDIRECT Cardiac Panel (last 3 results) No results for input(s): CKTOTAL, CKMB, TROPONINI, RELINDX in the last 72 hours.  HEMOGLOBIN A1C Lab Results  Component Value Date   HGBA1C 4.4 (L) 10/23/2019   MPG 79.58 10/23/2019   TSH No results for input(s): TSH in the last 8760 hours. Imaging: Imaging results have been reviewed  Cardiac Studies: EKG 10/21/2019: Sinus tachycardia at the rate of 125 bpm, leftward axis, incomplete right bundle branch block.  Nonspecific T abnormality.  Normal QT interval.   Echocardiogram 10/25/2019:  1. Left ventricular ejection fraction, by estimation, is 60 to 65%. The  left ventricle has normal function. The left ventricle has no regional  wall motion abnormalities. Left ventricular diastolic parameters are  consistent with Grade II diastolic  dysfunction (pseudonormalization).  2. Right ventricular systolic function is normal. The right ventricular  size is normal. There is mildly elevated pulmonary artery systolic  pressure.  3. Peak TR gradient 29 mm Hg, PASP estimated at 32 mm Hg consistent with    mild pulmonary hypertension. . Tricuspid valve regurgitation is mild to  moderate.  4. The inferior vena cava is normal in size with greater than 50%  respiratory variability, suggesting right atrial pressure of 3 mmHg.  5. A small pericardial effusion is present. The pericardial effusion is  circumferential. The pericardial effusion appears to contain Clear fluid.  There is no evidence of cardiac tamponade.   Scheduled Meds: . acetaminophen  650 mg Oral Q6H WA   Or  . acetaminophen  650 mg Rectal Q6H WA  . amLODipine  10 mg Oral Daily  . calcitRIOL  0.5 mcg Oral Once per day on Mon Wed Fri  . Chlorhexidine Gluconate  Cloth  6 each Topical Q0600  . cinacalcet  30 mg Oral Q breakfast  . darbepoetin (ARANESP) injection - DIALYSIS  200 mcg Intravenous Q Tue-HD  . diclofenac Sodium  2 g Topical QID  . famotidine  20 mg Oral QHS  . insulin aspart  0-6 Units Subcutaneous TID WC  . isosorbide-hydrALAZINE  2 tablet Oral TID  . labetalol  200 mg Oral TID  . lidocaine  1 patch Transdermal Q24H  . losartan  50 mg Oral Daily  . multivitamin  1 tablet Oral QHS  . mycophenolate  500 mg Oral BID  . ramelteon  8 mg Oral QHS  . tacrolimus  3 mg Oral BID   Continuous Infusions: . ferric gluconate (FERRLECIT/NULECIT) IV Stopped (10/24/19 0949)  . methylPREDNISolone (SOLU-MEDROL) injection 250 mg (10/25/19 0524)   PRN Meds:.bisacodyl, labetalol, ondansetron (ZOFRAN) IV, promethazine  Assessment/Plan:  1.  Hypertensive urgency with papilledema and visual disturbance. 2.  ESRD on peritoneal dialysis 3.  Secondary hyperparathyroidism  Rec: She is feeling better, doubt optic neuritis in view of absent MRI findings, but agree with neuro to continue short course steroids. BP should improve after steroids are tapered off as well.  Increase BiDil to 2 cap TID. Hopefully home today to tomorrow.    Adrian Prows, M.D. 10/25/2019, 11:42 AM [facility] Pager: [phone] Office: [phone] If no answer: [phone]

## 2019-10-25 NOTE — Progress Notes (Signed)
PD dressing changed per orders. Patient alert oriented vitals stable.

## 2019-10-25 NOTE — Progress Notes (Addendum)
[facility] Progress Note   Assessment/ Plan:   Dialyzes at [facility],  [facility], 4 exchanges, fill 2000 cc, dwell time 1.5 hour, no last fill, EDW 59 kg. Aranesp 200 mcg on 2/2, venofer 100 mg post HD.  Calcitriol 0.5 mcg 3x/week sensipar 30 mg daily auryxia 2 tabs TID   1. Bilateral vision loss: Imaging studies with no acute finding.  Neurology is following.  MRI negative.  Papilledema on exam. LP negative, opening pressure WNL, neuro following and is suspecting optic neuritis and malignant HTN.  Getting IV solumedrol.    2. ESRD: HD #1 2/16, HD #2 2/17, HD #3 2/18. Transitioning back to HD.  She reports overheating and feeling poorly during HD.  We are double rinsing the dialyzer here but may be helpful to see if a hypoallergenic dialyzer helps her symptoms as OP (we don't have them here inpatient).  Next planned 2/20.  Exit site care for PD.    3 Hypertension: Very hypertensive initially--> getting better with meds.  I would like to pursue more aggressive volume removal with HD as she does have JVD on exam, however she did not consent to have more than 1 kg off in HD.  I think that her BP problem will persist unless she gets more aggressive volume removal.  She's on amlodipine and previously hydralazine, I added losartan 50 mg daily yesterday, cardiology c/s with labetalol and BiDil.     4. Anemia of ESRD: Hemoglobin stable, below goal.  ON iron and ESA.    5. Metabolic Bone Disease: Resume Sensipar and Auryxia.  6.  Hyperkalemia: resolved s/p HD  7.  Metabolic acidosis: stop bicarb.   8.  Failed Ktx: on MMF and tacrolimus, will start tapering immunosuppression- will decrease MMF to 500 mg BID.  Addend: d/w pharmacy, apparently pt prescribed 2 mg BID tacrolimus, will change from 3 mg BID to 2 mg BID.  Appreciate assistance.  Subjective:    Seen in room.  S/p HD #3.  Would only agree to 1L UF.  Cards c/s yesterday, BP meds added.     Objective:   BP 118/83 (BP  Location: Left Arm)   Pulse 82   Temp 97.6 F (36.4 C) (Oral)   Resp 20   Ht 5' (1.524 m)   Wt 58.2 kg   LMP 10/06/2019   SpO2 99%   BMI 25.06 kg/m   Physical Exam: Gen: young woman, sitting in bed, NAD HEENT: + JVD CVS: RRR no rub Resp: normal WOB clear R lung, L lung clear Abd: soft, nontender, NABS Ext: no LE edema ACCESS: PD cath in place, R IJ TDC in place- dressings changed.    Labs: BMET Recent Labs  Lab 10/18/19 1202 10/21/19 1241 10/22/19 0250 10/23/19 0233 10/24/19 0051 10/25/19 0443  NA 139 135 135 133* 133* 135  K 4.6 5.9* 5.5* 5.2* 4.1 3.7  CL 101 99 101 95* 93* 98  CO2 14* 10* 10* 16* 21* 23  GLUCOSE 99 86 107* 254* 201* 197*  BUN 97* 119* 127* 73* 42* 41*  CREATININE 23.98* 28.74* 39.17* 18.23* 10.30* 7.37*  CALCIUM 8.0* 8.3* 7.7* 7.8* 8.7* 8.0*  PHOS  --   --  >30.0* 11.1* 8.8* 8.2*   CBC Recent Labs  Lab 10/18/19 1202 10/21/19 1241 10/22/19 0250 10/23/19 0341 10/24/19 0051 10/25/19 0443  WBC 6.4   < > 10.5 4.8 7.7 9.3  NEUTROABS 5.0  --   --   --   --   --  HGB 8.2*   < > 6.5* 8.9* 8.8* 7.2*  HCT 26.1*   < > 21.2* 26.6* 26.6* 22.9*  MCV 102.0*   < > 102.4* 93.0 95.0 99.1  PLT 163   < > 102* 109* 155 169   < > = values in this interval not displayed.      Medications:    . acetaminophen  650 mg Oral Q6H WA   Or  . acetaminophen  650 mg Rectal Q6H WA  . amLODipine  10 mg Oral Daily  . calcitRIOL  0.5 mcg Oral Once per day on Mon Wed Fri  . Chlorhexidine Gluconate Cloth  6 each Topical Q0600  . cinacalcet  30 mg Oral Q breakfast  . darbepoetin (ARANESP) injection - DIALYSIS  200 mcg Intravenous Q Tue-HD  . diclofenac Sodium  2 g Topical QID  . famotidine  20 mg Oral QHS  . gentamicin cream  1 application Topical Daily  . insulin aspart  0-6 Units Subcutaneous TID WC  . isosorbide-hydrALAZINE  2 tablet Oral TID  . labetalol  200 mg Oral TID  . lidocaine  1 patch Transdermal Q24H  . losartan  50 mg Oral Daily  . multivitamin   1 tablet Oral QHS  . mycophenolate  500 mg Oral BID  . ramelteon  8 mg Oral QHS  . tacrolimus  3 mg Oral BID     Madelon Lips, MD 10/25/2019, 11:52 AM

## 2019-10-25 NOTE — Progress Notes (Signed)
  Echocardiogram 2D Echocardiogram has been performed.  Darlina Sicilian M 10/25/2019, 9:57 AM

## 2019-10-26 DIAGNOSIS — N2581 Secondary hyperparathyroidism of renal origin: Secondary | ICD-10-CM

## 2019-10-26 DIAGNOSIS — I160 Hypertensive urgency: Secondary | ICD-10-CM

## 2019-10-26 LAB — CBC
HCT: 24.2 % — ABNORMAL LOW (ref 36.0–46.0)
Hemoglobin: 7.6 g/dL — ABNORMAL LOW (ref 12.0–15.0)
MCH: 30.9 pg (ref 26.0–34.0)
MCHC: 31.4 g/dL (ref 30.0–36.0)
MCV: 98.4 fL (ref 80.0–100.0)
Platelets: 218 10*3/uL (ref 150–400)
RBC: 2.46 MIL/uL — ABNORMAL LOW (ref 3.87–5.11)
RDW: 14.6 % (ref 11.5–15.5)
WBC: 9.3 10*3/uL (ref 4.0–10.5)
nRBC: 1.9 % — ABNORMAL HIGH (ref 0.0–0.2)

## 2019-10-26 LAB — RENAL FUNCTION PANEL
Albumin: 2.9 g/dL — ABNORMAL LOW (ref 3.5–5.0)
Anion gap: 18 — ABNORMAL HIGH (ref 5–15)
BUN: 64 mg/dL — ABNORMAL HIGH (ref 6–20)
CO2: 20 mmol/L — ABNORMAL LOW (ref 22–32)
Calcium: 7.7 mg/dL — ABNORMAL LOW (ref 8.9–10.3)
Chloride: 97 mmol/L — ABNORMAL LOW (ref 98–111)
Creatinine, Ser: 9.41 mg/dL — ABNORMAL HIGH (ref 0.44–1.00)
GFR calc Af Amer: 6 mL/min — ABNORMAL LOW (ref >60)
GFR calc non Af Amer: 5 mL/min — ABNORMAL LOW (ref >60)
Glucose, Bld: 201 mg/dL — ABNORMAL HIGH (ref 70–99)
Phosphorus: 9 mg/dL — ABNORMAL HIGH (ref 2.5–4.6)
Potassium: 3.9 mmol/L (ref 3.5–5.1)
Sodium: 135 mmol/L (ref 135–145)

## 2019-10-26 LAB — GLUCOSE, CAPILLARY
Glucose-Capillary: 198 mg/dL — ABNORMAL HIGH (ref 70–99)
Glucose-Capillary: 239 mg/dL — ABNORMAL HIGH (ref 70–99)

## 2019-10-26 LAB — MAGNESIUM: Magnesium: 2.3 mg/dL (ref 1.7–2.4)

## 2019-10-26 MED ORDER — HEPARIN SODIUM (PORCINE) 1000 UNIT/ML IJ SOLN
INTRAMUSCULAR | Status: AC
  Administered 2019-10-26: 1000 [IU]
  Filled 2019-10-26: qty 4

## 2019-10-26 MED ORDER — LABETALOL HCL 200 MG PO TABS: 200.0000 mg | ORAL_TABLET | Freq: Three times a day (TID) | ORAL | 0 refills | Status: AC

## 2019-10-26 MED ORDER — ISOSORB DINITRATE-HYDRALAZINE 20-37.5 MG PO TABS: 2.0000 | ORAL_TABLET | Freq: Three times a day (TID) | ORAL | 0 refills | Status: AC

## 2019-10-26 MED ORDER — MYCOPHENOLATE MOFETIL 250 MG PO CAPS: 500.0000 mg | ORAL_CAPSULE | Freq: Two times a day (BID) | ORAL | 0 refills | Status: AC

## 2019-10-26 MED ORDER — CALCITRIOL 0.5 MCG PO CAPS
ORAL_CAPSULE | ORAL | Status: AC
  Filled 2019-10-26: qty 2

## 2019-10-26 MED ORDER — RENA-VITE PO TABS: 1.0000 | ORAL_TABLET | Freq: Every day | ORAL | 0 refills | Status: AC

## 2019-10-26 MED ORDER — CALCITRIOL 0.5 MCG PO CAPS: 0.5000 ug | ORAL_CAPSULE | ORAL | 0 refills | Status: AC

## 2019-10-26 MED ORDER — LOSARTAN POTASSIUM 50 MG PO TABS: 50.0000 mg | ORAL_TABLET | Freq: Every day | ORAL | 0 refills | Status: DC

## 2019-10-26 MED ORDER — CINACALCET HCL 30 MG PO TABS: 30.0000 mg | ORAL_TABLET | Freq: Every day | ORAL | 0 refills | Status: AC

## 2019-10-26 MED ORDER — RAMELTEON 8 MG PO TABS: 8.0000 mg | ORAL_TABLET | Freq: Every day | ORAL | 0 refills | Status: AC

## 2019-10-26 NOTE — Discharge Instructions (Signed)
Ms. Stormy Fabian,  You were admitted for vision changes in setting of uncontrolled hypertension. You were treated with aggressive blood pressure control and also with steroids for concerns of optic neuritis. Your symptoms improved. Please schedule a visit with the neuro-ophthalmologist at [facility] at your earliest convenience.   You have been set up at the [facility] dialysis center for hemodialysis on Tuesday, Thursday and Saturdays around 7am. Please go to dialysis on Tuesday at your scheduled time. You will be contacted with further details. On discharge, please continue to take your amlodipine 10mg  daily, labetalol 200mg  three times daily, and Bidil (isosorbide-hydralazine) 2 tablets three times daily. Please schedule a follow up appointment with your PCP and endocrinologist.  You have an appointment at [facility] for hospital follow up on 3/4. If you continue to have uncontrolled hypertension, you are recommended to continue following with the cardiologist.    Thank you!

## 2019-10-26 NOTE — Care Management (Signed)
Added Tara Mills to AVS for patient to call Monday to schedule an appointment for establishing a PCP

## 2019-10-26 NOTE — Progress Notes (Signed)
[facility] Progress Note   Assessment/ Plan:   Dialyzes at [facility],  [facility], 4 exchanges, fill 2000 cc, dwell time 1.5 hour, no last fill, EDW 59 kg. Aranesp 200 mcg on 2/2, venofer 100 mg post HD.  Calcitriol 0.5 mcg 3x/week sensipar 30 mg daily auryxia 2 tabs TID   1. Bilateral vision loss: Imaging studies with no acute finding.  Neurology is following.  MRI negative.  Papilledema on exam. LP negative, opening pressure WNL, neuro following and is suspecting optic neuritis and malignant HTN.  Getting IV solumedrol.    2. ESRD: HD #1 2/16, HD #2 2/17, HD #3 2/18. Transitioning back to HD.  She reports overheating and feeling poorly during HD.  We are double rinsing the dialyzer here but may be helpful to see if a hypoallergenic dialyzer helps her symptoms as OP (we don't have them here inpatient).  Next planned 2/20, agreed to bigger UF goal today.  Exit site care for PD.    3 Hypertension: Very hypertensive initially--> getting better with meds.  I would like to pursue more aggressive volume removal with HD as she does have JVD on exam, however she did not consent to have more than 1 kg off in HD.  I think that her BP problem will persist unless she gets more aggressive volume removal.  She's on amlodipine and previously hydralazine, I added losartan 50 mg daily yesterday, cardiology c/s with labetalol and BiDil.     4. Anemia of ESRD: Hemoglobin stable, below goal.  ON iron and ESA.    5. Metabolic Bone Disease: Resume Sensipar and Auryxia.  6.  Hyperkalemia: resolved s/p HD  7.  Metabolic acidosis: stop bicarb.   8.  Failed Ktx: on MMF and tacrolimus, will start tapering immunosuppression- will decrease MMF to 500 mg BID.  Addend: d/w pharmacy, apparently pt prescribed 2 mg BID tacrolimus, will change from 3 mg BID to 2 mg BID.  Appreciate assistance.  Subjective:    On HD today.  For 2L UF goal today.     Objective:   BP (!) 153/93 (BP Location: Left Arm)    Pulse 82   Temp 98.2 F (36.8 C) (Oral)   Resp 16   Ht 5' (1.524 m)   Wt 62.5 kg   LMP 10/06/2019   SpO2 99%   BMI 26.91 kg/m   Physical Exam: Gen: young woman, sitting in bed, NAD HEENT: JVD improving CVS: RRR no rub Resp: normal WOB clear R lung, L lung clear Abd: soft, nontender, NABS Ext: no LE edema ACCESS: PD cath in place, R IJ TDC in place- dressings changed.    Labs: BMET Recent Labs  Lab 10/21/19 1241 10/22/19 0250 10/23/19 0233 10/24/19 0051 10/25/19 0443 10/26/19 0359  NA 135 135 133* 133* 135 135  K 5.9* 5.5* 5.2* 4.1 3.7 3.9  CL 99 101 95* 93* 98 97*  CO2 10* 10* 16* 21* 23 20*  GLUCOSE 86 107* 254* 201* 197* 201*  BUN 119* 127* 73* 42* 41* 64*  CREATININE 28.74* 39.17* 18.23* 10.30* 7.37* 9.41*  CALCIUM 8.3* 7.7* 7.8* 8.7* 8.0* 7.7*  PHOS  --  >30.0* 11.1* 8.8* 8.2* 9.0*   CBC Recent Labs  Lab 10/23/19 0341 10/24/19 0051 10/25/19 0443 10/26/19 0359  WBC 4.8 7.7 9.3 9.3  HGB 8.9* 8.8* 7.2* 7.6*  HCT 26.6* 26.6* 22.9* 24.2*  MCV 93.0 95.0 99.1 98.4  PLT 109* 155 169 218      Medications:    .  acetaminophen  650 mg Oral Q6H WA   Or  . acetaminophen  650 mg Rectal Q6H WA  . amLODipine  10 mg Oral Daily  . calcitRIOL  0.5 mcg Oral Once per day on Mon Wed Fri  . Chlorhexidine Gluconate Cloth  6 each Topical Q0600  . cinacalcet  30 mg Oral Q breakfast  . darbepoetin (ARANESP) injection - DIALYSIS  200 mcg Intravenous Q Tue-HD  . diclofenac Sodium  2 g Topical QID  . famotidine  20 mg Oral QHS  . gentamicin cream  1 application Topical Daily  . insulin aspart  0-6 Units Subcutaneous TID WC  . isosorbide-hydrALAZINE  2 tablet Oral TID  . labetalol  200 mg Oral TID  . lidocaine  1 patch Transdermal Q24H  . losartan  50 mg Oral Daily  . multivitamin  1 tablet Oral QHS  . mycophenolate  500 mg Oral BID  . ramelteon  8 mg Oral QHS  . tacrolimus  2 mg Oral BID     Madelon Lips, MD 10/26/2019, 8:54 AM

## 2019-10-26 NOTE — Progress Notes (Signed)
Subjective:  Feels much better and vision continues to be better and now able to appreciate colors better. No headache or nausea or vomiting  Intake/Output from previous day:  I/O last 3 completed shifts: In: 2 [IV Piggyback:50] Out: -  No intake/output data recorded.  Blood pressure (!) 153/93, pulse 82, temperature 98.2 F (36.8 C), temperature source Oral, resp. rate 16, height 5' (1.524 m), weight 62.5 kg, last menstrual period 10/06/2019, SpO2 99 %.   Vitals with BMI 10/26/2019 10/26/2019 10/26/2019  Height - - -  Weight - - 137 lbs 13 oz  BMI - - 68.08  Systolic 811 031 594  Diastolic 93 91 85  Pulse 82 80 85    Physical Exam  Cardiovascular: Normal rate, regular rhythm and intact distal pulses. Exam reveals gallop and S4.  No murmur heard. No leg edema, no JVD.  Pulmonary/Chest: Effort normal and breath sounds normal.  Abdominal: Soft. Bowel sounds are normal.  Skin: Skin is warm and dry.    Lab Results: BMP BNP (last 3 results) No results for input(s): BNP in the last 8760 hours.  ProBNP (last 3 results) No results for input(s): PROBNP in the last 8760 hours. BMP Latest Ref Rng & Units 10/26/2019 10/25/2019 10/24/2019  Glucose 70 - 99 mg/dL 201(H) 197(H) 201(H)  BUN 6 - 20 mg/dL 64(H) 41(H) 42(H)  Creatinine 0.44 - 1.00 mg/dL 9.41(H) 7.37(H) 10.30(H)  Sodium 135 - 145 mmol/L 135 135 133(L)  Potassium 3.5 - 5.1 mmol/L 3.9 3.7 4.1  Chloride 98 - 111 mmol/L 97(L) 98 93(L)  CO2 22 - 32 mmol/L 20(L) 23 21(L)  Calcium 8.9 - 10.3 mg/dL 7.7(L) 8.0(L) 8.7(L)   Hepatic Function Latest Ref Rng & Units 10/26/2019 10/25/2019 10/24/2019  Total Protein 6.5 - 8.1 g/dL - - -  Albumin 3.5 - 5.0 g/dL 2.9(L) 2.7(L) 3.0(L)  AST 15 - 41 U/L - - -  ALT 0 - 44 U/L - - -  Alk Phosphatase 38 - 126 U/L - - -  Total Bilirubin 0.3 - 1.2 mg/dL - - -  Bilirubin, Direct 0.0 - 0.2 mg/dL - - -   CBC Latest Ref Rng & Units 10/26/2019 10/25/2019 10/24/2019  WBC 4.0 - 10.5 K/uL 9.3 9.3 7.7   Hemoglobin 12.0 - 15.0 g/dL 7.6(L) 7.2(L) 8.8(L)  Hematocrit 36.0 - 46.0 % 24.2(L) 22.9(L) 26.6(L)  Platelets 150 - 400 K/uL 218 169 155   Lipid Panel  No results found for: CHOL, TRIG, HDL, CHOLHDL, VLDL, LDLCALC, LDLDIRECT Cardiac Panel (last 3 results) No results for input(s): CKTOTAL, CKMB, TROPONINI, RELINDX in the last 72 hours.  HEMOGLOBIN A1C Lab Results  Component Value Date   HGBA1C 4.4 (L) 10/23/2019   MPG 79.58 10/23/2019   TSH No results for input(s): TSH in the last 8760 hours. Imaging: Imaging results have been reviewed  Cardiac Studies: EKG 10/21/2019: Sinus tachycardia at the rate of 125 bpm, leftward axis, incomplete right bundle branch block.  Nonspecific T abnormality.  Normal QT interval.   Echocardiogram 10/25/2019:  1. Left ventricular ejection fraction, by estimation, is 60 to 65%. The  left ventricle has normal function. The left ventricle has no regional  wall motion abnormalities. Left ventricular diastolic parameters are  consistent with Grade II diastolic  dysfunction (pseudonormalization).  2. Right ventricular systolic function is normal. The right ventricular  size is normal. There is mildly elevated pulmonary artery systolic  pressure.  3. Peak TR gradient 29 mm Hg, PASP estimated at 32 mm Hg consistent  with  mild pulmonary hypertension. . Tricuspid valve regurgitation is mild to  moderate.  4. The inferior vena cava is normal in size with greater than 50%  respiratory variability, suggesting right atrial pressure of 3 mmHg.  5. A small pericardial effusion is present. The pericardial effusion is  circumferential. The pericardial effusion appears to contain Clear fluid.  There is no evidence of cardiac tamponade.   Scheduled Meds: . acetaminophen  650 mg Oral Q6H WA   Or  . acetaminophen  650 mg Rectal Q6H WA  . amLODipine  10 mg Oral Daily  . calcitRIOL  0.5 mcg Oral Once per day on Mon Wed Fri  . Chlorhexidine Gluconate Cloth   6 each Topical Q0600  . cinacalcet  30 mg Oral Q breakfast  . darbepoetin (ARANESP) injection - DIALYSIS  200 mcg Intravenous Q Tue-HD  . diclofenac Sodium  2 g Topical QID  . famotidine  20 mg Oral QHS  . gentamicin cream  1 application Topical Daily  . insulin aspart  0-6 Units Subcutaneous TID WC  . isosorbide-hydrALAZINE  2 tablet Oral TID  . labetalol  200 mg Oral TID  . lidocaine  1 patch Transdermal Q24H  . losartan  50 mg Oral Daily  . multivitamin  1 tablet Oral QHS  . mycophenolate  500 mg Oral BID  . ramelteon  8 mg Oral QHS  . tacrolimus  2 mg Oral BID   Continuous Infusions: . ferric gluconate (FERRLECIT/NULECIT) IV Stopped (10/24/19 0949)  . methylPREDNISolone (SOLU-MEDROL) injection 250 mg (10/26/19 0616)   PRN Meds:.bisacodyl, labetalol, ondansetron (ZOFRAN) IV, promethazine  Assessment/Plan:  1.  Hypertensive urgency with papilledema and visual disturbance. 2.  ESRD on peritoneal dialysis and now HD 3.  Secondary hyperparathyroidism needs surgical evaluation and I am fine for her to proceed with surgery at anytime now. 4. Hypertensive heart disease with Grade II diastolic dysfunctinon 5. Pericardial effusion  Rec: She is feeling better, doubt optic neuritis in view of absent MRI findings, but agree with neuro to continue short course steroids. BP much improved on BiDil to 2 cap TID.   Pericardial effusion is non consequential in view of ESRD. I will sign off and she can f/u with [place] as needed.   Adrian Prows, M.D. 10/26/2019, 9:10 AM [facility] Pager: [phone] Office: [phone] If no answer: [phone]

## 2019-10-26 NOTE — Discharge Summary (Signed)
Name: Tara Mills MRN: [id number] DOB: [birth year] 24 y.o. PCP: Patient, No Pcp Per  Date of Admission: 10/21/2019 12:30 PM Date of Discharge: 10/26/2019 Attending Physician: Dr. Aldine Contes  Discharge Diagnosis: 1. Hypertensive urgency with papilledema and vision loss 2. ESRD on HD TTS 3. Anemia of chronic disease  Discharge Medications: Allergies as of 10/26/2019      Reactions   Nsaids Anaphylaxis, Other (See Comments)   Due to ESRD   Grapefruit Extract Other (See Comments)   Per pt: can't have because of medications      Medication List    STOP taking these medications   amoxicillin-clavulanate 875-125 MG tablet Commonly known as: AUGMENTIN   CellCept 500 MG tablet Generic drug: mycophenolate Replaced by: mycophenolate 250 MG capsule   hydrALAZINE 25 MG tablet Commonly known as: APRESOLINE     TAKE these medications   amLODipine 10 MG tablet Commonly known as: NORVASC Take 10 mg by mouth daily.   Auryxia 1 GM 210 MG(Fe) tablet Generic drug: ferric citrate Take 420 mg by mouth 3 (three) times daily with meals.   calcitRIOL 0.5 MCG capsule Commonly known as: ROCALTROL Take 1 capsule (0.5 mcg total) by mouth 3 (three) times a week. Start taking on: October 28, 2019 What changed:   how much to take  additional instructions   cinacalcet 30 MG tablet Commonly known as: SENSIPAR Take 1 tablet (30 mg total) by mouth daily with breakfast. What changed: when to take this   Claritin 10 MG tablet Generic drug: loratadine Take 10 mg by mouth daily as needed for allergies.   diphenhydrAMINE 25 mg capsule Commonly known as: BENADRYL Take 25 mg by mouth as needed for allergies or sleep.   Dulcolax 5 MG EC tablet Generic drug: bisacodyl Take 5 mg by mouth at bedtime as needed for mild constipation.   famotidine 20 MG tablet Commonly known as: PEPCID Take 20 mg by mouth at bedtime.   gentamicin cream 0.1 % Commonly known as:  GARAMYCIN Apply 1 application topically daily.   isosorbide-hydrALAZINE 20-37.5 MG tablet Commonly known as: BIDIL Take 2 tablets by mouth 3 (three) times daily.   labetalol 200 MG tablet Commonly known as: NORMODYNE Take 1 tablet (200 mg total) by mouth 3 (three) times daily.   lactulose 10 GM/15ML solution Commonly known as: CHRONULAC Take 30 mLs by mouth as needed for mild constipation.   losartan 50 MG tablet Commonly known as: COZAAR Take 1 tablet (50 mg total) by mouth daily. What changed:   medication strength  how much to take  when to take this   MiraLax 17 GM/SCOOP powder Generic drug: polyethylene glycol powder Take 1 Container by mouth as needed for mild constipation.   multivitamin Tabs tablet Take 1 tablet by mouth at bedtime. What changed: medication strength   mycophenolate 250 MG capsule Commonly known as: CELLCEPT Take 2 capsules (500 mg total) by mouth 2 (two) times daily. Replaces: CellCept 500 MG tablet   Nac 600 600 MG Caps Generic drug: Acetylcysteine Take 600 mg by mouth daily.   ramelteon 8 MG tablet Commonly known as: ROZEREM Take 1 tablet (8 mg total) by mouth at bedtime.   tacrolimus 1 MG capsule Commonly known as: PROGRAF Take 2 mg by mouth 2 (two) times daily.       Disposition and follow-up:   [name] was discharged from [facility] in Stable condition.  At the hospital follow up visit please address:  1.  Bilateral vision loss 2/2 Hypertensive urgency: Patient presented with blurry vision and decreased color perception noted to have papilledema at ophthalmologist's office. LP and opening pressure within normal limits and MRI brain and orbits without acute findings. Patient treated for hypertensive emergency and optic neuritis with aggressive BP control and steroids.  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 However, had not been getting dialysis nightly due to vision loss. She was transitioned to HD during admission and set up with outpatient dialysis center on discharge. She has a history of secondary hyperparathyroidism for which she needs to follow up with endocrinologist. - Outpatient HD on TTS schedule per nephrology - Follow up with endocrinology  Anemia of chronic disease: Patient required 1U pRBC during admisison with appropriate response. Patient to get iron and aranesp per nephrology.    2.  Labs / imaging needed at time of follow-up: BMP  3.  Pending labs/ test needing follow-up: None  Follow-up Appointments: Follow-up Information    Miquel Dunn, NP Follow up on 11/07/2019.   Specialty: Cardiology Why: 01:30 PM appointment Contact information: [place] [address] [place]. Call.   Why: Call Monday to schedule an appointment to establish a primary care provider.  Contact information: [place] [phone] [place]. Schedule an appointment as soon as possible for a visit in 2 week(s).   Contact information: [address] [place] [address] [phone]        Luberta Mutter, MD. Schedule an appointment as soon as possible for a visit in 2 day(s).   Specialty: Ophthalmology Contact information: [place] [address] [phone]           Hospital Course by problem list: 1. Bilateral vision loss 2/2 hypertensive urgency vs optic neuritis Patient presented with blurry vision, decreased color perception and floaters in bilateral vision fields and noted to have papilledema  at ophthalmologist's office. LP and opening pressure wnl. MRI brain and orbits w/o acute findings. Patient treated for hypertensive emergency and optic neuritis with aggressive BP control and steroids. She received serial HD's for volume removal with improvement in her symptoms. She has completed her steroid course and BP improved with serial HDs, labetalol and BiDil. She notes her symptoms are greatly improved. Patient discharged with amlodipine, labetalol, BiDil and losartan. She is to follow up with neuro-ophthalmology and cardiology.  2. 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 She had significant anion gap acidosis and elevated BUN/Cr on admission due to not being able to dialyze for several nights due to her vision loss. She was transitioned to hemodialysis during this admission and received serial HD's which she tolerated well. She was set up with outpatient dialysis center in [place] on discharge. She also has a history of hyperparathyrodism and notes that she was supposed to get her parathyroid removed. She is to follow up with endocrinology on discharge.   3. Anemia of chronic disease:  Patient with anemia of chronic disease in setting of ESRD.  She required 1U pRBC during admission for Hb<7 with appropriate response. Patient's hemoglobin stable on discharge. She is to continue with iron and aranesp dosing per nephrology with HD.    Discharge Vitals:   BP (!) 167/106 (BP Location: Left Arm)   Pulse 81   Temp 98.2 F (36.8 C) (Oral)   Resp 20   Ht 5' (1.524 m)   Wt 58.6 kg   LMP 10/06/2019   SpO2 100%   BMI 25.23 kg/m   Pertinent Labs, Studies, and Procedures:  CBC Latest Ref Rng & Units 10/26/2019 10/25/2019 10/24/2019  WBC 4.0 - 10.5 K/uL 9.3 9.3 7.7  Hemoglobin 12.0 - 15.0 g/dL 7.6(L) 7.2(L) 8.8(L)  Hematocrit 36.0 - 46.0 % 24.2(L) 22.9(L) 26.6(L)  Platelets 150 - 400 K/uL 218 169 155    CMP Latest Ref Rng & Units 10/26/2019 10/25/2019 10/24/2019  Glucose 70 - 99 mg/dL 201(H) 197(H) 201(H)  BUN 6 - 20 mg/dL 64(H) 41(H) 42(H)  Creatinine 0.44 - 1.00 mg/dL 9.41(H) 7.37(H) 10.30(H)  Sodium 135 - 145 mmol/L 135 135 133(L)  Potassium 3.5 - 5.1 mmol/L 3.9 3.7 4.1  Chloride 98 - 111 mmol/L 97(L) 98 93(L)  CO2 22 - 32 mmol/L 20(L) 23 21(L)  Calcium 8.9 - 10.3 mg/dL 7.7(L) 8.0(L) 8.7(L)  Total Protein 6.5 - 8.1 g/dL - - -  Total Bilirubin 0.3 - 1.2 mg/dL - - -  Alkaline Phos 38 - 126 U/L - - -  AST 15 - 41 U/L - - -  ALT 0 - 44 U/L - - -     Discharge Instructions: Discharge Instructions    Call MD for:  difficulty breathing, headache or visual disturbances   Complete by: As directed    Call MD for:  extreme fatigue   Complete by: As directed    Call MD for:  hives   Complete by: As directed    Call MD for:  persistant dizziness or light-headedness   Complete by: As directed    Call MD for:  persistant nausea and vomiting   Complete by: As directed    Call MD for:  severe uncontrolled pain   Complete by: As directed    Call MD for:  temperature >100.4   Complete by: As directed    Diet - low sodium heart healthy   Complete by: As directed    Increase activity slowly   Complete by: As directed     [name],  You were admitted for vision changes in setting of uncontrolled hypertension. You were treated with aggressive blood pressure control and also with steroids for concerns of optic neuritis. Your symptoms improved. Please schedule a visit with the neuro-ophthalmologist at [facility] at your earliest convenience.   You have been set up at the [facility] dialysis center for hemodialysis on Tuesday, Thursday and Saturdays around 7am. Please go to dialysis on Tuesday at your scheduled time. You will be contacted with further details. On discharge, please continue to take your amlodipine 10mg  daily, labetalol 200mg  three times daily, and Bidil  (isosorbide-hydralazine) 2 tablets three times daily. Please schedule a follow up appointment with your PCP and endocrinologist.  You have an appointment at [facility] for hospital follow up on 3/4. If you continue to have uncontrolled hypertension, you are recommended to continue following with the cardiologist.    Thank you!  Signed: [name], MD  Internal Medicine, PGY-1 Pager: [phone] 10/26/2019, 3:19 PM

## 2019-10-26 NOTE — Progress Notes (Signed)
Pt off the unit for dialysis from 7:30am-12pm. VSS upon return. Administered daily medications as ordered.

## 2019-10-26 NOTE — Progress Notes (Signed)
1115 HD treatment ended, Patient complained of chest discomfort, VSS 146/86, sats 99 percent on RA, HRR 89.  Dr Hollie Salk at bedside with patient. BP recheck 150/95, sats 100 room air. Dr Hollie Salk instructed patient to take BP meds on return to room. Primary RN Wilber Bihari informed. Patient has no further complaints

## 2019-10-26 NOTE — Progress Notes (Signed)
IV removed without complication. AVS and discharge instructions reviewed with patient and fiancee at bedside. Pt discharged from facility via wheelchair with all belongings.

## 2019-10-26 NOTE — Progress Notes (Signed)
Subjective: HD#5 Overnight, no acute events reported.   This morning, patient evaluated at bedside during HD session. She notes that her vision is much improved and is almost back to normal. We discussed discharge planning and patient agreeable to follow up with HD on Tuesday as outpatient and neuro-ophthalmologist. She does note that she has migraines after HD and is requesting medication to take as needed. However, we discussed that patient to be changed over to hypoallergenic dialyzer which may help with her symptoms. She expresses understanding.  Objective:  Vital signs in last 24 hours: Vitals:   10/25/19 1731 10/25/19 2028 10/26/19 0011 10/26/19 0416  BP: 123/82 128/89 126/82 122/73  Pulse: 75 77 82 82  Resp: 18  17 17   Temp: 98.3 F (36.8 C) 97.6 F (36.4 C) 97.9 F (36.6 C) (!) 97.5 F (36.4 C)  TempSrc: Oral Oral Oral Oral  SpO2: 100% 100% 100% 99%  Weight:      Height:       CBC Latest Ref Rng & Units 10/26/2019 10/25/2019 10/24/2019  WBC 4.0 - 10.5 K/uL 9.3 9.3 7.7  Hemoglobin 12.0 - 15.0 g/dL 7.6(L) 7.2(L) 8.8(L)  Hematocrit 36.0 - 46.0 % 24.2(L) 22.9(L) 26.6(L)  Platelets 150 - 400 K/uL 218 169 155   CMP Latest Ref Rng & Units 10/26/2019 10/25/2019 10/24/2019  Glucose 70 - 99 mg/dL 201(H) 197(H) 201(H)  BUN 6 - 20 mg/dL 64(H) 41(H) 42(H)  Creatinine 0.44 - 1.00 mg/dL 9.41(H) 7.37(H) 10.30(H)  Sodium 135 - 145 mmol/L 135 135 133(L)  Potassium 3.5 - 5.1 mmol/L 3.9 3.7 4.1  Chloride 98 - 111 mmol/L 97(L) 98 93(L)  CO2 22 - 32 mmol/L 20(L) 23 21(L)  Calcium 8.9 - 10.3 mg/dL 7.7(L) 8.0(L) 8.7(L)  Total Protein 6.5 - 8.1 g/dL - - -  Total Bilirubin 0.3 - 1.2 mg/dL - - -  Alkaline Phos 38 - 126 U/L - - -  AST 15 - 41 U/L - - -  ALT 0 - 44 U/L - - -   Physical Exam  Constitutional: She is oriented to person, place, and time and well-developed, well-nourished, and in no distress.  HENT:  Head: Normocephalic and atraumatic.  Eyes: Conjunctivae and EOM are normal.    Cardiovascular: Regular rhythm, normal heart sounds and intact distal pulses. Tachycardia present. Exam reveals no gallop and no friction rub.  No murmur heard. Pulmonary/Chest: Effort normal and breath sounds normal. No respiratory distress. She has no wheezes. She has no rales.  Abdominal: Soft. Bowel sounds are normal. She exhibits no distension. There is no abdominal tenderness. There is no rebound.  Musculoskeletal:        General: Normal range of motion.     Comments: Tenderness to palpation of R flank, worse with deep inspiration  Neurological: She is alert and oriented to person, place, and time.  Vision improved today to 20/25 with glasses on snellen chart (20/50 yesterday)   Skin: Skin is warm and dry. No rash noted. No erythema.    Assessment/Plan: Tara Mills is a 24year old female with history of ESRD on PD and anemia of chronic disease presenting with bilateral vision loss, noted to have papilledema on ophthalmologic exam.   Bilateral vision loss:  Hypertensive emergency: Optic neuritis: Patient presented with blurry vision, decreased color perception and floaters in bilateral vision fields and noted to have papilledema at ophthalmologist's office. LP and opening pressure wnl. MRI brain and orbits w/o acute findings. Patient treated for hypertensive emergency and  optic neuritis with aggressive BP control and steroids. She has completed her steroid course and BP improved with labetalol and BiDil. She notes her symptoms are greatly improved.  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  Patient has been receiving serial HD's during admission and tolerating well. She has been set up with outpatient dialysis center on discharge. She has history of secondary hyperparathyroidism and notes that she was supposed to have her parathyroid removed. Patient to follow up with endocrinology.   - HD per nephrology  - Calcitriol 1.6mcg MWF - Cinacalcet 30mg  daily - Tacrolimus 2mg  bid - Follow up with endocrinologist    Anemia of chronic disease::  Hb stable this morning.  - Iron and aranesp per nephrology  - CBC daily  FEN/GI:  Diet: Renal Fluids: None Electrolytes: Monitor and replete prn  VTE Prophylaxis: SCDs, Heparin during dialysis  Code status: FULL  Prior to Admission Living Arrangement: Home Anticipated Discharge Location: Home Barriers to Discharge: None  [name], MD  Internal Medicine, PGY-1 Pager: [phone] 10/26/2019, 6:22 AM

## 2019-10-28 ENCOUNTER — Telehealth: Payer: Self-pay | Admitting: *Deleted

## 2019-10-28 ENCOUNTER — Telehealth (HOSPITAL_COMMUNITY): Payer: Self-pay | Admitting: Nephrology

## 2019-10-28 NOTE — Telephone Encounter (Signed)
Transition of care contact from inpatient facility  Date of discharge: 10/26/19  Date of contact: 10/28/19 Method: Phone Spoke to: Tara Mills  Tara Mills contacted to discuss transition of care from recent inpatient hospitalization. Originally left message, but Tara Mills later returned the call. Tara Mills was admitted to [facility] from 10/21/19 to 10/26/19 with discharge diagnosis of hypertensive urgency with papilledema and vision loss.  Medication changes were reviewed. She was able to pick up her new medications with the exception of her ramelteon which is waiting on MD approval.  She is good with transportation to HD tomorrow - knows where to go and time.  Has already called and arranged f/u with ophthalmology for tomorrow as well.  Other f/u needs include: none at this time.  Veneta Penton, PA-C [employer] Pager [phone]

## 2019-10-28 NOTE — Telephone Encounter (Signed)
PA for Ramelteon denied not a preferred medication for patient's insurance.  Patient will need to try and fail the preferred medications of Zolpedem, Temazepam or Flurazepam.  Message to be sent to Dr. Marva Panda to consider a change.   Sander Nephew, RN 10/28/2019 11:53 AM.

## 2019-10-28 NOTE — Telephone Encounter (Signed)
.  Transition of care contact from inpatient facility  Date of Discharge: 10/26/19  Date of Contact: 10/28/19 - attempted Method of contact: Phone  Attempted to contact patient to discuss transition of care from inpatient admission. Patient did not answer the phone. Message was left on the patient's voicemail with call back number [phone].  Veneta Penton, PA-C [employer] Pager [phone]

## 2019-11-07 ENCOUNTER — Other Ambulatory Visit: Payer: Self-pay

## 2019-11-07 ENCOUNTER — Encounter: Payer: Self-pay | Admitting: Cardiology

## 2019-11-07 ENCOUNTER — Ambulatory Visit: Payer: Medicaid Other | Admitting: Cardiology

## 2019-11-07 VITALS — BP 147/97 | HR 87 | Temp 97.7°F | Ht 60.0 in | Wt 137.1 lb

## 2019-11-07 DIAGNOSIS — N186 End stage renal disease: Secondary | ICD-10-CM

## 2019-11-07 DIAGNOSIS — I119 Hypertensive heart disease without heart failure: Secondary | ICD-10-CM

## 2019-11-07 DIAGNOSIS — N2581 Secondary hyperparathyroidism of renal origin: Secondary | ICD-10-CM

## 2019-11-07 DIAGNOSIS — I160 Hypertensive urgency: Secondary | ICD-10-CM

## 2019-11-07 DIAGNOSIS — Z992 Dependence on renal dialysis: Secondary | ICD-10-CM

## 2019-11-07 MED ORDER — AMLODIPINE BESYLATE 10 MG PO TABS: 10.0000 mg | ORAL_TABLET | Freq: Every day | ORAL | 1 refills | Status: DC

## 2019-11-07 NOTE — Progress Notes (Signed)
Primary Physician/Referring:  Patient, No Pcp Per  Patient ID: Tara Mills, female    DOB: 31-Mar-1996, 24 y.o.   MRN: [id number]  Chief Complaint  Patient presents with  . Hypertension  . Hospitalization Follow-up   HPI:    Tara Mills  is a 24 y.o. Caucasian female patien with History of CKD and is being evaluated for repeat Transplant, follows [facility] team. Currently on peritoneal Dialysis. Was diagnosed with one kidney at age 17. At 24 years of age had transplant in [place] but has had graft failure. Moved to [facility] in May 2020. Started peritoneal dialysis in Dec 2020 in [place]. Was having HD prior to this.  Told to have severe secondary hyperparathyroidism.  Recently admitted on 10/21/2019 for hypertensive urgency with papilledema and visual disturbance. Treated for optic neuritis; however, felt to be unlikely in view of absent MRI findings. Started on Bidil and Labetalol and now presents for follow up.  She is overall doing well; however, has noticed over the last few days that her visual changes have again started to reoccur. She has been evaluated by her opthamologist, who is closely monitoring.   Blood pressure has again started to elevate at home with systolic readings in Q000111Q, but does have some readings in the 130's. She is found to not be on amlodipine, she states that she was told to stop this at discharge.  Past Medical History:  Diagnosis Date  . Anemia   . Hypertension   . Renal disorder    Past Surgical History:  Procedure Laterality Date  . NEPHRECTOMY TRANSPLANTED ORGAN     2007   Social History   Tobacco Use  . Smoking status: Never Smoker  . Smokeless tobacco: Never Used  Substance Use Topics  . Alcohol use: Not Currently    ROS  Review of Systems  Eyes: Positive for visual disturbance.  Cardiovascular: Negative for chest pain, claudication, dyspnea on exertion, leg swelling, orthopnea, palpitations and syncope.  Neurological: Negative  for dizziness, focal weakness and headaches.  All other systems reviewed and are negative.  Objective  Blood pressure (!) 147/97, pulse 87, temperature 97.7 F (36.5 C), height 5' (1.524 m), weight 137 lb 1.6 oz (62.2 kg), SpO2 98 %.  Vitals with BMI 11/07/2019 10/26/2019 10/26/2019  Height 5\' 0"  - -  Weight 137 lbs 2 oz - -  BMI 123456 - -  Systolic Q000111Q [id number]  Diastolic 97 A999333 86  Pulse 87 81 89    Physical Exam  Constitutional: She is oriented to person, place, and time. Vital signs are normal. She appears well-developed and well-nourished.  Cardiovascular: Normal rate, regular rhythm, normal heart sounds and intact distal pulses.  Pulmonary/Chest: Effort normal and breath sounds normal. No accessory muscle usage. No respiratory distress.  Neurological: She is alert and oriented to person, place, and time.  Vitals reviewed.  Laboratory examination:   Recent Labs    10/24/19 0051 10/25/19 0443 10/26/19 0359  NA 133* 135 135  K 4.1 3.7 3.9  CL 93* 98 97*  CO2 21* 23 20*  GLUCOSE 201* 197* 201*  BUN 42* 41* 64*  CREATININE 10.30* 7.37* 9.41*  CALCIUM 8.7* 8.0* 7.7*  GFRNONAA 5* 7* 5*  GFRAA 6* 8* 6*   estimated creatinine clearance is 7.7 mL/min (A) (by C-G formula based on SCr of 9.41 mg/dL (H)).  CMP Latest Ref Rng & Units 10/26/2019 10/25/2019 10/24/2019  Glucose 70 - 99 mg/dL 201(H) 197(H) 201(H)  BUN 6 - 20  mg/dL 64(H) 41(H) 42(H)  Creatinine 0.44 - 1.00 mg/dL 9.41(H) 7.37(H) 10.30(H)  Sodium 135 - 145 mmol/L 135 135 133(L)  Potassium 3.5 - 5.1 mmol/L 3.9 3.7 4.1  Chloride 98 - 111 mmol/L 97(L) 98 93(L)  CO2 22 - 32 mmol/L 20(L) 23 21(L)  Calcium 8.9 - 10.3 mg/dL 7.7(L) 8.0(L) 8.7(L)  Total Protein 6.5 - 8.1 g/dL - - -  Total Bilirubin 0.3 - 1.2 mg/dL - - -  Alkaline Phos 38 - 126 U/L - - -  AST 15 - 41 U/L - - -  ALT 0 - 44 U/L - - -   CBC Latest Ref Rng & Units 10/26/2019 10/25/2019 10/24/2019  WBC 4.0 - 10.5 K/uL 9.3 9.3 7.7  Hemoglobin 12.0 - 15.0 g/dL 7.6(L)  7.2(L) 8.8(L)  Hematocrit 36.0 - 46.0 % 24.2(L) 22.9(L) 26.6(L)  Platelets 150 - 400 K/uL 218 169 155   Lipid Panel  No results found for: CHOL, TRIG, HDL, CHOLHDL, VLDL, LDLCALC, LDLDIRECT HEMOGLOBIN A1C Lab Results  Component Value Date   HGBA1C 4.4 (L) 10/23/2019   MPG 79.58 10/23/2019   TSH No results for input(s): TSH in the last 8760 hours.  External labs :  A1C 4.400 10/23/2019 Glucose Random 201.000 10/26/2019 MicroAlbumin Urine N/D MicroAlbumin/Creat N/D BUN 64.000 10/26/2019 Creatinine, Serum 9.410 10/26/2019  Medications and allergies   Allergies  Allergen Reactions  . Nsaids Anaphylaxis and Other (See Comments)    Due to ESRD   . Grapefruit Extract Other (See Comments)    Per pt: can't have because of medications  . Other     Other reaction(s): Unknown     Current Outpatient Medications  Medication Instructions  . amLODipine (NORVASC) 10 mg, Oral, Daily  . calcitRIOL (ROCALTROL) 0.5 mcg, Oral, 3 times weekly  . cinacalcet (SENSIPAR) 30 mg, Oral, Daily with breakfast  . Claritin 10 mg, Oral, Daily PRN  . diphenhydrAMINE (BENADRYL) 25 mg, Oral, As needed  . Dulcolax 5 mg, Oral, At bedtime PRN  . famotidine (PEPCID) 20 mg, Oral, Daily at bedtime  . ferric citrate (AURYXIA) 420 mg, Oral, 3 times daily with meals  . gentamicin cream (GARAMYCIN) 0.1 % 1 application, Topical, Daily  . isosorbide-hydrALAZINE (BIDIL) 20-37.5 MG tablet 2 tablets, Oral, 3 times daily  . labetalol (NORMODYNE) 200 mg, Oral, 3 times daily  . lactulose (CHRONULAC) 10 GM/15ML solution 30 mLs, Oral, As needed  . losartan (COZAAR) 50 mg, Oral, Daily  . MIRALAX 17 GM/SCOOP powder 1 Container, Oral, As needed  . multivitamin (RENA-VIT) TABS tablet 1 tablet, Oral, Daily at bedtime  . mycophenolate (CELLCEPT) 500 mg, Oral, 2 times daily  . Nac 600 600 mg, Oral, Daily PRN  . ramelteon (ROZEREM) 8 mg, Oral, Daily at bedtime  . tacrolimus (PROGRAF) 2 mg, Oral, 2 times daily, 3 tabs in the AM  and    Radiology:  No results found.  Cardiac Studies:   Echocardiogram 10/25/2019:  1. Left ventricular ejection fraction, by estimation, is 60 to 65%. The  left ventricle has normal function. The left ventricle has no regional  wall motion abnormalities. Left ventricular diastolic parameters are  consistent with Grade II diastolic  dysfunction (pseudonormalization).  2. Right ventricular systolic function is normal. The right ventricular  size is normal. There is mildly elevated pulmonary artery systolic  pressure.  3. Peak TR gradient 29 mm Hg, PASP estimated at 32 mm Hg consistent with  mild pulmonary hypertension. . Tricuspid valve regurgitation is mild to  moderate.  4. The inferior vena cava is normal in size with greater than 50%  respiratory variability, suggesting right atrial pressure of 3 mmHg.  5. A small pericardial effusion is present. The pericardial effusion is  circumferential. The pericardial effusion appears to contain Clear fluid.  There is no evidence of cardiac tamponade.   Assessment     ICD-10-CM   1. Hypertensive urgency  I16.0 EKG 12-Lead  2. Hypertensive heart disease with chronic diastolic congestive heart failure (HCC)  I11.0    I50.32   3. ESRD (end stage renal disease) on dialysis ([place])  N18.6    Z99.2   4. Secondary hyperparathyroidism ([place])  N25.81     EKG 10/21/2019: Sinus tachycardia at the rate of 125 bpm, leftward axis, incomplete right bundle branch block. Nonspecific T abnormality. Normal QT interval.  EKG 11/07/2019: Normal sinus rhythm at 89 bpm, normal axis, nonspecific T wave abnormality.   Meds ordered this encounter  Medications  . amLODipine (NORVASC) 10 MG tablet    Sig: Take 1 tablet (10 mg total) by mouth daily.    Dispense:  30 tablet    Refill:  1    Order Specific Question:   Supervising Provider    Answer:   Adrian Prows [[id number]]    Medications Discontinued During This Encounter  Medication Reason  .  amLODipine (NORVASC) 10 MG tablet Error     Recommendations:   Tara Mills  is a 24 y.o. Caucasian female patien with History of CKD and is being evaluated for repeat Transplant, follows [facility] team. Currently on peritoneal Dialysis. Was diagnosed with one kidney at age 58. At 24 years of age had transplant in [place] but has had graft failure. Moved to [facility] in May 2020. Started peritoneal dialysis in Dec 2020 in [place]. Was having HD prior to this.  Told to have severe secondary hyperparathyroidism.  Recently admitted on 10/21/2019 for hypertensive urgency with papilledema and visual disturbance. Treated for optic neuritis; however, felt to be unlikely in view of absent MRI findings. Started on Bidil and Labetalol and now presents for follow up.  Over the last few days, patient has noticed some again visual changes.  She has been evaluated by her ophthalmologist recently who is closely watching.  Her blood pressure has started to increase back up since being discharged.  She is found to not be taking amlodipine, states that she was told to stop this at discharge.  Do not see any documentation for this.  I have refilled her prescription and advised her to take 10 mg daily.  We will continue with losartan 50 mg, BiDil, and labetalol.  Otherwise, she is feeling well.  No significant dyspnea on exertion or leg edema.  She will continue with dialysis per nephrology.  In regard to upcoming parathyroidectomy, if blood pressure is well controlled, can certainly proceed with this.  Hopefully this will also help with blood pressure control.  If blood pressure is well controlled at that time, will consider as needed follow-up.  Miquel Dunn, MSN, APRN, FNP-C [facility]. [place] Office: [phone] Fax: [phone]

## 2019-11-14 ENCOUNTER — Observation Stay (HOSPITAL_COMMUNITY): Payer: Medicaid Other

## 2019-11-14 ENCOUNTER — Observation Stay (HOSPITAL_COMMUNITY)
Admission: EM | Admit: 2019-11-14 | Discharge: 2019-11-15 | Disposition: A | Discharge status: Home / Self Care | Payer: Medicaid Other | Attending: Internal Medicine | Admitting: Internal Medicine

## 2019-11-14 ENCOUNTER — Emergency Department (HOSPITAL_COMMUNITY): Payer: Medicaid Other

## 2019-11-14 ENCOUNTER — Encounter (HOSPITAL_COMMUNITY): Payer: Self-pay | Admitting: Emergency Medicine

## 2019-11-14 ENCOUNTER — Other Ambulatory Visit: Payer: Self-pay

## 2019-11-14 DIAGNOSIS — D631 Anemia in chronic kidney disease: Secondary | ICD-10-CM

## 2019-11-14 DIAGNOSIS — Z940 Kidney transplant status: Secondary | ICD-10-CM | POA: Insufficient documentation

## 2019-11-14 DIAGNOSIS — Z9889 Other specified postprocedural states: Secondary | ICD-10-CM

## 2019-11-14 DIAGNOSIS — Z20822 Contact with and (suspected) exposure to covid-19: Secondary | ICD-10-CM | POA: Diagnosis not present

## 2019-11-14 DIAGNOSIS — T8611 Kidney transplant rejection: Secondary | ICD-10-CM

## 2019-11-14 DIAGNOSIS — R0602 Shortness of breath: Secondary | ICD-10-CM

## 2019-11-14 DIAGNOSIS — Z992 Dependence on renal dialysis: Secondary | ICD-10-CM | POA: Diagnosis not present

## 2019-11-14 DIAGNOSIS — R059 Cough, unspecified: Secondary | ICD-10-CM

## 2019-11-14 DIAGNOSIS — Q614 Renal dysplasia: Secondary | ICD-10-CM | POA: Insufficient documentation

## 2019-11-14 DIAGNOSIS — I5033 Acute on chronic diastolic (congestive) heart failure: Secondary | ICD-10-CM

## 2019-11-14 DIAGNOSIS — Z91018 Allergy to other foods: Secondary | ICD-10-CM

## 2019-11-14 DIAGNOSIS — Z886 Allergy status to analgesic agent status: Secondary | ICD-10-CM | POA: Diagnosis not present

## 2019-11-14 DIAGNOSIS — N186 End stage renal disease: Secondary | ICD-10-CM | POA: Insufficient documentation

## 2019-11-14 DIAGNOSIS — I132 Hypertensive heart and chronic kidney disease with heart failure and with stage 5 chronic kidney disease, or end stage renal disease: Secondary | ICD-10-CM | POA: Insufficient documentation

## 2019-11-14 DIAGNOSIS — R109 Unspecified abdominal pain: Secondary | ICD-10-CM

## 2019-11-14 DIAGNOSIS — J9601 Acute respiratory failure with hypoxia: Secondary | ICD-10-CM | POA: Diagnosis not present

## 2019-11-14 DIAGNOSIS — Z905 Acquired absence of kidney: Secondary | ICD-10-CM | POA: Diagnosis not present

## 2019-11-14 DIAGNOSIS — N2581 Secondary hyperparathyroidism of renal origin: Secondary | ICD-10-CM | POA: Diagnosis not present

## 2019-11-14 DIAGNOSIS — Z79899 Other long term (current) drug therapy: Secondary | ICD-10-CM | POA: Diagnosis not present

## 2019-11-14 DIAGNOSIS — R05 Cough: Secondary | ICD-10-CM

## 2019-11-14 DIAGNOSIS — R891 Abnormal level of hormones in specimens from other organs, systems and tissues: Secondary | ICD-10-CM

## 2019-11-14 DIAGNOSIS — D649 Anemia, unspecified: Secondary | ICD-10-CM

## 2019-11-14 LAB — CBC WITH DIFFERENTIAL/PLATELET
Abs Immature Granulocytes: 0.03 10*3/uL (ref 0.00–0.07)
Basophils Absolute: 0 10*3/uL (ref 0.0–0.1)
Basophils Relative: 1 %
Eosinophils Absolute: 0.1 10*3/uL (ref 0.0–0.5)
Eosinophils Relative: 2 %
HCT: 18.7 % — ABNORMAL LOW (ref 36.0–46.0)
Hemoglobin: 5.9 g/dL — CL (ref 12.0–15.0)
Immature Granulocytes: 1 %
Lymphocytes Relative: 10 %
Lymphs Abs: 0.6 10*3/uL — ABNORMAL LOW (ref 0.7–4.0)
MCH: 33.1 pg (ref 26.0–34.0)
MCHC: 31.6 g/dL (ref 30.0–36.0)
MCV: 105.1 fL — ABNORMAL HIGH (ref 80.0–100.0)
Monocytes Absolute: 0.3 10*3/uL (ref 0.1–1.0)
Monocytes Relative: 4 %
Neutro Abs: 5.2 10*3/uL (ref 1.7–7.7)
Neutrophils Relative %: 82 %
Platelets: 172 10*3/uL (ref 150–400)
RBC: 1.78 MIL/uL — ABNORMAL LOW (ref 3.87–5.11)
RDW: 15.6 % — ABNORMAL HIGH (ref 11.5–15.5)
WBC: 6.3 10*3/uL (ref 4.0–10.5)
nRBC: 0 % (ref 0.0–0.2)

## 2019-11-14 LAB — URINALYSIS, ROUTINE W REFLEX MICROSCOPIC
Bilirubin Urine: NEGATIVE
Glucose, UA: 150 mg/dL — AB
Hgb urine dipstick: NEGATIVE
Ketones, ur: NEGATIVE mg/dL
Leukocytes,Ua: NEGATIVE
Nitrite: NEGATIVE
Protein, ur: >=300 mg/dL — AB
Specific Gravity, Urine: 1.01 (ref 1.005–1.030)
pH: 9 — ABNORMAL HIGH (ref 5.0–8.0)

## 2019-11-14 LAB — POC SARS CORONAVIRUS 2 AG -  ED: SARS Coronavirus 2 Ag: NEGATIVE

## 2019-11-14 LAB — BASIC METABOLIC PANEL
Anion gap: 21 — ABNORMAL HIGH (ref 5–15)
BUN: 68 mg/dL — ABNORMAL HIGH (ref 6–20)
CO2: 19 mmol/L — ABNORMAL LOW (ref 22–32)
Calcium: 8 mg/dL — ABNORMAL LOW (ref 8.9–10.3)
Chloride: 100 mmol/L (ref 98–111)
Creatinine, Ser: 19.33 mg/dL — ABNORMAL HIGH (ref 0.44–1.00)
GFR calc Af Amer: 3 mL/min — ABNORMAL LOW (ref >60)
GFR calc non Af Amer: 2 mL/min — ABNORMAL LOW (ref >60)
Glucose, Bld: 111 mg/dL — ABNORMAL HIGH (ref 70–99)
Potassium: 4.2 mmol/L (ref 3.5–5.1)
Sodium: 140 mmol/L (ref 135–145)

## 2019-11-14 LAB — HEMOGLOBIN AND HEMATOCRIT, BLOOD
HCT: 25.3 % — ABNORMAL LOW (ref 36.0–46.0)
Hemoglobin: 8.4 g/dL — ABNORMAL LOW (ref 12.0–15.0)

## 2019-11-14 LAB — I-STAT BETA HCG BLOOD, ED (MC, WL, AP ONLY): I-stat hCG, quantitative: 8.5 m[IU]/mL — ABNORMAL HIGH (ref <5)

## 2019-11-14 LAB — PREGNANCY, URINE: Preg Test, Ur: NEGATIVE

## 2019-11-14 LAB — POC OCCULT BLOOD, ED: Fecal Occult Bld: NEGATIVE

## 2019-11-14 LAB — HCG, QUANTITATIVE, PREGNANCY: hCG, Beta Chain, Quant, S: 2 m[IU]/mL (ref <5)

## 2019-11-14 MED ORDER — ONDANSETRON HCL 4 MG PO TABS
4.0000 mg | ORAL_TABLET | Freq: Four times a day (QID) | ORAL | Status: DC | PRN
  Administered 2019-11-15: 4 mg via ORAL
  Filled 2019-11-14 (×2): qty 1

## 2019-11-14 MED ORDER — HEPARIN SODIUM (PORCINE) 5000 UNIT/ML IJ SOLN
5000.0000 [IU] | Freq: Three times a day (TID) | INTRAMUSCULAR | Status: DC
  Filled 2019-11-14 (×4): qty 1

## 2019-11-14 MED ORDER — CALCITRIOL 0.5 MCG PO CAPS
0.7500 ug | ORAL_CAPSULE | ORAL | Status: DC
  Administered 2019-11-15: 0.75 ug via ORAL
  Filled 2019-11-14: qty 1

## 2019-11-14 MED ORDER — SUCROFERRIC OXYHYDROXIDE 500 MG PO CHEW
1000.0000 mg | CHEWABLE_TABLET | Freq: Three times a day (TID) | ORAL | Status: DC
  Administered 2019-11-14 – 2019-11-15 (×3): 1000 mg via ORAL
  Filled 2019-11-14 (×6): qty 2

## 2019-11-14 MED ORDER — GENTAMICIN SULFATE 0.1 % EX CREA: 1.0000 "application " | TOPICAL_CREAM | Freq: Every day | CUTANEOUS | Status: DC

## 2019-11-14 MED ORDER — TACROLIMUS 1 MG PO CAPS
2.0000 mg | ORAL_CAPSULE | Freq: Two times a day (BID) | ORAL | Status: DC
  Administered 2019-11-14: 2 mg via ORAL
  Filled 2019-11-14: qty 2

## 2019-11-14 MED ORDER — HEPARIN 1000 UNIT/ML FOR PERITONEAL DIALYSIS: 500.0000 [IU] | INTRAMUSCULAR | Status: DC | PRN

## 2019-11-14 MED ORDER — FAMOTIDINE 20 MG PO TABS
20.0000 mg | ORAL_TABLET | Freq: Every day | ORAL | Status: DC
  Administered 2019-11-14: 20 mg via ORAL
  Filled 2019-11-14: qty 1

## 2019-11-14 MED ORDER — HEPARIN 1000 UNIT/ML FOR PERITONEAL DIALYSIS
INTRAPERITONEAL | Status: DC | PRN
  Filled 2019-11-14: qty 5000

## 2019-11-14 MED ORDER — RAMELTEON 8 MG PO TABS
8.0000 mg | ORAL_TABLET | Freq: Every day | ORAL | Status: DC
  Administered 2019-11-14: 8 mg via ORAL
  Filled 2019-11-14 (×2): qty 1

## 2019-11-14 MED ORDER — LOSARTAN POTASSIUM 50 MG PO TABS
50.0000 mg | ORAL_TABLET | Freq: Every day | ORAL | Status: DC
  Administered 2019-11-14 – 2019-11-15 (×2): 50 mg via ORAL
  Filled 2019-11-14 (×2): qty 1

## 2019-11-14 MED ORDER — RENA-VITE PO TABS
1.0000 | ORAL_TABLET | Freq: Every day | ORAL | Status: DC
  Administered 2019-11-14: 1 via ORAL
  Filled 2019-11-14: qty 1

## 2019-11-14 MED ORDER — CINACALCET HCL 30 MG PO TABS
30.0000 mg | ORAL_TABLET | Freq: Every day | ORAL | Status: DC
  Administered 2019-11-15: 30 mg via ORAL
  Filled 2019-11-14 (×3): qty 1

## 2019-11-14 MED ORDER — GENTAMICIN SULFATE 0.1 % EX CREA
1.0000 "application " | TOPICAL_CREAM | Freq: Every day | CUTANEOUS | Status: DC
  Administered 2019-11-14: 1 via TOPICAL
  Filled 2019-11-14: qty 15

## 2019-11-14 MED ORDER — SODIUM CHLORIDE 0.9 % IV SOLN: 10.0000 mL/h | Freq: Once | INTRAVENOUS | Status: DC

## 2019-11-14 MED ORDER — DELFLEX-LC/4.25% DEXTROSE 483 MOSM/L IP SOLN: INTRAPERITONEAL | Status: DC

## 2019-11-14 MED ORDER — FERRIC CITRATE 1 GM 210 MG(FE) PO TABS
420.0000 mg | ORAL_TABLET | Freq: Three times a day (TID) | ORAL | Status: DC
  Administered 2019-11-14: 420 mg via ORAL
  Filled 2019-11-14 (×2): qty 2

## 2019-11-14 MED ORDER — ACETAMINOPHEN 325 MG PO TABS
650.0000 mg | ORAL_TABLET | Freq: Four times a day (QID) | ORAL | Status: DC | PRN
  Administered 2019-11-14 – 2019-11-15 (×4): 650 mg via ORAL
  Filled 2019-11-14 (×4): qty 2

## 2019-11-14 MED ORDER — ISOSORB DINITRATE-HYDRALAZINE 20-37.5 MG PO TABS
2.0000 | ORAL_TABLET | Freq: Three times a day (TID) | ORAL | Status: DC
  Administered 2019-11-14 – 2019-11-15 (×5): 2 via ORAL
  Filled 2019-11-14 (×5): qty 2

## 2019-11-14 MED ORDER — AMLODIPINE BESYLATE 10 MG PO TABS
10.0000 mg | ORAL_TABLET | Freq: Every day | ORAL | Status: DC
  Administered 2019-11-14 – 2019-11-15 (×2): 10 mg via ORAL
  Filled 2019-11-14: qty 1
  Filled 2019-11-14: qty 2

## 2019-11-14 MED ORDER — CALCITRIOL 0.5 MCG PO CAPS: 0.5000 ug | ORAL_CAPSULE | ORAL | Status: DC

## 2019-11-14 MED ORDER — TACROLIMUS 1 MG PO CAPS: 3.0000 mg | ORAL_CAPSULE | Freq: Two times a day (BID) | ORAL | Status: DC

## 2019-11-14 MED ORDER — DELFLEX-LC/2.5% DEXTROSE 394 MOSM/L IP SOLN: INTRAPERITONEAL | Status: DC

## 2019-11-14 MED ORDER — ACETAMINOPHEN 650 MG RE SUPP: 650.0000 mg | Freq: Four times a day (QID) | RECTAL | Status: DC | PRN

## 2019-11-14 MED ORDER — TORSEMIDE 20 MG PO TABS
100.0000 mg | ORAL_TABLET | Freq: Every day | ORAL | Status: DC
  Administered 2019-11-14 – 2019-11-15 (×2): 100 mg via ORAL
  Filled 2019-11-14 (×2): qty 5

## 2019-11-14 MED ORDER — MYCOPHENOLATE MOFETIL 250 MG PO CAPS
500.0000 mg | ORAL_CAPSULE | Freq: Two times a day (BID) | ORAL | Status: DC
  Administered 2019-11-14 – 2019-11-15 (×3): 500 mg via ORAL
  Filled 2019-11-14 (×4): qty 2

## 2019-11-14 MED ORDER — GUAIFENESIN-CODEINE 100-10 MG/5ML PO SOLN
5.0000 mL | Freq: Four times a day (QID) | ORAL | Status: DC | PRN
  Administered 2019-11-14: 5 mL via ORAL
  Filled 2019-11-14: qty 5

## 2019-11-14 MED ORDER — TACROLIMUS 1 MG PO CAPS
2.0000 mg | ORAL_CAPSULE | Freq: Two times a day (BID) | ORAL | Status: DC
  Administered 2019-11-14 – 2019-11-15 (×2): 2 mg via ORAL
  Filled 2019-11-14 (×3): qty 2

## 2019-11-14 MED ORDER — LABETALOL HCL 200 MG PO TABS
200.0000 mg | ORAL_TABLET | Freq: Three times a day (TID) | ORAL | Status: DC
  Administered 2019-11-14 – 2019-11-15 (×5): 200 mg via ORAL
  Filled 2019-11-14 (×5): qty 1

## 2019-11-14 MED ORDER — ONDANSETRON HCL 4 MG/2ML IJ SOLN
4.0000 mg | Freq: Four times a day (QID) | INTRAMUSCULAR | Status: DC | PRN
  Administered 2019-11-14: 4 mg via INTRAVENOUS
  Filled 2019-11-14: qty 2

## 2019-11-14 NOTE — ED Notes (Signed)
Breakfast ordered 

## 2019-11-14 NOTE — Consult Note (Signed)
[facility] Renal Consultation Note    Indication for Consultation:  Management of ESRD/hemodialysis, anemia, hypertension/volume, and secondary hyperparathyroidism.  HPI: Tara Mills is a 24 y.o. 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 PMH also includes HTN, anemia and secondary hyperparathyroidism. Patient was recently admitted to [facility] from 10/21/19 to 10/26/19 with HTN emergency with papilledema and vision loss. Patient required serial HD 2/16-2/18 for volume removal however reportedly did not tolerate it well. Pt reported feeling like she was overheating despite double rinsing dialyzer and did not consent to taking more than 1kg off with HD. She resumed PD at discharge with the agreement that treatments must be done and pt be available by phone. Patient had labs at outpatient dialysis center on 3/8. Hgb was 5.6 at that time and PRBC were ordered however pt presented to [facility] ED before receiving any transfusion. Recent labs on 3/8 also notable for Phos 13.0, PTH 2277, Cr 18.18.   Today, patient reports shortness of breath, orthopnea, cough, and rib pain with inspiration. She presented to the ED this AM as she was concerned that she may have pneumonia. Patient also reported chronic diarrhea. Reports new R sided leg pain. States she feels her PD fluid is filling and draining slower than normal. Also reports lower abdominal pain and swelling. Denies any cloudy fluid, fever and chills. Patient reports she completes four 6 hour dwells throughout the day, 2L using all 2.5% fluid. Reports she had abdominal discomfort when she tries to use 4.25% exchanges. She remains on mycophenolate and tactrolimus for hx transplant, reports good compliance. Reports TDC was recently removed.  On presentation to the ED, Hgb 5.9. POC FOBT negative.  BUN 60, Cr 19.33, K 4.2, Ca 8.0. POC Covid test negative. BP significantly elevated. Chest x-ray notable for cardiomegaly with interstitial edema and possible underlying pericardial effusion. Bedside ultrasound by ED provider revealed small pericardial effusion. 1 unit PRBC ordered.     Past Medical History:  Diagnosis Date  . Anemia   . Hypertension   . Renal disorder    Past Surgical History:  Procedure Laterality Date  . NEPHRECTOMY TRANSPLANTED ORGAN     2007   ROS: As per HPI otherwise negative.  Physical Exam: Vitals:   11/14/19 0734 11/14/19 0800 11/14/19 0842 11/14/19 0843  BP: (!) 199/137 (!) 176/138 (!) 180/137 (!) 180/137  Pulse: (!) 104 97 91 91  Resp: (!) 24 (!) 30 20 20   Temp: 98.8 F (37.1 C)  98.8 F (37.1 C) 98.8 F (37.1 C)  TempSrc: Oral  Oral Oral  SpO2: 97% 92% 100% 100%  Weight:      Height:         General: Well developed, well nourished, female, appears uncomfortable but in NAD. Head: Normocephalic, atraumatic, sclera non-icteric, mucus membranes are moist. Neck: Supple without lymphadenopathy/masses. JVD not elevated. Lungs: Diffuse crackles bilateral lower lobes, no wheezing auscultated  Heart: RRR with normal S1, S2. No murmurs, rubs, or gallops appreciated. Abdomen: Soft, mildly tender on palpable b/l lower quadrants, PD cath site without erythema/drainage Musculoskeletal:  Strength and tone appear normal for age. Lower extremities: No edema or ischemic changes, no open wounds. Neuro: Alert and oriented X 3. Moves all extremities spontaneously. Psych:  Responds to questions appropriately with a normal affect. Dialysis Access: PD cath site without erythema/drainage  Allergies  Allergen Reactions  .  Nsaids Anaphylaxis and Other (See Comments)    Due to ESRD   . Grapefruit Extract Other (See Comments)    Per pt: can't have because of medications  . Other     Other reaction(s): Unknown   Prior to Admission medications   Medication Sig  Start Date End Date Taking? Authorizing Provider  calcitRIOL (ROCALTROL) 0.5 MCG capsule Take 1 capsule (0.5 mcg total) by mouth 3 (three) times a week. Patient taking differently: Take 1.5 mcg by mouth 3 (three) times a week.  10/28/19 11/27/19 Yes Aslam, Loralyn Freshwater, MD  cinacalcet (SENSIPAR) 30 MG tablet Take 1 tablet (30 mg total) by mouth daily with breakfast. Patient taking differently: Take 30 mg by mouth 3 (three) times a week.  10/26/19 11/25/19 Yes Aslam, Loralyn Freshwater, MD  CLARITIN 10 MG tablet Take 10 mg by mouth daily as needed for allergies.  09/18/19  Yes [provider]  diphenhydrAMINE (BENADRYL) 25 mg capsule Take 25 mg by mouth as needed for allergies or sleep.   Yes [provider]  DULCOLAX 5 MG EC tablet Take 5 mg by mouth at bedtime as needed for mild constipation.  10/08/19  Yes [provider]  famotidine (PEPCID) 20 MG tablet Take 20 mg by mouth at bedtime.   Yes [provider]  gentamicin cream (GARAMYCIN) 0.1 % Apply 1 application topically daily.  09/03/19  Yes [provider]  isosorbide-hydrALAZINE (BIDIL) 20-37.5 MG tablet Take 2 tablets by mouth 3 (three) times daily. 10/26/19 11/25/19 Yes Aslam, Loralyn Freshwater, MD  labetalol (NORMODYNE) 200 MG tablet Take 1 tablet (200 mg total) by mouth 3 (three) times daily. 10/26/19 11/25/19 Yes Aslam, Loralyn Freshwater, MD  lactulose (CHRONULAC) 10 GM/15ML solution Take 30 mLs by mouth as needed for mild constipation.  10/08/19  Yes [provider]  losartan (COZAAR) 50 MG tablet Take 1 tablet (50 mg total) by mouth daily. 10/26/19 11/25/19 Yes Aslam, Loralyn Freshwater, MD  MIRALAX 17 GM/SCOOP powder Take 1 Container by mouth as needed for mild constipation.  10/08/19  Yes [provider]  multivitamin (RENA-VIT) TABS tablet Take 1 tablet by mouth at bedtime. 10/26/19 11/25/19 Yes Aslam, Loralyn Freshwater, MD  mycophenolate (CELLCEPT) 250 MG capsule Take 2 capsules (500 mg total) by mouth 2 (two) times daily. 10/26/19 11/25/19 Yes Aslam, Sadia,  MD  NAC 600 600 MG CAPS Take 600 mg by mouth daily as needed.  09/18/19  Yes [provider]  sevelamer carbonate (RENVELA) 800 MG tablet Take 1,600 mg by mouth 3 (three) times daily with meals.   Yes [provider]  tacrolimus (PROGRAF) 1 MG capsule Take 3 mg by mouth 2 (two) times daily. 3 tabs in the AM and 3 tabs in the evening 09/04/19  Yes [provider]  amLODipine (NORVASC) 10 MG tablet Take 1 tablet (10 mg total) by mouth daily. 11/07/19 02/05/20  Miquel Dunn, NP  ramelteon (ROZEREM) 8 MG tablet Take 1 tablet (8 mg total) by mouth at bedtime. 10/26/19 11/25/19  [name], MD   Current Facility-Administered Medications  Medication Dose Route Frequency Provider Last Rate Last Admin  . 0.9 %  sodium chloride infusion  10 mL/hr Intravenous Once Ina Homes, MD      . acetaminophen (TYLENOL) tablet 650 mg  650 mg Oral Q6H PRN Ina Homes, MD       Or  . acetaminophen (TYLENOL) suppository 650 mg  650 mg Rectal Q6H PRN Helberg, Justin, MD      . amLODipine (NORVASC) tablet 10  mg  10 mg Oral Daily Ina Homes, MD      . Derrill Memo ON 11/15/2019] calcitRIOL (ROCALTROL) capsule 0.5 mcg  0.5 mcg Oral Once per day on Mon Wed Fri [place], Justin, MD      . cinacalcet [facility]) tablet 30 mg  30 mg Oral Q breakfast Helberg, Justin, MD      . dialysis solution 2.5% low-MG/low-CA dianeal solution   Intraperitoneal Q24H Kirsta Probert G, PA-C      . dialysis solution 4.25% low-MG/low-CA dianeal solution   Intraperitoneal Q24H Donald Jacque G, PA-C      . famotidine (PEPCID) tablet 20 mg  20 mg Oral QHS Helberg, Justin, MD      . ferric citrate (AURYXIA) tablet 420 mg  420 mg Oral TID WC Ina Homes, MD   420 mg at 11/14/19 0754  . gentamicin cream (GARAMYCIN) 0.1 % 1 application  1 application Topical Daily Tieisha Darden G, PA-C      . guaiFENesin-codeine 100-10 MG/5ML solution 5 mL  5 mL Oral Q6H PRN Ina Homes, MD   5 mL at 11/14/19 0617   . heparin 1000 unit/ml injection 500 Units  500 Units Intraperitoneal PRN Sarabeth Benton G, PA-C      . heparin injection 5,000 Units  5,000 Units Subcutaneous Q8H Helberg, Justin, MD      . isosorbide-hydrALAZINE (BIDIL) 20-37.5 MG per tablet 2 tablet  2 tablet Oral TID Ina Homes, MD      . labetalol (NORMODYNE) tablet 200 mg  200 mg Oral Q8H Helberg, Justin, MD   200 mg at 11/14/19 0700  . losartan (COZAAR) tablet 50 mg  50 mg Oral Daily Helberg, Justin, MD      . multivitamin (RENA-VIT) tablet 1 tablet  1 tablet Oral QHS Helberg, Justin, MD      . mycophenolate (CELLCEPT) capsule 500 mg  500 mg Oral BID Helberg, Justin, MD      . ondansetron (ZOFRAN) tablet 4 mg  4 mg Oral Q6H PRN Ina Homes, MD       Or  . ondansetron (ZOFRAN) injection 4 mg  4 mg Intravenous Q6H PRN Ina Homes, MD   4 mg at 11/14/19 0616  . ramelteon (ROZEREM) tablet 8 mg  8 mg Oral QHS Helberg, Justin, MD      . tacrolimus (PROGRAF) capsule 2 mg  2 mg Oral BID Ina Homes, MD       Current Outpatient Medications  Medication Sig Dispense Refill  . calcitRIOL (ROCALTROL) 0.5 MCG capsule Take 1 capsule (0.5 mcg total) by mouth 3 (three) times a week. (Patient taking differently: Take 1.5 mcg by mouth 3 (three) times a week. ) 12 capsule 0  . cinacalcet (SENSIPAR) 30 MG tablet Take 1 tablet (30 mg total) by mouth daily with breakfast. (Patient taking differently: Take 30 mg by mouth 3 (three) times a week. ) 30 tablet 0  . CLARITIN 10 MG tablet Take 10 mg by mouth daily as needed for allergies.     . diphenhydrAMINE (BENADRYL) 25 mg capsule Take 25 mg by mouth as needed for allergies or sleep.    . DULCOLAX 5 MG EC tablet Take 5 mg by mouth at bedtime as needed for mild constipation.     . famotidine (PEPCID) 20 MG tablet Take 20 mg by mouth at bedtime.    Marland Kitchen gentamicin cream (GARAMYCIN) 0.1 % Apply 1 application topically daily.     . isosorbide-hydrALAZINE (BIDIL) 20-37.5 MG tablet Take 2 tablets by  mouth 3 (three) times daily. 180 tablet 0  . labetalol (NORMODYNE) 200 MG tablet Take 1 tablet (200 mg total) by mouth 3 (three) times daily. 90 tablet 0  . lactulose (CHRONULAC) 10 GM/15ML solution Take 30 mLs by mouth as needed for mild constipation.     Marland Kitchen losartan (COZAAR) 50 MG tablet Take 1 tablet (50 mg total) by mouth daily. 30 tablet 0  . MIRALAX 17 GM/SCOOP powder Take 1 Container by mouth as needed for mild constipation.     . multivitamin (RENA-VIT) TABS tablet Take 1 tablet by mouth at bedtime. 30 tablet 0  . mycophenolate (CELLCEPT) 250 MG capsule Take 2 capsules (500 mg total) by mouth 2 (two) times daily. 120 capsule 0  . NAC 600 600 MG CAPS Take 600 mg by mouth daily as needed.     . sevelamer carbonate (RENVELA) 800 MG tablet Take 1,600 mg by mouth 3 (three) times daily with meals.    . tacrolimus (PROGRAF) 1 MG capsule Take 3 mg by mouth 2 (two) times daily. 3 tabs in the AM and 3 tabs in the evening    . amLODipine (NORVASC) 10 MG tablet Take 1 tablet (10 mg total) by mouth daily. 30 tablet 1  . ramelteon (ROZEREM) 8 MG tablet Take 1 tablet (8 mg total) by mouth at bedtime. 30 tablet 0   Labs: Basic Metabolic Panel: Recent Labs  Lab 11/14/19 0022  NA 140  K 4.2  CL 100  CO2 19*  GLUCOSE 111*  BUN 68*  CREATININE 19.33*  CALCIUM 8.0*   CBC: Recent Labs  Lab 11/14/19 0022  WBC 6.3  NEUTROABS 5.2  HGB 5.9*  HCT 18.7*  MCV 105.1*  PLT 172   Studies/Results: DG Chest 2 View  Result Date: 11/14/2019 CLINICAL DATA:  Cough and chest congestion. EXAM: CHEST - 2 VIEW COMPARISON:  10/21/2019 FINDINGS: The previously demonstrated tunneled dialysis catheter has been removed. The heart size is significantly enlarged but stable from prior study. The silhouette has however increased across studies dating back to 2020. Interstitial edema is noted. There are probable trace bilateral pleural effusions. No focal infiltrate. No large pneumothorax. IMPRESSION: 1. Interval  removal of the previously demonstrated tunneled dialysis catheter. 2. Cardiomegaly with interstitial edema. The patient's cardiac silhouette has increased across multiple prior studies dating back to 2020. An underlying pericardial effusion is not excluded and can be correlated with an echocardiogram as clinically indicated. Electronically Signed   By: Constance Holster M.D.   On: 11/14/2019 00:38    Dialysis Orders:  [place] Nephrologist: Dr. Johnney Ou CAPD, 4 exchanged/6 hour dwells, all 2.5% per pt, EDW 57kg Calcitriol 0.75 mcg 3x week Aranesp 200 mcg SubQ every 2 weeks- last dose 09/25/19 Velphoro 576m 2 tabs PO TID Sensipar 342mQD  BP meds: Bidil 20-37.61m54m tabs TID, labetalol 200m24mD, losartan 50mg79m amlodipine 10mg 78mAssessment/Plan: 1.  Hypertension/Acute on chronic HFpEF: BP significantly elevated, + edema on x-ray and bedside US witKoreasmall pericardial effusion. Needs more aggressive fluid removal. Patient using all 2.5% exchanges at home. Will attempt PD today and overnight with half 2.5% and half 4.25% for volume removal. Resume home BP meds. Unfortunately patient did not tolerate high UF with HD during last admission. 2.  ESRD:  On CAPD with 4 exchanges throughout the day. Will resume CCPD here. No longer has HD access however there is a possibility a PD cath will have to be placed if volume cannot be  controlled by PD.  I spoke with pt's outpatient PD nurse who suspects a component of noncompliance. Will get KUB to evaluate catheter placement as patient reports abdominal pain and bloating.  3.  Anemia: Presented with Hgb 5.9, no bleeding reported. Appears last aranesp dose was during last hospitalization, will confirm with outpatient HD. 1 unit PRBC ordered. Pt follows with hematology outpatient- had initial consult on 10/30/19 with Dr. Jacqulyn Ducking, concern for shortened RBC survival and recommended continued ESA with possible bone marrow biopsy in the future. Will  resume aranesp here once post-transfusion Hgb obtained.  4.  Metabolic bone disease: Calcium controlled. Outpatient PTH and phos elevated. Resume calcitriol, sensipar, and velphoro. Reportedly scheduled for parathyroidectomy next month.  5.  Nutrition:  Renal diet/fluid restrictions 6. Hx failed transplant: Continue cell cept and prograf.  Anice Paganini, PA-C 11/14/2019, 9:11 AM  [facility] Pager: [phone]

## 2019-11-14 NOTE — ED Provider Notes (Addendum)
[facility] EMERGENCY DEPARTMENT Provider Note   CSN: [id number] Arrival date & time: 11/14/19  0005     History Chief Complaint  Patient presents with  . cough/chest congestion    Tara Mills is a 24 y.o. female possible history of anemia, hypertension, renal dysfunction who presents for evaluation of cough, congestion that has been ongoing for the last 3 to 4 days.  She states cough is productive of green phlegm.  She states that this has caused her chest to be sore.  She is also felt like she has had some trouble breathing.  She states that she recently had pneumonia in February was concerned that it had returned.  She reports that she went to urgent care today and was given levofloxacin which she took 1 dose of.  She reports that at home, she still had symptoms, prompting ED visit.  She has not measured any fever at home.  She denies any chills.  She has had some diarrhea but denies any vomiting, abdominal pain.  She states she feels some slight abdominal bloating.  Denies any dysuria, hematuria.  Patient reports that she has had history of transfusions before and has been anemic.  She does not know why she is anemic.  She has not noted any blood in stool. She is a hemodialysis patient.   The history is provided by the patient.       Past Medical History:  Diagnosis Date  . Anemia   . Hypertension   . Renal disorder     Patient Active Problem List   Diagnosis Date Noted  . Acute on chronic anemia 11/14/2019  . 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 NEPHRECTOMY TRANSPLANTED ORGAN     2007     OB History   No obstetric history on file.     No family history on file.  Social History   Tobacco Use  . Smoking status: Never Smoker  . Smokeless tobacco: Never Used  Substance Use Topics  . Alcohol use: Not Currently  . Drug use: Never    Home Medications Prior to Admission medications    Medication Sig Start Date End Date Taking? Authorizing Provider  calcitRIOL (ROCALTROL) 0.5 MCG capsule Take 1 capsule (0.5 mcg total) by mouth 3 (three) times a week. Patient taking differently: Take 1.5 mcg by mouth 3 (three) times a week.  10/28/19 11/27/19 Yes Aslam, Loralyn Freshwater, MD  cinacalcet (SENSIPAR) 30 MG tablet Take 1 tablet (30 mg total) by mouth daily with breakfast. Patient taking differently: Take 30 mg by mouth 3 (three) times a week.  10/26/19 11/25/19 Yes Aslam, Loralyn Freshwater, MD  CLARITIN 10 MG tablet Take 10 mg by mouth daily as needed for allergies.  09/18/19  Yes [provider]  diphenhydrAMINE (BENADRYL) 25 mg capsule Take 25 mg by mouth as needed for allergies or sleep.   Yes [provider]  DULCOLAX 5 MG EC tablet Take 5 mg by mouth at bedtime as needed for mild constipation.  10/08/19  Yes [provider]  famotidine (PEPCID) 20 MG tablet Take 20 mg by mouth at bedtime.   Yes [provider]  gentamicin cream (GARAMYCIN) 0.1 % Apply 1 application topically daily.  09/03/19  Yes [provider]  isosorbide-hydrALAZINE (BIDIL) 20-37.5 MG tablet Take 2 tablets by mouth 3 (three) times daily. 10/26/19 11/25/19 Yes Aslam, Loralyn Freshwater, MD  labetalol (NORMODYNE) 200 MG tablet Take 1 tablet (200 mg  total) by mouth 3 (three) times daily. 10/26/19 11/25/19 Yes Aslam, Loralyn Freshwater, MD  lactulose (CHRONULAC) 10 GM/15ML solution Take 30 mLs by mouth as needed for mild constipation.  10/08/19  Yes [provider]  losartan (COZAAR) 50 MG tablet Take 1 tablet (50 mg total) by mouth daily. 10/26/19 11/25/19 Yes Aslam, Loralyn Freshwater, MD  MIRALAX 17 GM/SCOOP powder Take 1 Container by mouth as needed for mild constipation.  10/08/19  Yes [provider]  multivitamin (RENA-VIT) TABS tablet Take 1 tablet by mouth at bedtime. 10/26/19 11/25/19 Yes Aslam, Loralyn Freshwater, MD  mycophenolate (CELLCEPT) 250 MG capsule Take 2 capsules (500 mg total) by mouth 2 (two) times daily. 10/26/19 11/25/19  Yes Aslam, Sadia, MD  NAC 600 600 MG CAPS Take 600 mg by mouth daily as needed.  09/18/19  Yes [provider]  sevelamer carbonate (RENVELA) 800 MG tablet Take 1,600 mg by mouth 3 (three) times daily with meals.   Yes [provider]  tacrolimus (PROGRAF) 1 MG capsule Take 3 mg by mouth 2 (two) times daily. 3 tabs in the AM and 3 tabs in the evening 09/04/19  Yes [provider]  amLODipine (NORVASC) 10 MG tablet Take 1 tablet (10 mg total) by mouth daily. 11/07/19 02/05/20  Miquel Dunn, NP  ramelteon (ROZEREM) 8 MG tablet Take 1 tablet (8 mg total) by mouth at bedtime. 10/26/19 11/25/19  [name], MD    Allergies    Nsaids, Grapefruit extract, and Other  Review of Systems   Review of Systems  Constitutional: Negative for fever.  Respiratory: Positive for cough and shortness of breath.   Cardiovascular: Positive for chest pain (soreness).  Gastrointestinal: Negative for abdominal pain, nausea and vomiting.  Genitourinary: Negative for dysuria and hematuria.  Neurological: Negative for headaches.  All other systems reviewed and are negative.   Physical Exam Updated Vital Signs BP (!) 188/126   Pulse (!) 106   Temp 98.5 F (36.9 C)   Resp 19   Ht 5' (1.524 m)   Wt 65 kg   LMP 11/13/2019   SpO2 95%   BMI 27.99 kg/m   Physical Exam Vitals and nursing note reviewed. Exam conducted with a chaperone present.  Constitutional:      Appearance: Normal appearance. She is well-developed.  HENT:     Head: Normocephalic and atraumatic.  Eyes:     General: Lids are normal.     Conjunctiva/sclera: Conjunctivae normal.     Pupils: Pupils are equal, round, and reactive to light.  Cardiovascular:     Rate and Rhythm: Normal rate and regular rhythm.     Pulses: Normal pulses.     Heart sounds: Normal heart sounds. No murmur. No friction rub. No gallop.   Pulmonary:     Effort: Pulmonary effort is normal.     Comments: Mild rales noted bilateral  bases.  No evidence of respiratory distress.  Able speak in full sentences without any difficulty. Abdominal:     Palpations: Abdomen is soft. Abdomen is not rigid.     Tenderness: There is no abdominal tenderness. There is no guarding.  Genitourinary:    Comments: The exam was performed with a chaperone present. Digital Rectal Exam reveals sphincter with good tone. No external hemorrhoids. No masses or fissures. Stool color is brown with no overt blood. Musculoskeletal:        General: Normal range of motion.     Cervical back: Full passive range of motion without pain.  Skin:  General: Skin is warm and dry.     Capillary Refill: Capillary refill takes less than 2 seconds.     Coloration: Skin is pale.  Neurological:     Mental Status: She is alert and oriented to person, place, and time.  Psychiatric:        Speech: Speech normal.     ED Results / Procedures / Treatments   Labs (all labs ordered are listed, but only abnormal results are displayed) Labs Reviewed  CBC WITH DIFFERENTIAL/PLATELET - Abnormal; Notable for the following components:      Result Value   RBC 1.78 (*)    Hemoglobin 5.9 (*)    HCT 18.7 (*)    MCV 105.1 (*)    RDW 15.6 (*)    Lymphs Abs 0.6 (*)    All other components within normal limits  BASIC METABOLIC PANEL - Abnormal; Notable for the following components:   CO2 19 (*)    Glucose, Bld 111 (*)    BUN 68 (*)    Creatinine, Ser 19.33 (*)    Calcium 8.0 (*)    GFR calc non Af Amer 2 (*)    GFR calc Af Amer 3 (*)    Anion gap 21 (*)    All other components within normal limits  URINALYSIS, ROUTINE W REFLEX MICROSCOPIC - Abnormal; Notable for the following components:   APPearance CLOUDY (*)    pH 9.0 (*)    Glucose, UA 150 (*)    Protein, ur >=300 (*)    Bacteria, UA RARE (*)    Non Squamous Epithelial 0-5 (*)    All other components within normal limits  I-STAT BETA HCG BLOOD, ED ([facility], [facility], [facility] ONLY) - Abnormal; Notable for the following  components:   I-stat hCG, quantitative 8.5 (*)    All other components within normal limits  PREGNANCY, URINE  POC OCCULT BLOOD, ED  POC SARS CORONAVIRUS 2 AG -  ED  TYPE AND SCREEN  PREPARE RBC (CROSSMATCH)    EKG None  Radiology DG Chest 2 View  Result Date: 11/14/2019 CLINICAL DATA:  Cough and chest congestion. EXAM: CHEST - 2 VIEW COMPARISON:  10/21/2019 FINDINGS: The previously demonstrated tunneled dialysis catheter has been removed. The heart size is significantly enlarged but stable from prior study. The silhouette has however increased across studies dating back to 2020. Interstitial edema is noted. There are probable trace bilateral pleural effusions. No focal infiltrate. No large pneumothorax. IMPRESSION: 1. Interval removal of the previously demonstrated tunneled dialysis catheter. 2. Cardiomegaly with interstitial edema. The patient's cardiac silhouette has increased across multiple prior studies dating back to 2020. An underlying pericardial effusion is not excluded and can be correlated with an echocardiogram as clinically indicated. Electronically Signed   By: Constance Holster M.D.   On: 11/14/2019 00:38    Procedures .Critical Care Performed by: Volanda Napoleon, PA-C Authorized by: Volanda Napoleon, PA-C   Critical care provider statement:    Critical care time (minutes):  40   Critical care was necessary to treat or prevent imminent or life-threatening deterioration of the following conditions:  Circulatory failure and renal failure   Critical care was time spent personally by me on the following activities:  Discussions with consultants, evaluation of patient's response to treatment, examination of patient, ordering and performing treatments and interventions, ordering and review of laboratory studies, ordering and review of radiographic studies, pulse oximetry, re-evaluation of patient's condition, obtaining history from patient or surrogate and review  of old  charts   (including critical care time)  Medications Ordered in ED Medications  0.9 %  sodium chloride infusion (has no administration in time range)  amLODipine (NORVASC) tablet 10 mg (has no administration in time range)  calcitRIOL (ROCALTROL) capsule 0.5 mcg (has no administration in time range)  cinacalcet (SENSIPAR) tablet 30 mg (has no administration in time range)  famotidine (PEPCID) tablet 20 mg (has no administration in time range)  ferric citrate (AURYXIA) tablet 420 mg (has no administration in time range)  isosorbide-hydrALAZINE (BIDIL) 20-37.5 MG per tablet 2 tablet (has no administration in time range)  labetalol (NORMODYNE) tablet 200 mg (200 mg Oral Given 11/14/19 0700)  losartan (COZAAR) tablet 50 mg (has no administration in time range)  multivitamin (RENA-VIT) tablet 1 tablet (has no administration in time range)  mycophenolate (CELLCEPT) capsule 500 mg (has no administration in time range)  ramelteon (ROZEREM) tablet 8 mg (has no administration in time range)  tacrolimus (PROGRAF) capsule 2 mg (has no administration in time range)  heparin injection 5,000 Units (has no administration in time range)  acetaminophen (TYLENOL) tablet 650 mg (has no administration in time range)    Or  acetaminophen (TYLENOL) suppository 650 mg (has no administration in time range)  ondansetron (ZOFRAN) tablet 4 mg ( Oral See Alternative 11/14/19 0616)    Or  ondansetron (ZOFRAN) injection 4 mg (4 mg Intravenous Given 11/14/19 0616)  guaiFENesin-codeine 100-10 MG/5ML solution 5 mL (5 mLs Oral Given 11/14/19 0617)    ED Course  I have reviewed the triage vital signs and the nursing notes.  Pertinent labs & imaging results that were available during my care of the patient were reviewed by me and considered in my medical decision making (see chart for details).    MDM Rules/Calculators/A&P                      24 year old female who presents for evaluation of cough, congestion, chest  discomfort, shortness of breath.  She states is been ongoing for last couple days.  Seen at urgent care today and discharged with Levaquin.  States at home, she continued to have symptoms and felt tired, prompting ED visit.  On initially arrival, she is afebrile, she is nontoxic-appearing.  Blood pressure slightly elevated.  Vitals otherwise stable.  On exam, she does appear pale.  No evidence of respiratory distress.  Digital rectal exam shows no evidence of bloody stools.  Review of records show that she does have a history of chronic anemia and has required transfusions in the past.  Plan to check labs, chest x-ray.  I-STAT beta slightly elevated 8.5.  I suspect this is most likely false-positive.  POC occult is negative.  CBC shows no leukocytosis.  Hemoglobin is 5.9.  She has had anemia in the past.  BMP shows normal potassium.  BUN is elevated at 60, creatinine is 19.33.  Her anion gap is 21.  She does do peritoneal dialysis and does state she did it today.  Chest x-ray shows cardiomegaly with possible interstitial edema.  At this time, given the need for transfusion, dialysis as well as she is likely symptomatic from her anemia, feel that admission is warranted.  Dr. Christy Gentles performed bedside ultrasound.  Please see his note for further evaluation.  Discussed patient with Internal Medicine team who accepts patient for admission.   Portions of this note were generated with [employer]. Dictation errors may occur despite best attempts at proofreading.  Final Clinical Impression(s) / ED Diagnoses Final diagnoses:  Symptomatic anemia  Cough  Shortness of breath    Rx / DC Orders ED Discharge Orders    None       Volanda Napoleon, PA-C 11/14/19 0711    Volanda Napoleon, PA-C 11/16/19 1426    Ripley Fraise, MD 11/17/19 2307

## 2019-11-14 NOTE — H&P (Signed)
Date: 11/14/2019               Patient Name:  Tara Mills MRN: [id number]  DOB: 1995-11-21 Age / Sex: 24 y.o., female   PCP: Patient, No Pcp Per         Medical Service: Internal Medicine Teaching Service         Attending Physician: Dr. Velna Ochs, MD    First Contact: Dr. Charleen Kirks Pager: [phone]  Second Contact: Dr. 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 History was obtained via the patient and through chart review.   States that approximately 3 to 4 days ago she developed a productive cough and chest congestion. Her cough is productive of green sputum. She has been coughing so much that she now has chest pain that is worse with deep inspiration and coughing. She also endorses myalgias. She does endorse shortness of breath that has worsened over the last three days and become almost intolerable today. She is not able to lie flat at all. When lying flat she has a coughing fit and cannot breathe. She also states that her SOB is worse with exertion. She states that she was diagnosed with pneumonia in February and is concerned that it is returned. She presented to an urgent care today and was given a prescription for levofloxacin. She subsequently took one dose but did not notice much improvement and just didn't feel right so she presented to the emergency department for further evaluation.   At home she has had no fevers, chills, sore throat, rhinorrhea, nausea/vomiting, dysuria, rashes and bloody stools. She denies sick contacst.  She has had some abdominal bloating and reports diarrhea since discharge from her prior admission. Also of note, she reports some swelling of her left hip and leg without any pain or hx of trauma.   During her last hospitalization she was transitioned to HD and set up on a TRS schedule. Since that time she has been "stable" from a renal standpoint and transitioned back to peritoneal dialysis. Her last session was overnight from 3/9-3/10.   Meds:  No current facility-administered medications on file prior to encounter.   Current Outpatient Medications on File Prior to Encounter  Medication Sig Dispense Refill  . amLODipine (NORVASC) 10 MG tablet Take 1 tablet (10 mg total) by mouth daily. 30 tablet 1  . calcitRIOL (ROCALTROL) 0.5 MCG capsule Take 1 capsule (0.5 mcg total) by mouth 3 (three) times a week. 12 capsule 0  . cinacalcet (SENSIPAR) 30 MG tablet Take 1 tablet (30 mg total) by mouth daily with breakfast. (Patient taking differently: Take 30 mg by mouth as needed. ) 30 tablet 0  . CLARITIN 10 MG tablet Take 10 mg by mouth daily as needed for allergies.     . diphenhydrAMINE (BENADRYL) 25 mg capsule Take 25 mg by mouth as needed for allergies or sleep.    . DULCOLAX 5 MG EC tablet Take 5 mg by mouth at bedtime as needed for mild constipation.     Marland Kitchen  famotidine (PEPCID) 20 MG tablet Take 20 mg by mouth at bedtime.    . ferric citrate (AURYXIA) 1 GM 210 MG(Fe) tablet Take 420 mg by mouth 3 (three) times daily with meals.    Marland Kitchen gentamicin cream (GARAMYCIN) 0.1 % Apply 1 application topically daily.     . isosorbide-hydrALAZINE (BIDIL) 20-37.5 MG tablet Take 2 tablets by mouth 3 (three) times daily. 180 tablet 0  . labetalol (NORMODYNE) 200 MG tablet Take 1 tablet (200 mg total) by mouth 3 (three) times daily. 90 tablet 0  . lactulose (CHRONULAC) 10 GM/15ML solution Take 30 mLs by mouth as needed for mild constipation.     Marland Kitchen losartan (COZAAR) 50 MG tablet Take 1 tablet (50 mg total) by mouth  daily. 30 tablet 0  . MIRALAX 17 GM/SCOOP powder Take 1 Container by mouth as needed for mild constipation.     . multivitamin (RENA-VIT) TABS tablet Take 1 tablet by mouth at bedtime. 30 tablet 0  . mycophenolate (CELLCEPT) 250 MG capsule Take 2 capsules (500 mg total) by mouth 2 (two) times daily. 120 capsule 0  . NAC 600 600 MG CAPS Take 600 mg by mouth daily as needed.     . ramelteon (ROZEREM) 8 MG tablet Take 1 tablet (8 mg total) by mouth at bedtime. 30 tablet 0  . tacrolimus (PROGRAF) 1 MG capsule Take 2 mg by mouth 2 (two) times daily. 3 tabs in the AM and     Allergies: Allergies as of 11/14/2019 - Review Complete 11/14/2019  Allergen Reaction Noted  . Nsaids Anaphylaxis and Other (See Comments) 04/22/2019  . Grapefruit extract Other (See Comments) 10/18/2019  . Other  11/07/2019   Past Medical History:  Diagnosis Date  . Anemia   . Hypertension   . Renal disorder    Surgical Hx: Past Surgical History:  Procedure Laterality Date  . NEPHRECTOMY TRANSPLANTED ORGAN     2007   Family History:  Denies any family history; everyone is healthy  Social History:  Lives at home with fiancee; does not smoke, drink or use other drugs  Review of Systems: A complete ROS was negative except as per HPI.   Physical Exam: Blood pressure (!) 188/126, pulse (!) 106, temperature 98.5 F (36.9 C), resp. rate 19, height 5' (1.524 m), weight 65 kg, last menstrual period 11/13/2019, SpO2 95 %.  General: Thin female in no acute distress HENT: Normocephalic, atraumatic, moist mucus membranes, pale conjunctiva  Pulm: Good air movement, no crackles CV: tachycardic, regular rhythm, no murmurs, no rubs  Abdomen: Active bowel sounds, soft, non-distended, no tenderness to palpation  Extremities: Pulses palpable in all extremities, no LE edema; no tenderness to palpation over L hip  Skin: Warm and dry  Neuro: Alert and oriented x 3  CXR: personally reviewed my interpretation is good  penetration, inspiration, and rotation. No bony or soft tissue abnormalities. Enlarged cardiac silhouette. Pulmonary edema with vascular congestion.   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  Acute on Chronic Anemia of Chronic Disease  - Hgb 5.9 on admission. This is down from a baseline of ~8 -  Denies signs/symptoms of active blood loss; she did start her period today and reports only light bleeding  - On ferric citrate 420 mg TID WC and aranesp  Plan: - Transfuse 1 units pRBCs, follow-up post H&H  Cough/SOB/Acute on Chronic HFpEF - Presenting with congestive symptoms and pulmonary edema of CXR  - Echocardiogram from 10/25/2019 with LVEF of 60-65% with grade II diastolic dysfunction, LAE, and elevated PA pressures.   Plan: - Consult nephrology for volume management, peritoneal dialysis vs transitioning back to HD  ESRD on peritoneal dialysis s/p failed renal transplant  Secondary hyperparathyroidism - Last PD session overnight from 3/9-3/10 - No indication for emergent/urgent HD  - Continue mycophenolate 500 mg BID and tacrolimus 2 mg BID - Continue calcitriol 0.5 mg 3x per week and cinacalcet 30 mg QD with breakfast  Plan: - Consult nephrology later this AM   Elevated POC beta-hCG - Elevated to 8.5 on POC   Plan: - Follow-up blood levels  Hypertension  - Continue amlodipine 10 mg QD, BiDil 40-75mg  BID, Labetalol 200 mg TID, and Losartan 50 mg QD  Diet: Renal VTE ppx: Heparin CODE STATUS: Full Code  Dispo: Admit patient to Observation with expected length of stay less than 2  midnights.  Signed: Al Decant, MD 11/14/2019, 6:01 AM

## 2019-11-14 NOTE — ED Provider Notes (Signed)
Patient chronically ill with history of ESRD presents with increasing cough and congestion. She is coughing frequently during exam. Due to questionable signs of cardiomegaly due to pericardial effusion I performed a bedside ultrasound. It did reveal small pericardial effusion. Patient will require formal echocardiogram after admitted. Patient also has acute anemia which will need to be addressed with transfusion  Ultrasound ED Echo  Date/Time: 11/14/2019 4:50 AM Performed by: Ripley Fraise, MD Authorized by: Ripley Fraise, MD   Procedure details:    Indications: dyspnea     Views: parasternal long axis view     Images: archived     Limitations:  Body habitus and positioning Findings:    Pericardium: small pericardial effusion   Impression:    Impression: pericardial effusion present        Ripley Fraise, MD 11/14/19 [phone]

## 2019-11-14 NOTE — Progress Notes (Addendum)
Subjective: HD#1  Today, Tara Mills reports that she still feels nauseas with abdomainal bloating. She is still unable to lie flat without experiencing shortness of breath. She endorses cough with minimal clear sputum production. She has had diarrhea since her last hospital visit. She has bout 4 bowel movements a day. Her stool look "flaky." She denies hematochezia/melena and feels like she cannot empty her bowel. In additional, she notes right leg pain since transfusion started earlier this AM that has not yet resolved.   She states her menstrual cycle began yesterday and is normal.   Objective:  Vital signs in last 24 hours: Vitals:   11/14/19 0842 11/14/19 0843 11/14/19 0900 11/14/19 0947  BP: (!) 180/137 (!) 180/137 (!) 181/135 (!) 180/132  Pulse: 91 91 92 93  Resp: 20 20 18 18   Temp: 98.8 F (37.1 C) 98.8 F (37.1 C)  98 F (36.7 C)  TempSrc: Oral Oral  Oral  SpO2: 100% 100% 100% 100%  Weight:      Height:       Const: In no apparent distress, lying comfortably in bed, conversational HEENT: Atraumatic, normocephalic Resp: Bilateral crackles to the mid-lung fields. No wheezes. Normal work of breathing.  CV: RRR, no murmurs, gallop, rub Abd: Bowel sounds present, nondistended, nontender to palpation PD port present and covered Ext: No lower extremity edema, skin is warm to touch Neuro: Cranial nerves II to XII grossly intact without focal neurological deficits Skin: Warm, no rashes. 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  # Acute Hypoxic Respiratory Failure 2/2 Volume Overload  This is likely  secondary to inadequate volume removal with PD. On examination, she continues to require supplementation oxygen bilateral crackles and significant orthopnea. Likely 2/2 to inadequate volume removal with PD.   - Continuous pulse oximetry - Supplemental oxygen PRN - Wean oxygen as tolerated   # ESRD on PD # Congenital Renal Dysplasia (L) with (R) renal transplant c/b graft rejection  # Secondary Hyperparathyroidism In the past, patient had difficulty tolerating HD and this is why she was transitioned to PD. Nephrology has been consulted and are recommending HD. Unfortunately, Ms. Simao is not willing to try HD at this time. Nephrology plans to try diuresing with adjustments in PD solution and addition of diuretics.   - Nephrology following, we appreciate their recommendations  - Dialysis solution (1/2) 2.5% with (1/2) 4.25% added - Torsemide 100 mg  - Mycophenolate 500 mg BID - Tacrolimus 2 mg BID (home dose is 3 mg BID) - Calcitriol 0.5 mg 3x per week - Cinacalcet 30 mg QD with breakfast  # Acute on Chronic Normocytic Anemia  Hemoglobin on admission is 5.9, an acute drop from 1 month ago, at which time hemoglobin was 7.6. She has a history of similar drops and has prompted evaluation from outpatient Hematology. Dr. Harlow Asa is concerned for possible hemolysis.   - s/p 2 units of pRBCs - post-transfusion H/H pending - Continue Aransep   # Hypertension  Restart home medications. Will discuss medication compliance as this has been an issue in the past.   - Amlodipine 10 mg QD - BiDil 40-75mg  BID -  Labetalol 200 mg TID - Losartan 50 mg QD  # Elevated POC beta-hCG Urine pregnancy negative. Likely secondary to PD.   - serum hCG pending   # Glucosuria  Urinalysis shows elevated glucose levels. Differential includes diabetes versus secondary to immunosuppresive medication patient is taking versus intrinsic renal defect. A1c has been low in the past but this is not reflective of  diabetic status due to chronic anemia and ESRD. C-peptide has been high when measured in September 2020. Only one CBG on admission that was mildly elevated at 111 at midnight. Will work up for diabetes.   - CBG ordered - CBG daily in the AM   Prior to Admission Living Arrangement: Home Anticipated Discharge Location: Home Barriers to Discharge: Continued medical evaluation Dispo: Anticipated discharge in approximately 1-2 day(s).   Dr. Jose Persia Internal Medicine PGY-1  Pager: [phone] 11/14/2019, 1:11 PM

## 2019-11-14 NOTE — ED Triage Notes (Signed)
Patient reports headache with productive cough/chest congestion onset this week , denies fever or chills , currently taking oral antibiotic for pneumonia .

## 2019-11-14 NOTE — Progress Notes (Signed)
Pt refusing any HD access / HD at this time, will see if we can get her volume under control w/ extra PD.  Please hold any further blood transfusions if possible until her vol status is more stable.   Kelly Splinter, MD 11/14/2019, 12:14 PM

## 2019-11-15 DIAGNOSIS — J9601 Acute respiratory failure with hypoxia: Secondary | ICD-10-CM | POA: Diagnosis not present

## 2019-11-15 DIAGNOSIS — I5030 Unspecified diastolic (congestive) heart failure: Secondary | ICD-10-CM

## 2019-11-15 DIAGNOSIS — D649 Anemia, unspecified: Secondary | ICD-10-CM | POA: Diagnosis not present

## 2019-11-15 DIAGNOSIS — I132 Hypertensive heart and chronic kidney disease with heart failure and with stage 5 chronic kidney disease, or end stage renal disease: Secondary | ICD-10-CM | POA: Diagnosis not present

## 2019-11-15 DIAGNOSIS — R739 Hyperglycemia, unspecified: Secondary | ICD-10-CM

## 2019-11-15 LAB — CBC WITH DIFFERENTIAL/PLATELET
Abs Immature Granulocytes: 0.06 10*3/uL (ref 0.00–0.07)
Basophils Absolute: 0 10*3/uL (ref 0.0–0.1)
Basophils Relative: 1 %
Eosinophils Absolute: 0.2 10*3/uL (ref 0.0–0.5)
Eosinophils Relative: 4 %
HCT: 26.4 % — ABNORMAL LOW (ref 36.0–46.0)
Hemoglobin: 8.6 g/dL — ABNORMAL LOW (ref 12.0–15.0)
Immature Granulocytes: 1 %
Lymphocytes Relative: 13 %
Lymphs Abs: 0.7 10*3/uL (ref 0.7–4.0)
MCH: 30.5 pg (ref 26.0–34.0)
MCHC: 32.6 g/dL (ref 30.0–36.0)
MCV: 93.6 fL (ref 80.0–100.0)
Monocytes Absolute: 0.5 10*3/uL (ref 0.1–1.0)
Monocytes Relative: 8 %
Neutro Abs: 4.2 10*3/uL (ref 1.7–7.7)
Neutrophils Relative %: 73 %
Platelets: 196 10*3/uL (ref 150–400)
RBC: 2.82 MIL/uL — ABNORMAL LOW (ref 3.87–5.11)
RDW: 18 % — ABNORMAL HIGH (ref 11.5–15.5)
WBC: 5.7 10*3/uL (ref 4.0–10.5)
nRBC: 0 % (ref 0.0–0.2)

## 2019-11-15 LAB — BPAM RBC
Blood Product Expiration Date: 202104112359
Blood Product Expiration Date: 202104112359
ISSUE DATE / TIME: 202103110442
ISSUE DATE / TIME: 202103110835
Unit Type and Rh: 8400
Unit Type and Rh: 8400

## 2019-11-15 LAB — TYPE AND SCREEN
ABO/RH(D): AB POS
Antibody Screen: NEGATIVE
Unit division: 0
Unit division: 0

## 2019-11-15 LAB — RENAL FUNCTION PANEL
Albumin: 3 g/dL — ABNORMAL LOW (ref 3.5–5.0)
Anion gap: 23 — ABNORMAL HIGH (ref 5–15)
BUN: 58 mg/dL — ABNORMAL HIGH (ref 6–20)
CO2: 21 mmol/L — ABNORMAL LOW (ref 22–32)
Calcium: 8.5 mg/dL — ABNORMAL LOW (ref 8.9–10.3)
Chloride: 98 mmol/L (ref 98–111)
Creatinine, Ser: 16.7 mg/dL — ABNORMAL HIGH (ref 0.44–1.00)
GFR calc Af Amer: 3 mL/min — ABNORMAL LOW (ref >60)
GFR calc non Af Amer: 3 mL/min — ABNORMAL LOW (ref >60)
Glucose, Bld: 154 mg/dL — ABNORMAL HIGH (ref 70–99)
Phosphorus: 11.5 mg/dL — ABNORMAL HIGH (ref 2.5–4.6)
Potassium: 3.5 mmol/L (ref 3.5–5.1)
Sodium: 142 mmol/L (ref 135–145)

## 2019-11-15 LAB — GLUCOSE, CAPILLARY: Glucose-Capillary: 137 mg/dL — ABNORMAL HIGH (ref 70–99)

## 2019-11-15 MED ORDER — AMLODIPINE BESYLATE 10 MG PO TABS: ORAL_TABLET | ORAL | 0 refills | Status: AC

## 2019-11-15 MED ORDER — AMLODIPINE BESYLATE 10 MG PO TABS: 10.0000 mg | ORAL_TABLET | Freq: Every day | ORAL | 0 refills | Status: DC

## 2019-11-15 MED FILL — AMLODIPINE BESYLATE 10 MG T: 10 | 30 days supply | Qty: 30 | Fill #0

## 2019-11-15 NOTE — Discharge Summary (Signed)
Name: Tara Mills MRN: [id number] DOB: [birth year] 24 y.o. PCP: [facility]  Date of Admission: 11/14/2019 12:07 AM Date of Discharge:  Attending Physician: Velna Ochs, MD  Discharge Diagnosis: 1. Acute Hypoxic Respiratory Failure  2. ESRD on PD 3. Acute on Chronic Normocytic Anemia  4. Hypertension   Discharge Medications: Allergies as of 11/15/2019      Reactions   Nsaids Anaphylaxis, Other (See Comments)   Due to ESRD   Grapefruit Extract Other (See Comments)   Per pt: can't have because of medications   Other    Other reaction(s): Unknown      Medication List    TAKE these medications   amLODipine 10 MG tablet Commonly known as: NORVASC Take 1 tablet (10 mg total) by mouth daily. What changed: Another medication with the same name was added. Make sure you understand how and when to take each.   amLODipine 10 MG tablet Commonly known as: NORVASC Please take 1 tablet by mouth daily What changed: You were already taking a medication with the same name, and this prescription was added. Make sure you understand how and when to take each.   calcitRIOL 0.5 MCG capsule Commonly known as: ROCALTROL Take 1 capsule (0.5 mcg total) by mouth 3 (three) times a week. What changed: how much to take   cinacalcet 30 MG tablet Commonly known as: SENSIPAR Take 1 tablet (30 mg total) by mouth daily with breakfast. What changed: when to take this   Claritin 10 MG tablet Generic drug: loratadine Take 10 mg by mouth daily as needed for allergies.   diphenhydrAMINE 25 mg capsule Commonly known as: BENADRYL Take 25 mg by mouth as needed for allergies or sleep.   Dulcolax 5 MG EC tablet Generic drug: bisacodyl Take 5 mg by mouth at bedtime as needed for mild constipation.   famotidine 20 MG tablet Commonly known as: PEPCID Take 20 mg by mouth at bedtime.   gentamicin cream 0.1 % Commonly known as: GARAMYCIN Apply 1 application topically  daily.   isosorbide-hydrALAZINE 20-37.5 MG tablet Commonly known as: BIDIL Take 2 tablets by mouth 3 (three) times daily.   labetalol 200 MG tablet Commonly known as: NORMODYNE Take 1 tablet (200 mg total) by mouth 3 (three) times daily.   lactulose 10 GM/15ML solution Commonly known as: CHRONULAC Take 30 mLs by mouth as needed for mild constipation.   losartan 50 MG tablet Commonly known as: COZAAR Take 1 tablet (50 mg total) by mouth daily.   MiraLax 17 GM/SCOOP powder Generic drug: polyethylene glycol powder Take 1 Container by mouth as needed for mild constipation.   multivitamin Tabs tablet Take 1 tablet by mouth at bedtime.   mycophenolate 250 MG capsule Commonly known as: CELLCEPT Take 2 capsules (500 mg total) by mouth 2 (two) times daily.   Nac 600 600 MG Caps Generic drug: Acetylcysteine Take 600 mg by mouth daily as needed.   ramelteon 8 MG tablet Commonly known as: ROZEREM Take 1 tablet (8 mg total) by mouth at bedtime.   sevelamer carbonate 800 MG tablet Commonly known as: RENVELA Take 1,600 mg by mouth 3 (three) times daily with meals.   tacrolimus 1 MG capsule Commonly known as: PROGRAF Take 3 mg by mouth 2 (two) times daily. 3 tabs in the AM and 3 tabs in the evening       Disposition and follow-up:   [name] was discharged from [facility] in Good condition.  At the hospital follow up visit please address:  1.    - Please ensure follow up with Nephrology as PD solution adjusted during admission  - Patient had hyperglycemia in current and past admissions to [facility]. There is dextrose in her PD fluid and A1c is unreliable. Please follow up CBGs to determine whether or not diabetic.   - Hypertension uncontrolled on admission. Suspect compliance issues   2.  Labs / imaging needed at time of follow-up: CBC, BMP  3.  Pending labs/ test needing follow-up: None   Follow-up Appointments: Follow-up Information     [facility] [place]. Schedule an appointment as soon as possible for a visit in 1 week(s).   Contact information: [place] [id number] [phone]          Hospital Course by problem list: 1. Acute Hypoxic Respiratory Failure  Patient presented with shortness of breath and orthopnea, that is felt to be likely secondary to inadequate volume removal with PD.  Initially started on 2 L of nasal cannula, however she was able to be weaned off supplemental oxygen over 24 hours and tolerated room air without difficulty on discharge.  2. ESRD on PD Nephrology was consulted on admission and are recommending transition to HD however patient declined at this time.  PD solution was changed to half 2.5% and half 4.25%, in addition to the use of diuretics, torsemide 100 mg once.  With this adjustment, patient's volume status improved significantly and she was able to be weaned off oxygen.  Recommend close follow-up outpatient with nephrology.  3. Acute on Chronic Normocytic Anemia  Hemoglobin on admission was 5.9, an acute drop one 1 month ago at which time it was 7.6.  This may be contributing to her acute hypoxic respiratory failure.  She has had similar drops in the past and has followed up with Dr. Harlow Asa, outpatient hematology.  While here, she was transfused 2 units of packed red blood cells and responded appropriately.  Recommend close follow-up with hematology.  4. Hypertension Blood pressure of 176/136 on admission.  She was restarted on her home medications that include amlodipine, BiDil, labetalol, and losartan.  Likely increased blood pressure secondary to medication noncompliance.  Blood pressure on discharge 125/89.  Discharge Vitals:   BP 125/89 (BP Location: Left Arm)   Pulse 98   Temp 97.7 F (36.5 C) (Oral)   Resp 16   Ht 5' (1.524 m)   Wt 59.1 kg   LMP 11/13/2019   SpO2 97%   BMI 25.43 kg/m   Pertinent Labs, Studies, and Procedures:  CBC  Latest Ref Rng & Units 11/15/2019 11/14/2019 11/14/2019  WBC 4.0 - 10.5 K/uL 5.7 - 6.3  Hemoglobin 12.0 - 15.0 g/dL 8.6(L) 8.4(L) 5.9(LL)  Hematocrit 36.0 - 46.0 % 26.4(L) 25.3(L) 18.7(L)  Platelets 150 - 400 K/uL 196 - 172   BMP Latest Ref Rng & Units 11/15/2019 11/14/2019 10/26/2019  Glucose 70 - 99 mg/dL 154(H) 111(H) 201(H)  BUN 6 - 20 mg/dL 58(H) 68(H) 64(H)  Creatinine 0.44 - 1.00 mg/dL 16.70(H) 19.33(H) 9.41(H)  Sodium 135 - 145 mmol/L 142 140 135  Potassium 3.5 - 5.1 mmol/L 3.5 4.2 3.9  Chloride 98 - 111 mmol/L 98 100 97(L)  CO2 22 - 32 mmol/L 21(L) 19(L) 20(L)  Calcium 8.9 - 10.3 mg/dL 8.5(L) 8.0(L) 7.7(L)   Imaging:  CXR (11/21/2019)  IMPRESSION: 1. Interval removal of the previously demonstrated tunneled dialysis catheter. 2. Cardiomegaly with interstitial  edema. The patient's cardiac silhouette has increased across multiple prior studies dating back to 2020. An underlying pericardial effusion is not excluded and can be correlated with an echocardiogram as clinically indicated.  Abdominal Xray (11/21/2019)  IMPRESSION: Nonobstructed bowel gas pattern  Discharge Instructions: Discharge Instructions    Call MD for:  difficulty breathing, headache or visual disturbances   Complete by: As directed    Call MD for:  extreme fatigue   Complete by: As directed    Call MD for:  persistant dizziness or light-headedness   Complete by: As directed    Call MD for:  persistant nausea and vomiting   Complete by: As directed    Call MD for:  redness, tenderness, or signs of infection (pain, swelling, redness, odor or green/yellow discharge around incision site)   Complete by: As directed    Call MD for:  temperature >100.4   Complete by: As directed    Diet - low sodium heart healthy   Complete by: As directed    Discharge instructions   Complete by: As directed    Ms. Cherron, Blitzer were admitted to the hospital due to volume overload that led to difficulty breathing.  Please follow up with your PD nurse and Nephrologist for a hospital follow up visit.   It was a pleasure meeting you!   Increase activity slowly   Complete by: As directed       Signed: Dr. Jose Persia Internal Medicine PGY-1  Pager: [phone] 11/15/2019, 1:26 PM

## 2019-11-15 NOTE — Progress Notes (Signed)
[facility] Progress Note  Subjective: 1900 cc UF yest afternoon and 4.3 L UF overnight  Vitals:   11/15/19 0626 11/15/19 0931 11/15/19 1011 11/15/19 1206  BP:  (!) 154/108 (!) 166/114 125/89  Pulse:  78 84 98  Resp:  _0 Temp:  98.2 F (36.8 C)  97.7 F (36.5 C)  TempSrc:  Oral  Oral  SpO2:  97%  97%  Weight: 59.1 kg     Height:        Exam: Alert, no distress, no O2  No jvd  Chest cta bilat   Cor reg    Abd soft ntnd   Ext no edema   NF alert   [place] Nephrologist: Dr. Johnney Ou CAPD, 4 exchanged/6 hour dwells, all 2.5% per pt, EDW 57kg > see new Rx upon d/c as below Calcitriol 0.75 mcg 3x week Aranesp 200 mcg SubQ every 2 weeks- last dose 09/25/19 Velphoro 551m 2 tabs PO TID Sensipar 378mQD  BP meds: Bidil 20-37.57m51m tabs TID, labetalol 200m67mD, losartan 50mg16m amlodipine 10mg 357mAssessment/Plan: 1.  SOB/ pulm edema/ vol overload - very large UF yest w/ 1.8 L net neg in the afternoon and 4 L net UF overnight, now is euvolemic and off O2.  May be that regular 4-6 hr CAPD causing fluid retention if she is a fast transporter, which is appears she prob is given the UF here.  Have d/w Dr KruskaJohnney OuD staff in [place] do short exchanges 2 hrs each 4 times during the day , and one dwell overnight, total 5 exchanges/ 24 hrs. Will lower fill vol to 1800 cc (pt will approximate) given issues w/ feeling overful w/ 2000 cc exchanges. Her next clearance will be done in April.  OK for dc today.  2.  ESRD:  no issues w/ fills/ drains here, no abd pain. KUB shows cath tip in the pelvis and no sig stool burden. See above #1 for new Rx upon discharge.  3.  Anemia: Presented with Hgb 5.9, pt is menstruating. Getting max ESA as outpt. Pt follows with hematology outpatient- had initial consult on 10/30/19 with Dr. Jason Jacqulyn Duckingern for shortened RBC survival and recommended continued ESA with possible bone marrow biopsy in the future. Hb 8.6  here after 2u prbcs. Will cont esa in OP setting.  4.  Metabolic bone disease: Calcium controlled. Outpatient PTH and phos elevated. Resume calcitriol, sensipar, and velphoro. Reportedly scheduled for parathyroidectomy next month.  5.  Nutrition:  Renal diet/fluid restrictions 6. Hx failed transplant: Continue cell cept and prograf.     Rob Jaye Saal 11/15/2019, 12:22 PM   Recent Labs  Lab 11/14/19 0022 11/14/19 0022 11/14/19 2101 11/15/19 0627  K 4.2  --   --  3.5  BUN 68*  --   --  58*  CREATININE 19.33*  --   --  16.70*  CALCIUM 8.0*  --   --  8.5*  PHOS  --   --   --  11.5*  HGB 5.9*   < > 8.4* 8.6*   < > = values in this interval not displayed.   Inpatient medications: . amLODipine  10 mg Oral Daily  . calcitRIOL  0.75 mcg Oral Once per day on Mon Wed Fri  . cinacalcet  30 mg Oral Q breakfast  . famotidine  20 mg Oral QHS  . gentamicin cream  1 application Topical Daily  . heparin  5,000  Units Subcutaneous Q8H  . isosorbide-hydrALAZINE  2 tablet Oral TID  . labetalol  200 mg Oral Q8H  . losartan  50 mg Oral Daily  . multivitamin  1 tablet Oral QHS  . mycophenolate  500 mg Oral BID  . ramelteon  8 mg Oral QHS  . sucroferric oxyhydroxide  1,000 mg Oral TID WC  . tacrolimus  2 mg Oral BID  . torsemide  100 mg Oral Daily   . sodium chloride    . dialysis solution 2.5% low-MG/low-CA    . dialysis solution 4.25% low-MG/low-CA     acetaminophen **OR** acetaminophen, guaiFENesin-codeine, dianeal solution for CAPD/CCPD with heparin, dianeal solution for CAPD/CCPD with heparin, ondansetron **OR** ondansetron (ZOFRAN) IV

## 2019-11-15 NOTE — Progress Notes (Signed)
Pharmacy called RN concerning her refusal of heparin SQ; RN educated Pt on the importance of the heparin. Pt voices understanding but stated "I don't need that stuff, I know my body". Pt refuse the heparin and don't want the injection. Delia Heady RN

## 2019-11-15 NOTE — Progress Notes (Signed)
Subjective:   Tara Mills state she is feeling better this AM. She is no longer requiring oxygen and feels less swollen. She feels her volume overload may have been from using PD incorrectly.   We discussed the blood sugars being elevated today. She notes that dialysis solution has dextrose in it and previous elevations were when she was taking steroids. She is in agreement to continue to monitor though.   Objective:  Vital signs in last 24 hours: Vitals:   11/14/19 2135 11/15/19 0059 11/15/19 0503 11/15/19 0626  BP: (!) 166/115 (!) 140/98 (!) 145/104   Pulse:  81 74   Resp:  20 16   Temp:  97.7 F (36.5 C) 97.7 F (36.5 C)   TempSrc:  Oral Oral   SpO2:  100% 100%   Weight:    59.1 kg  Height:        Physical Exam Vitals and nursing note reviewed.  Constitutional:      General: She is not in acute distress.    Appearance: She is normal weight.  Cardiovascular:     Rate and Rhythm: Normal rate and regular rhythm.     Heart sounds: No murmur.  Pulmonary:     Effort: Pulmonary effort is normal. No respiratory distress.     Breath sounds: Normal breath sounds. No wheezing or rales.  Musculoskeletal:     Right lower leg: No edema.     Left lower leg: No edema.  Skin:    General: Skin is warm and dry.  Neurological:     General: No focal deficit present.     Mental Status: She is alert and oriented to person, place, and time. Mental status is at baseline.  Psychiatric:        Mood and Affect: Mood normal.        Behavior: Behavior normal.    Assessment/Plan:  Active Problems:   Acute on chronic anemia  Tara Mills is a 24 y.o female with withcongenital renal dysplasia of the left kidney withESRD on peritoneal dialysis s/p failed renal transplant, anemia of chronic diseaseon Aranesp, HTN, and HFpEF who presented to the ED withprogressive cough and SOB secondary to volume overload.  # Acute Hypoxic Respiratory Failure 2/2 Volume Overload    Resolved.  - Continuous pulse oximetry  # ESRD on PD # Congenital Renal Dysplasia (L) with (R) renal transplant c/b graft rejection  # Secondary Hyperparathyroidism Tolerated new dialysis solution without difficulty.   - Nephrology following, we appreciate their recommendations  - Dialysis solution (1/2) 2.5% with (1/2) 4.25% added, per Nephro - Torsemide 100 mg  - Mycophenolate 500 mg BID - Tacrolimus 2 mg BID (home dose is 3 mg BID) - Calcitriol 0.5 mg 3x per week - Cinacalcet 30 mg QD with breakfast  # Acute on Chronic Normocytic Anemia  Hemoglobin stabilized at 8.6 this AM. Continue to monitor while admitted.   - CBC daily while admitted - Continue Aransep   # Hypertension  Improving.   - Amlodipine 10 mg QD - BiDil 40-75mg  BID - Labetalol 200 mg TID - Losartan 50 mg QD  # Elevated POC beta-hCG Negative pregnancy.   # Glucosuria  # Hyperglycemia Hypergylcemic this AM, however patient notes that her dialysis solution has dextrose in it. Additionally, she's had rndom BGs over 300, but patient states those were when she was taking Prednisone. Unclear if patient is diabetic but it is certainly possible. Will continue to monitor CBGs while admitted.   - CBG  monitoring   Dispo: Anticipated discharge per Nephrology recommendations   Dr. Jose Persia Internal Medicine PGY-1  Pager: [phone] 11/15/2019, 7:07 AM

## 2019-11-15 NOTE — Progress Notes (Signed)
Pt discharge education and instructions completed with pt and fiance at bedside; both voices understanding and denied any questions. Pt IV removed; pt discharge home with fiance to transport her home. Pt Norvasc delivered to bedside by pharmacy. Pt offered wheelchair but she declined and ambulated off unit with fiance and belongings to the side. Delia Heady RN

## 2019-11-19 ENCOUNTER — Ambulatory Visit: Payer: Medicaid Other | Admitting: Neurology

## 2019-11-25 ENCOUNTER — Encounter: Payer: Self-pay | Admitting: Neurology

## 2019-11-27 ENCOUNTER — Emergency Department (HOSPITAL_COMMUNITY): Payer: Medicaid Other

## 2019-11-27 ENCOUNTER — Encounter (HOSPITAL_COMMUNITY): Payer: Self-pay | Admitting: Emergency Medicine

## 2019-11-27 ENCOUNTER — Other Ambulatory Visit: Payer: Self-pay

## 2019-11-27 ENCOUNTER — Inpatient Hospital Stay (HOSPITAL_COMMUNITY)
Admission: EM | Admit: 2019-11-27 | Discharge: 2019-11-28 | DRG: 291 | Discharge status: Against Medical Advice | Payer: Medicaid Other | Attending: Internal Medicine | Admitting: Internal Medicine

## 2019-11-27 DIAGNOSIS — Z9889 Other specified postprocedural states: Secondary | ICD-10-CM | POA: Diagnosis not present

## 2019-11-27 DIAGNOSIS — Z79899 Other long term (current) drug therapy: Secondary | ICD-10-CM | POA: Diagnosis not present

## 2019-11-27 DIAGNOSIS — I132 Hypertensive heart and chronic kidney disease with heart failure and with stage 5 chronic kidney disease, or end stage renal disease: Principal | ICD-10-CM | POA: Diagnosis present

## 2019-11-27 DIAGNOSIS — D619 Aplastic anemia, unspecified: Secondary | ICD-10-CM | POA: Diagnosis not present

## 2019-11-27 DIAGNOSIS — D631 Anemia in chronic kidney disease: Secondary | ICD-10-CM | POA: Diagnosis present

## 2019-11-27 DIAGNOSIS — D649 Anemia, unspecified: Secondary | ICD-10-CM | POA: Diagnosis not present

## 2019-11-27 DIAGNOSIS — R0600 Dyspnea, unspecified: Secondary | ICD-10-CM

## 2019-11-27 DIAGNOSIS — N186 End stage renal disease: Secondary | ICD-10-CM | POA: Diagnosis present

## 2019-11-27 DIAGNOSIS — R630 Anorexia: Secondary | ICD-10-CM | POA: Diagnosis present

## 2019-11-27 DIAGNOSIS — R0609 Other forms of dyspnea: Secondary | ICD-10-CM

## 2019-11-27 DIAGNOSIS — T8612 Kidney transplant failure: Secondary | ICD-10-CM | POA: Diagnosis not present

## 2019-11-27 DIAGNOSIS — Z20822 Contact with and (suspected) exposure to covid-19: Secondary | ICD-10-CM | POA: Diagnosis present

## 2019-11-27 DIAGNOSIS — D696 Thrombocytopenia, unspecified: Secondary | ICD-10-CM | POA: Diagnosis present

## 2019-11-27 DIAGNOSIS — M898X9 Other specified disorders of bone, unspecified site: Secondary | ICD-10-CM | POA: Diagnosis present

## 2019-11-27 DIAGNOSIS — N2581 Secondary hyperparathyroidism of renal origin: Secondary | ICD-10-CM | POA: Diagnosis present

## 2019-11-27 DIAGNOSIS — E8770 Fluid overload, unspecified: Secondary | ICD-10-CM | POA: Diagnosis present

## 2019-11-27 DIAGNOSIS — Z992 Dependence on renal dialysis: Secondary | ICD-10-CM | POA: Diagnosis not present

## 2019-11-27 DIAGNOSIS — I10 Essential (primary) hypertension: Secondary | ICD-10-CM

## 2019-11-27 DIAGNOSIS — Z5329 Procedure and treatment not carried out because of patient's decision for other reasons: Secondary | ICD-10-CM | POA: Diagnosis not present

## 2019-11-27 DIAGNOSIS — D691 Qualitative platelet defects: Secondary | ICD-10-CM

## 2019-11-27 DIAGNOSIS — Z886 Allergy status to analgesic agent status: Secondary | ICD-10-CM

## 2019-11-27 DIAGNOSIS — I5030 Unspecified diastolic (congestive) heart failure: Secondary | ICD-10-CM | POA: Diagnosis present

## 2019-11-27 DIAGNOSIS — Z91018 Allergy to other foods: Secondary | ICD-10-CM

## 2019-11-27 DIAGNOSIS — R011 Cardiac murmur, unspecified: Secondary | ICD-10-CM | POA: Diagnosis not present

## 2019-11-27 LAB — IRON AND TIBC
Iron: 164 ug/dL (ref 28–170)
Saturation Ratios: 92 % — ABNORMAL HIGH (ref 10.4–31.8)
TIBC: 178 ug/dL — ABNORMAL LOW (ref 250–450)
UIBC: 14 ug/dL

## 2019-11-27 LAB — CBC WITH DIFFERENTIAL/PLATELET
Abs Immature Granulocytes: 0.04 10*3/uL (ref 0.00–0.07)
Basophils Absolute: 0.1 10*3/uL (ref 0.0–0.1)
Basophils Relative: 1 %
Eosinophils Absolute: 0.1 10*3/uL (ref 0.0–0.5)
Eosinophils Relative: 2 %
HCT: 18.7 % — ABNORMAL LOW (ref 36.0–46.0)
Hemoglobin: 6.1 g/dL — CL (ref 12.0–15.0)
Immature Granulocytes: 1 %
Lymphocytes Relative: 12 %
Lymphs Abs: 0.9 10*3/uL (ref 0.7–4.0)
MCH: 31.1 pg (ref 26.0–34.0)
MCHC: 32.6 g/dL (ref 30.0–36.0)
MCV: 95.4 fL (ref 80.0–100.0)
Monocytes Absolute: 0.3 10*3/uL (ref 0.1–1.0)
Monocytes Relative: 4 %
Neutro Abs: 5.8 10*3/uL (ref 1.7–7.7)
Neutrophils Relative %: 80 %
Platelets: 96 10*3/uL — ABNORMAL LOW (ref 150–400)
RBC: 1.96 MIL/uL — ABNORMAL LOW (ref 3.87–5.11)
RDW: 15.8 % — ABNORMAL HIGH (ref 11.5–15.5)
WBC: 7.3 10*3/uL (ref 4.0–10.5)
nRBC: 0 % (ref 0.0–0.2)

## 2019-11-27 LAB — BASIC METABOLIC PANEL
Anion gap: 19 — ABNORMAL HIGH (ref 5–15)
BUN: 109 mg/dL — ABNORMAL HIGH (ref 6–20)
CO2: 18 mmol/L — ABNORMAL LOW (ref 22–32)
Calcium: 7.6 mg/dL — ABNORMAL LOW (ref 8.9–10.3)
Chloride: 102 mmol/L (ref 98–111)
Creatinine, Ser: 24.28 mg/dL — ABNORMAL HIGH (ref 0.44–1.00)
GFR calc Af Amer: 2 mL/min — ABNORMAL LOW (ref >60)
GFR calc non Af Amer: 2 mL/min — ABNORMAL LOW (ref >60)
Glucose, Bld: 96 mg/dL (ref 70–99)
Potassium: 3.9 mmol/L (ref 3.5–5.1)
Sodium: 139 mmol/L (ref 135–145)

## 2019-11-27 LAB — SAVE SMEAR(SSMR), FOR PROVIDER SLIDE REVIEW

## 2019-11-27 LAB — TROPONIN I (HIGH SENSITIVITY)
Troponin I (High Sensitivity): 60 ng/L — ABNORMAL HIGH (ref <18)
Troponin I (High Sensitivity): 54 ng/L — ABNORMAL HIGH (ref <18)

## 2019-11-27 LAB — PHOSPHORUS: Phosphorus: >30 mg/dL — ABNORMAL HIGH (ref 2.5–4.6)

## 2019-11-27 LAB — I-STAT BETA HCG BLOOD, ED (MC, WL, AP ONLY): I-stat hCG, quantitative: <5 m[IU]/mL (ref <5)

## 2019-11-27 LAB — VITAMIN B12: Vitamin B-12: 355 pg/mL (ref 180–914)

## 2019-11-27 LAB — PREPARE RBC (CROSSMATCH)

## 2019-11-27 LAB — FERRITIN: Ferritin: 1124 ng/mL — ABNORMAL HIGH (ref 11–307)

## 2019-11-27 LAB — MAGNESIUM: Magnesium: 2.2 mg/dL (ref 1.7–2.4)

## 2019-11-27 LAB — SARS CORONAVIRUS 2 (TAT 6-24 HRS): SARS Coronavirus 2: NEGATIVE

## 2019-11-27 MED ORDER — DARBEPOETIN ALFA 200 MCG/0.4ML IJ SOSY
200.0000 ug | PREFILLED_SYRINGE | INTRAMUSCULAR | Status: DC
  Administered 2019-11-27: 200 ug via SUBCUTANEOUS
  Filled 2019-11-27 (×2): qty 0.4

## 2019-11-27 MED ORDER — CINACALCET HCL 30 MG PO TABS
30.0000 mg | ORAL_TABLET | Freq: Every day | ORAL | Status: DC
  Administered 2019-11-27: 30 mg via ORAL
  Filled 2019-11-27 (×2): qty 1

## 2019-11-27 MED ORDER — TACROLIMUS 1 MG PO CAPS
3.0000 mg | ORAL_CAPSULE | Freq: Two times a day (BID) | ORAL | Status: DC
  Administered 2019-11-28: 3 mg via ORAL
  Filled 2019-11-27 (×2): qty 3

## 2019-11-27 MED ORDER — ALBUTEROL SULFATE HFA 108 (90 BASE) MCG/ACT IN AERS
2.0000 | INHALATION_SPRAY | Freq: Once | RESPIRATORY_TRACT | Status: AC
  Administered 2019-11-27: 2 via RESPIRATORY_TRACT
  Filled 2019-11-27: qty 6.7

## 2019-11-27 MED ORDER — CALCITRIOL 0.25 MCG PO CAPS
0.5000 ug | ORAL_CAPSULE | Freq: Every day | ORAL | Status: DC
  Administered 2019-11-27 – 2019-11-28 (×2): 0.5 ug via ORAL
  Filled 2019-11-27: qty 1
  Filled 2019-11-27: qty 2

## 2019-11-27 MED ORDER — RENA-VITE PO TABS
1.0000 | ORAL_TABLET | Freq: Every day | ORAL | Status: DC
  Administered 2019-11-27: 1 via ORAL
  Filled 2019-11-27 (×2): qty 1

## 2019-11-27 MED ORDER — ISOSORB DINITRATE-HYDRALAZINE 20-37.5 MG PO TABS
2.0000 | ORAL_TABLET | Freq: Three times a day (TID) | ORAL | Status: DC
  Administered 2019-11-27 – 2019-11-28 (×3): 2 via ORAL
  Filled 2019-11-27 (×5): qty 2

## 2019-11-27 MED ORDER — SUCROFERRIC OXYHYDROXIDE 500 MG PO CHEW
1000.0000 mg | CHEWABLE_TABLET | Freq: Three times a day (TID) | ORAL | Status: DC
  Administered 2019-11-27 – 2019-11-28 (×2): 1000 mg via ORAL
  Filled 2019-11-27 (×5): qty 2

## 2019-11-27 MED ORDER — AMLODIPINE BESYLATE 5 MG PO TABS
10.0000 mg | ORAL_TABLET | Freq: Once | ORAL | Status: AC
  Administered 2019-11-27: 10 mg via ORAL
  Filled 2019-11-27: qty 2

## 2019-11-27 MED ORDER — LABETALOL HCL 200 MG PO TABS
200.0000 mg | ORAL_TABLET | Freq: Once | ORAL | Status: AC
  Administered 2019-11-27: 200 mg via ORAL
  Filled 2019-11-27: qty 1

## 2019-11-27 MED ORDER — DELFLEX-LC/4.25% DEXTROSE 483 MOSM/L IP SOLN: Freq: Every day | INTRAPERITONEAL | Status: DC

## 2019-11-27 MED ORDER — MYCOPHENOLATE MOFETIL 250 MG PO CAPS
1000.0000 mg | ORAL_CAPSULE | Freq: Two times a day (BID) | ORAL | Status: DC
  Administered 2019-11-28: 1000 mg via ORAL
  Filled 2019-11-27 (×2): qty 4

## 2019-11-27 MED ORDER — LABETALOL HCL 5 MG/ML IV SOLN
20.0000 mg | Freq: Once | INTRAVENOUS | Status: DC
  Filled 2019-11-27: qty 4

## 2019-11-27 MED ORDER — LABETALOL HCL 200 MG PO TABS
200.0000 mg | ORAL_TABLET | Freq: Three times a day (TID) | ORAL | Status: DC
  Administered 2019-11-27 – 2019-11-28 (×3): 200 mg via ORAL
  Filled 2019-11-27 (×3): qty 1

## 2019-11-27 MED ORDER — AEROCHAMBER PLUS FLO-VU LARGE MISC
1.0000 | Freq: Once | Status: AC
  Administered 2019-11-27: 1

## 2019-11-27 MED ORDER — LOSARTAN POTASSIUM 50 MG PO TABS
50.0000 mg | ORAL_TABLET | Freq: Every day | ORAL | Status: DC
  Administered 2019-11-28: 50 mg via ORAL
  Filled 2019-11-27: qty 1

## 2019-11-27 MED ORDER — DELFLEX-LC/2.5% DEXTROSE 394 MOSM/L IP SOLN: Freq: Every day | INTRAPERITONEAL | Status: DC

## 2019-11-27 MED ORDER — SODIUM CHLORIDE 0.9% IV SOLUTION: Freq: Once | INTRAVENOUS | Status: AC

## 2019-11-27 MED ORDER — GENTAMICIN SULFATE 0.1 % EX CREA
1.0000 "application " | TOPICAL_CREAM | Freq: Every day | CUTANEOUS | Status: DC
  Filled 2019-11-27: qty 15

## 2019-11-27 MED ORDER — LOSARTAN POTASSIUM 50 MG PO TABS
50.0000 mg | ORAL_TABLET | Freq: Once | ORAL | Status: AC
  Administered 2019-11-27: 50 mg via ORAL
  Filled 2019-11-27: qty 1

## 2019-11-27 MED ORDER — AMLODIPINE BESYLATE 10 MG PO TABS
10.0000 mg | ORAL_TABLET | Freq: Every day | ORAL | Status: DC
  Administered 2019-11-28: 10 mg via ORAL
  Filled 2019-11-27: qty 1

## 2019-11-27 MED ORDER — SEVELAMER CARBONATE 800 MG PO TABS: 1600.0000 mg | ORAL_TABLET | Freq: Three times a day (TID) | ORAL | Status: DC

## 2019-11-27 NOTE — Consult Note (Addendum)
[facility] Renal Consultation Note    Indication for Consultation:  Management of ESRD/hemodialysis; anemia, hypertension/volume and secondary hyperparathyroidism PCP: [place]  HPI: Tara Mills is a 24 y.o. 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  She is convinced that her body cannot tolerate HD and explains that her abnormal labs are related to "that's what happens whenever I need blood.".  She does for daytime exchanges of 1.8 L usually 2.5% with 2 hr dwells and a 2 L 6 hr night time dwell that she didn't do last night.  She moved to [facility] for "better medical care and chance for a transplant"  She denies any abdominal pain, bad taste in her mouth , N or V but has anorexia and only eats about once a day and is not taking binders.  She has SOB, fatigue, cough no CP but worsening anxiety.  She is not connected with a transplant center in [place]. She is in need of a parathyroidectomy  (iPTH 2200) before she can get another kidney transplant by her report. This is her 3rd admission here this year.  She did received HD during her February admission for HTN with papilledema and was poorly tolerant despite double rinsing of the kidney.  Labs drawn in the ED show hgb 6.1 down from 8.4 3/18 at her dialysis unit.  Cr 24 BUN 109 K 3.9 CO2 18 glu 96 Ca 7.6 alb not done P > 30 hgb 6.1 plts 96  WBC 7.3 80% N. CXR suggestive of interstitial edema with persistent CM suggestive of pericardial fluid. Most recent Echo was 10/2019 that confirmed a small circumferential pericardial effusion without evidence of tamponade.  She is afebrile with elevated BPs.  Past Medical History:  Diagnosis Date  . Anemia   . Hypertension   . Renal disorder    Past Surgical History:  Procedure Laterality Date  . NEPHRECTOMY TRANSPLANTED ORGAN     2007   No family  history on file. Social History:  reports that she has never smoked. She has never used smokeless tobacco. She reports previous alcohol use. She reports that she does not use drugs. Allergies  Allergen Reactions  . Nsaids Anaphylaxis and Other (See Comments)    Due to ESRD   . Grapefruit Extract Other (See Comments)    Per pt: can't have because of medications  . Other     Other reaction(s): Unknown   Prior to Admission medications   Medication Sig Start Date End Date Taking? Authorizing Provider  amLODipine (NORVASC) 10 MG tablet Please take 1 tablet by mouth daily 11/15/19  Yes Jose Persia, MD  calcitRIOL (ROCALTROL) 0.5 MCG capsule Take 1 capsule (0.5 mcg total) by mouth 3 (three) times a week. Patient taking differently: Take 1.5 mcg by mouth 3 (three) times a week.  10/28/19 11/27/19 Yes Aslam, Loralyn Freshwater, MD  CLARITIN 10 MG tablet Take 10 mg by mouth daily as needed for allergies.  09/18/19  Yes [provider]  diphenhydrAMINE (BENADRYL) 25 mg capsule Take 25 mg by mouth as needed for allergies or sleep.   Yes [provider]  DULCOLAX 5 MG EC tablet Take 5 mg by mouth at bedtime as needed for mild constipation.  10/08/19  Yes [provider]  famotidine (PEPCID) 20 MG tablet Take 20 mg by mouth at bedtime.  Yes [provider]  gentamicin cream (GARAMYCIN) 0.1 % Apply 1 application topically daily.  09/03/19  Yes [provider]  isosorbide-hydrALAZINE (BIDIL) 20-37.5 MG tablet Take 2 tablets by mouth 3 (three) times daily. 10/26/19  Yes [provider]  labetalol (NORMODYNE) 200 MG tablet Take 1 tablet (200 mg total) by mouth 3 (three) times daily. 10/26/19 11/27/19 Yes Aslam, Loralyn Freshwater, MD  lactulose (CHRONULAC) 10 GM/15ML solution Take 30 mLs by mouth as needed for mild constipation.  10/08/19  Yes [provider]  losartan (COZAAR) 50 MG tablet Take 1 tablet (50 mg total) by mouth daily. 10/26/19 11/27/19 Yes Aslam, Loralyn Freshwater, MD   MIRALAX 17 GM/SCOOP powder Take 1 Container by mouth as needed for mild constipation.  10/08/19  Yes [provider]  mycophenolate (CELLCEPT) 500 MG tablet Take 1,000 mg by mouth 2 (two) times daily. 03/21/19  Yes [provider]  NAC 600 600 MG CAPS Take 600 mg by mouth daily as needed.  09/18/19  Yes [provider]  sevelamer carbonate (RENVELA) 800 MG tablet Take 1,600 mg by mouth 3 (three) times daily with meals.   Yes [provider]  tacrolimus (PROGRAF) 1 MG capsule Take 3 mg by mouth 2 (two) times daily. 3 tabs in the AM and 3 tabs in the evening 09/04/19  Yes [provider]  amLODipine (NORVASC) 10 MG tablet Take 1 tablet (10 mg total) by mouth daily. Patient not taking: Reported on 11/27/2019 11/15/19 02/13/20  Jose Persia, MD  ramelteon (ROZEREM) 8 MG tablet Take 1 tablet (8 mg total) by mouth at bedtime. 10/26/19 11/25/19  [name], MD   No current facility-administered medications for this encounter.   Current Outpatient Medications  Medication Sig Dispense Refill  . amLODipine (NORVASC) 10 MG tablet Please take 1 tablet by mouth daily 10 tablet 0  . calcitRIOL (ROCALTROL) 0.5 MCG capsule Take 1 capsule (0.5 mcg total) by mouth 3 (three) times a week. (Patient taking differently: Take 1.5 mcg by mouth 3 (three) times a week. ) 12 capsule 0  . CLARITIN 10 MG tablet Take 10 mg by mouth daily as needed for allergies.     . diphenhydrAMINE (BENADRYL) 25 mg capsule Take 25 mg by mouth as needed for allergies or sleep.    . DULCOLAX 5 MG EC tablet Take 5 mg by mouth at bedtime as needed for mild constipation.     . famotidine (PEPCID) 20 MG tablet Take 20 mg by mouth at bedtime.    Marland Kitchen gentamicin cream (GARAMYCIN) 0.1 % Apply 1 application topically daily.     . isosorbide-hydrALAZINE (BIDIL) 20-37.5 MG tablet Take 2 tablets by mouth 3 (three) times daily.    Marland Kitchen labetalol (NORMODYNE) 200 MG tablet Take 1 tablet (200 mg total) by mouth 3  (three) times daily. 90 tablet 0  . lactulose (CHRONULAC) 10 GM/15ML solution Take 30 mLs by mouth as needed for mild constipation.     Marland Kitchen losartan (COZAAR) 50 MG tablet Take 1 tablet (50 mg total) by mouth daily. 30 tablet 0  . MIRALAX 17 GM/SCOOP powder Take 1 Container by mouth as needed for mild constipation.     . mycophenolate (CELLCEPT) 500 MG tablet Take 1,000 mg by mouth 2 (two) times daily.    Marland Kitchen NAC 600 600 MG CAPS Take 600 mg by mouth daily as needed.     . sevelamer carbonate (RENVELA) 800 MG tablet Take 1,600 mg by mouth 3 (three) times daily with meals.    Marland Kitchen  tacrolimus (PROGRAF) 1 MG capsule Take 3 mg by mouth 2 (two) times daily. 3 tabs in the AM and 3 tabs in the evening    . amLODipine (NORVASC) 10 MG tablet Take 1 tablet (10 mg total) by mouth daily. (Patient not taking: Reported on 11/27/2019) 90 tablet 0  . ramelteon (ROZEREM) 8 MG tablet Take 1 tablet (8 mg total) by mouth at bedtime. 30 tablet 0   Labs: Basic Metabolic Panel: Recent Labs  Lab 11/27/19 0921  NA 139  K 3.9  CL 102  CO2 18*  GLUCOSE 96  BUN 109*  CREATININE 24.28*  CALCIUM 7.6*  PHOS >30.0*   Liver Function Tests: No results for input(s): AST, ALT, ALKPHOS, BILITOT, PROT, ALBUMIN in the last 168 hours. No results for input(s): LIPASE, AMYLASE in the last 168 hours. No results for input(s): AMMONIA in the last 168 hours. CBC: Recent Labs  Lab 11/27/19 0921  WBC 7.3  NEUTROABS 5.8  HGB 6.1*  HCT 18.7*  MCV 95.4  PLT 96*   Cardiac Enzymes: No results for input(s): CKTOTAL, CKMB, CKMBINDEX, TROPONINI in the last 168 hours. CBG: No results for input(s): GLUCAP in the last 168 hours. Iron Studies: No results for input(s): IRON, TIBC, TRANSFERRIN, FERRITIN in the last 72 hours. Studies/Results: DG Chest 2 View  Result Date: 11/27/2019 CLINICAL DATA:  Shortness of breath. EXAM: CHEST - 2 VIEW COMPARISON:  11/14/2019 FINDINGS: Cardiomediastinal contours remain enlarged with large mint of the  cardiac silhouette that again suggests pericardial fluid. Increased interstitial markings, increased since the prior exam. No sign of pleural effusion or consolidation. Visualized skeletal structures are unremarkable. IMPRESSION: Interval increase in interstitial markings suggesting interstitial edema, likely related to heart failure or volume overload. Persistent cardiomegaly with contours that again suggests pericardial fluid. Electronically Signed   By: Zetta Bills M.D.   On: 11/27/2019 09:37   ROS: As per HPI otherwise negative.  Physical Exam: Vitals:   11/27/19 1145 11/27/19 1200 11/27/19 1215 11/27/19 1220  BP: (!) 151/108 (!) 150/109 (!) 142/120 (!) 150/109  Pulse: 82 80 81 82  Resp: (!) 21 (!) 26 (!) 24 (!) 39  Temp: 97.6 F (36.4 C)   98 F (36.7 C)  TempSrc: Oral   Oral  SpO2: 100% 100% 97% 97%  Weight:      Height:         General: pale unwell appearing young female Head: NCAT sclera not icteric MMM Neck: Supple.  Lungs: Few crackles  Breathing is unlabored.wearing O2 Heart: RRR with S1 S2.  Abdomen: soft NT + BS Lower extremities:without edema or ischemic changes, no open wounds  Neuro: A & O  X 3. Moves all extremities spontaneously. Psych:  Responds to questions appropriately with a normal affect. Dialysis Access:PD cath without erythema or draiange  Dialysis Orders:  Nephrologist: Dr. Johnney Ou CAPD 7 days a week, 4 exchanged/2 hour dwells1.8 L , all 2.5% per pt, EDW 59kg (previously 57 kg) Calcitriol 0.75 mcg 3x week Aranesp 200 mcg SubQ every 2 weeks- due for dose 3/24 Velphoro 500mg  2 tabs PO TID Sensipar 30mg  QD  BP meds: Bidil 20-37.5mg  2 tabs TID ( on home med list but not last d/c summary), labetalol 200mg  TID, losartan 50mg  QD, amlodipine 10mg  QD  Assessment/Plan: 1. Acute on chronic anemia - not clear why hgb drop - last tsat 60%  Transfusing 1 unit PRBC today ; continue ESA - had heme consult 10/30/19 considering BM Bx in the future - 3/8 was  due for  Aranesp dose today - will change to 200 per week at discharge.  2. ESRD with failed tx 01/2019 uremic with mild metabolic acidosis -  On CAPD with night time dwell - will change to CCPD while here - she is agreeable.  Also revisited the suggestion of trying HD again but she is opposed.; continue tx meds. We will try CCPD again while here.  If not significant improvement REALLY needs to transition back to HD.  Have discussed with her primary nephrologist who agrees. Of note, her renal labs were dramatically better in February when she was receiving HD 3. Hypertension/volume  - on multiple meds - just refilled Rx: - continue home meds - try for UF during HD; given high BP and some excess volume will use half 4.25 and 2.5s tonight.   4. Metabolic bone disease - uncontrolled secondary hyperparathyroidism - continue sensipar/binders/calcitriol - favor daily dosing of calcitriol - not clear that she takes any of her meds at home.  Her labs would suggest she does not. Reportedly scheduled for parathyroidectomy next month 5. Nutrition - anorexia is likely in part due to uremia - needs low P diet, renavites   Myriam Jacobson, PA-C [place] [phone] 11/27/2019, 1:02 PM   Nephrology attending: Patient was seen and examined in ER.  Chart reviewed.  I agree with assessment and plan as outlined above.  24 year old female with history of failed kidney transplant, was on hemodialysis and then switched to PD, chronic anemia admitted with symptomatic anemia with hemoglobin of 6.1.  She has poor clearance with significant elevated BUN and creatinine level.  I recommend her to switch to hemodialysis per better clearance however patient is adamant to continue PD at this time.  We will do CCPD today. Noted patient received a unit of blood transfusion.  Continue ESA, check iron.  Recommend hematology consult while she is in the hospital.  Patient has missed multiple appointment for hematology as  outpatient.  Katheran James, MD [facility].

## 2019-11-27 NOTE — ED Provider Notes (Signed)
[facility] EMERGENCY DEPARTMENT Provider Note   CSN: [id number] Arrival date & time: 11/27/19  2979     History Chief Complaint  Patient presents with  . Anemia  . Shortness of Breath  . Fatigue    Tara Mills is a 24 y.o. female.  Pt presents to the ED today with sob.  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  She had a transplant in 2007 which has failed.  She was initially on HD.  She is now on PD.  The pt has chronic anemia on aranesp.  Transition back to HD was recommended on her last admission (3/11-12), but she refused.  She told me she does not want HD.  The pt said she feels like her hemoglobin is low.  Pt said she's been compliant with her PD and with her meds.  Her initial BP is 187/133.  She said she normally does not take her morning meds until 1100 and so she has not taken them yet today.  No f/c.  POC covid neg on 3/11.        Past Medical History:  Diagnosis Date  . Anemia   . Hypertension   . Renal disorder     Patient Active Problem List   Diagnosis Date Noted  . Acute on chronic anemia 11/14/2019  . 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 NEPHRECTOMY TRANSPLANTED ORGAN     2007     OB History   No obstetric history on file.     No family history on file.  Social History   Tobacco Use  . Smoking status: Never Smoker  . Smokeless tobacco: Never Used  Substance Use Topics  . Alcohol use: Not Currently  . Drug use: Never    Home Medications Prior to Admission medications   Medication Sig Start Date End Date Taking? Authorizing Provider  amLODipine (NORVASC) 10 MG tablet Please take 1 tablet by mouth daily 11/15/19  Yes Jose Persia, MD  calcitRIOL (ROCALTROL) 0.5 MCG capsule Take 1 capsule (0.5 mcg total) by mouth 3 (three) times a week. Patient taking differently: Take 1.5 mcg by mouth 3 (three) times a week.  10/28/19 11/27/19 Yes  Aslam, Loralyn Freshwater, MD  CLARITIN 10 MG tablet Take 10 mg by mouth daily as needed for allergies.  09/18/19  Yes [provider]  diphenhydrAMINE (BENADRYL) 25 mg capsule Take 25 mg by mouth as needed for allergies or sleep.   Yes [provider]  DULCOLAX 5 MG EC tablet Take 5 mg by mouth at bedtime as needed for mild constipation.  10/08/19  Yes [provider]  famotidine (PEPCID) 20 MG tablet Take 20 mg by mouth at bedtime.   Yes [provider]  gentamicin cream (GARAMYCIN) 0.1 % Apply 1 application topically daily.  09/03/19  Yes [provider]  isosorbide-hydrALAZINE (BIDIL) 20-37.5 MG tablet Take 2 tablets by mouth 3 (three) times daily. 10/26/19  Yes [provider]  labetalol (NORMODYNE) 200 MG tablet Take 1 tablet (200 mg total) by mouth 3 (three) times daily. 10/26/19 11/27/19 Yes Aslam, Loralyn Freshwater, MD  lactulose (CHRONULAC) 10 GM/15ML solution Take 30 mLs by mouth as needed for mild constipation.  10/08/19  Yes [provider]  losartan (COZAAR) 50 MG tablet Take 1 tablet (50 mg total) by mouth daily. 10/26/19 11/27/19 Yes Aslam, Loralyn Freshwater, MD  MIRALAX 17 GM/SCOOP powder Take 1 Container by mouth  as needed for mild constipation.  10/08/19  Yes [provider]  mycophenolate (CELLCEPT) 500 MG tablet Take 1,000 mg by mouth 2 (two) times daily. 03/21/19  Yes [provider]  NAC 600 600 MG CAPS Take 600 mg by mouth daily as needed.  09/18/19  Yes [provider]  sevelamer carbonate (RENVELA) 800 MG tablet Take 1,600 mg by mouth 3 (three) times daily with meals.   Yes [provider]  tacrolimus (PROGRAF) 1 MG capsule Take 3 mg by mouth 2 (two) times daily. 3 tabs in the AM and 3 tabs in the evening 09/04/19  Yes [provider]  amLODipine (NORVASC) 10 MG tablet Take 1 tablet (10 mg total) by mouth daily. Patient not taking: Reported on 11/27/2019 11/15/19 02/13/20  Jose Persia, MD  ramelteon (ROZEREM) 8 MG  tablet Take 1 tablet (8 mg total) by mouth at bedtime. 10/26/19 11/25/19  [name], MD    Allergies    Nsaids, Grapefruit extract, and Other  Review of Systems   Review of Systems  Respiratory: Positive for shortness of breath.   All other systems reviewed and are negative.   Physical Exam Updated Vital Signs BP (!) 147/102   Pulse 82   Temp 98 F (36.7 C) (Oral)   Resp (!) 21   Ht 5' (1.524 m)   Wt 56.7 kg   LMP 11/13/2019   SpO2 100%   BMI 24.41 kg/m   Physical Exam Vitals and nursing note reviewed.  Constitutional:      Appearance: She is well-developed.  HENT:     Head: Normocephalic and atraumatic.     Mouth/Throat:     Mouth: Mucous membranes are moist.     Pharynx: Oropharynx is clear.  Eyes:     Extraocular Movements: Extraocular movements intact.     Pupils: Pupils are equal, round, and reactive to light.  Cardiovascular:     Rate and Rhythm: Regular rhythm. Tachycardia present.  Pulmonary:     Effort: Pulmonary effort is normal.     Breath sounds: Normal breath sounds.  Abdominal:     General: Bowel sounds are normal.     Palpations: Abdomen is soft.  Musculoskeletal:        General: Normal range of motion.     Cervical back: Normal range of motion and neck supple.  Skin:    General: Skin is warm.     Capillary Refill: Capillary refill takes less than 2 seconds.  Neurological:     General: No focal deficit present.     Mental Status: She is alert and oriented to person, place, and time.  Psychiatric:        Mood and Affect: Mood normal.        Behavior: Behavior normal.     ED Results / Procedures / Treatments   Labs (all labs ordered are listed, but only abnormal results are displayed) Labs Reviewed  BASIC METABOLIC PANEL - Abnormal; Notable for the following components:      Result Value   CO2 18 (*)    BUN 109 (*)    Creatinine, Ser 24.28 (*)    Calcium 7.6 (*)    GFR calc non Af Amer 2 (*)    GFR calc Af Amer 2 (*)    Anion gap  19 (*)    All other components within normal limits  CBC WITH DIFFERENTIAL/PLATELET - Abnormal; Notable for the following components:   RBC 1.96 (*)    Hemoglobin  6.1 (*)    HCT 18.7 (*)    RDW 15.8 (*)    Platelets 96 (*)    All other components within normal limits  PHOSPHORUS - Abnormal; Notable for the following components:   Phosphorus >30.0 (*)    All other components within normal limits  TROPONIN I (HIGH SENSITIVITY) - Abnormal; Notable for the following components:   Troponin I (High Sensitivity) 60 (*)    All other components within normal limits  TROPONIN I (HIGH SENSITIVITY) - Abnormal; Notable for the following components:   Troponin I (High Sensitivity) 54 (*)    All other components within normal limits  SARS CORONAVIRUS 2 (TAT 6-24 HRS)  CULTURE, BLOOD (ROUTINE X 2)  CULTURE, BLOOD (ROUTINE X 2)  MAGNESIUM  URINALYSIS, ROUTINE W REFLEX MICROSCOPIC  PARATHYROID HORMONE, INTACT (NO CA)  FERRITIN  IRON AND TIBC  VITAMIN B12  BODY FLUID CELL COUNT WITH DIFFERENTIAL  I-STAT BETA HCG BLOOD, ED (MC, WL, AP ONLY)  TYPE AND SCREEN  PREPARE RBC (CROSSMATCH)    EKG EKG Interpretation  Date/Time:  Wednesday November 27 2019 08:40:01 EDT Ventricular Rate:  105 PR Interval:    QRS Duration: 92 QT Interval:  388 QTC Calculation: 513 R Axis:   73 Text Interpretation: Sinus tachycardia Probable left atrial enlargement Prolonged QT interval Since last tracing rate faster Confirmed by Isla Pence [phone]) on 11/27/2019 9:00:54 AM   Radiology DG Chest 2 View  Result Date: 11/27/2019 CLINICAL DATA:  Shortness of breath. EXAM: CHEST - 2 VIEW COMPARISON:  11/14/2019 FINDINGS: Cardiomediastinal contours remain enlarged with large mint of the cardiac silhouette that again suggests pericardial fluid. Increased interstitial markings, increased since the prior exam. No sign of pleural effusion or consolidation. Visualized skeletal structures are unremarkable. IMPRESSION: Interval  increase in interstitial markings suggesting interstitial edema, likely related to heart failure or volume overload. Persistent cardiomegaly with contours that again suggests pericardial fluid. Electronically Signed   By: Zetta Bills M.D.   On: 11/27/2019 09:37    Procedures Procedures (including critical care time)  Medications Ordered in ED Medications  multivitamin (RENA-VIT) tablet 1 tablet (has no administration in time range)  cinacalcet (SENSIPAR) tablet 30 mg (has no administration in time range)  sucroferric oxyhydroxide (VELPHORO) chewable tablet 1,000 mg (has no administration in time range)  calcitRIOL (ROCALTROL) capsule 0.5 mcg (has no administration in time range)  Darbepoetin Alfa (ARANESP) injection 200 mcg (has no administration in time range)  gentamicin cream (GARAMYCIN) 0.1 % 1 application (has no administration in time range)  dialysis solution 2.5% low-MG/low-CA dianeal solution (has no administration in time range)  dialysis solution 4.25% low-MG/low-CA dianeal solution (has no administration in time range)  0.9 %  sodium chloride infusion (Manually program via Guardrails IV Fluids) ( Intravenous New Bag/Given 11/27/19 1149)  amLODipine (NORVASC) tablet 10 mg (10 mg Oral Given 11/27/19 1030)  labetalol (NORMODYNE) tablet 200 mg (200 mg Oral Given 11/27/19 1054)  losartan (COZAAR) tablet 50 mg (50 mg Oral Given 11/27/19 1048)  albuterol (VENTOLIN HFA) 108 (90 Base) MCG/ACT inhaler 2 puff (2 puffs Inhalation Given 11/27/19 1053)  AeroChamber Plus Flo-Vu Large MISC 1 each (1 each Other Given 11/27/19 1054)    ED Course  I have reviewed the triage vital signs and the nursing notes.  Pertinent labs & imaging results that were available during my care of the patient were reviewed by me and considered in my medical decision making (see chart for details).    MDM Rules/Calculators/A&P  Pt given home bp meds and bp has come down somewhat.  Pt does have  evidence of fluid overload on CXR.  Suspect this is due to inadequate PD.    Pt placed on 2L oxygen for comfort.  She does feel better with the oxygen.  RA O2 sat 98%.  Pt's hgb has dropped down to 6.1 from 8.6 on 3/12.  Pt typed and crossed and blood ordered for transfusion.  Covid swab is pending at time of admission.  Sneha Willig was evaluated in Emergency Department on 11/27/2019 for the symptoms described in the history of present illness. She was evaluated in the context of the global COVID-19 pandemic, which necessitated consideration that the patient might be at risk for infection with the SARS-CoV-2 virus that causes COVID-19. Institutional protocols and algorithms that pertain to the evaluation of patients at risk for COVID-19 are in a state of rapid change based on information released by regulatory bodies including the CDC and federal and state organizations. These policies and algorithms were followed during the patient's care in the ED.  CRITICAL CARE Performed by: Isla Pence   Total critical care time: 30 minutes  Critical care time was exclusive of separately billable procedures and treating other patients.  Critical care was necessary to treat or prevent imminent or life-threatening deterioration.  Critical care was time spent personally by me on the following activities: development of treatment plan with patient and/or surrogate as well as nursing, discussions with consultants, evaluation of patient's response to treatment, examination of patient, obtaining history from patient or surrogate, ordering and performing treatments and interventions, ordering and review of laboratory studies, ordering and review of radiographic studies, pulse oximetry and re-evaluation of patient's condition.  Final Clinical Impression(s) / ED Diagnoses Final diagnoses:  Acute on chronic anemia  Dyspnea on exertion  ESRD on peritoneal dialysis [facility])  Essential hypertension     Rx / DC Orders ED Discharge Orders    None       Isla Pence, MD 11/27/19 1413

## 2019-11-27 NOTE — ED Triage Notes (Signed)
Onset 2-3 days ago developed fatigue, shortness of breath, and pale. History of anemia.

## 2019-11-27 NOTE — H&P (Signed)
Date: 11/27/2019               Patient Name:  Tara Mills MRN: [id number]  DOB: Jun 13, 1996 Age / Sex: 24 y.o., female   PCP: [place] Service: Internal Medicine Teaching Service              Attending Physician: Dr. Rebeca Alert, Raynaldo Opitz, MD    First Contact: Mikael Spray, MS  Pager: [phone]  Second Contact: Dr. Charleen Kirks Pager: [phone]  Third Contact Dr. 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 She stated that she knew this was due to her "blood levels" because she has had multiple similar episodes in the past that have all felt very similar. She has experienced minimal dizziness. She has also had a cough, anorexia, worsening anxiety, feeling very hot, and diarrhea. The diarrhea however has been ongoing since before her last hospital admission 03/11-03/12, and that she has felt hot since May 2020.  Due to her anorexia she has been unable to take any of her medications for the past few days. She denies any abdominal pain, nausea, joint pain, body aches.  She mentions that the multiple similar episodes she has experienced have been increased due to inability to receive aranesp shots. She also specifically says that her health took a turn in May of 2020. At that time she had similar symptoms of shortness of breath and cough with extremely high creatinine. Additionally, she also had nausea and vomiting with this illness. She says that she is unaware of what sparked this illness, but that her health has increasingly  gotten worse since then.  She also mentions need for parathyroidectomy due to secondary hyperparathyroidism before qualifying for another renal transplant.   Meds:  Current Meds  Medication Sig  . amLODipine (NORVASC) 10 MG tablet Please take 1 tablet by mouth daily  . calcitRIOL (ROCALTROL) 0.5 MCG capsule Take 1 capsule (0.5 mcg total) by mouth 3 (three) times a week. (Patient taking differently: Take 1.5 mcg by mouth 3 (three) times a week. )  . CLARITIN 10 MG tablet Take 10 mg by mouth daily as needed for allergies.   . diphenhydrAMINE (BENADRYL) 25 mg capsule Take 25 mg by mouth as needed for allergies or sleep.  . DULCOLAX 5 MG EC tablet Take 5 mg by mouth at bedtime as needed for mild constipation.   . famotidine (PEPCID) 20 MG tablet Take 20 mg by mouth at bedtime.  Marland Kitchen gentamicin cream (GARAMYCIN) 0.1 % Apply 1 application topically daily.   . isosorbide-hydrALAZINE (BIDIL) 20-37.5 MG tablet Take 2 tablets by mouth 3 (three) times daily.  Marland Kitchen labetalol (NORMODYNE) 200 MG tablet Take 1 tablet (200 mg total) by mouth 3 (three) times daily.  Marland Kitchen lactulose (CHRONULAC) 10 GM/15ML solution Take 30 mLs by mouth as needed for mild constipation.   Marland Kitchen losartan (COZAAR) 50 MG tablet Take 1 tablet (50 mg total) by mouth daily.  Marland Kitchen MIRALAX 17  GM/SCOOP powder Take 1 Container by mouth as needed for mild constipation.   . mycophenolate (CELLCEPT) 500 MG tablet Take 1,000 mg by mouth 2 (two) times daily.  Marland Kitchen NAC 600 600 MG CAPS Take 600 mg by mouth daily as needed.   . sevelamer carbonate (RENVELA) 800 MG tablet Take 1,600 mg by mouth 3 (three) times daily with meals.  . tacrolimus (PROGRAF) 1 MG capsule Take 3 mg by mouth 2 (two) times daily. 3 tabs in the AM and 3 tabs in the evening     Allergies: Allergies as of 11/27/2019 - Review Complete 11/27/2019  Allergen Reaction Noted  . Nsaids Anaphylaxis and Other (See Comments) 04/22/2019  . Grapefruit extract Other (See Comments) 10/18/2019  . Other   11/07/2019   Past Medical History:  Diagnosis Date  . Anemia   . Hypertension   . Renal disorder     Family History:  Denies any family history stating "i'm the only special one" No history of family with similar issues; specifically chronic anemia, or renal issues.  No family history of parathyroid or thyroid disease.   Social History:  Lives at home with her fiancee, no pets. She states she feels safe at home and in her relationship.  No tobacco use, alcohol use, illicit drug use.  Originally from [place], moved to [place] in 2020.   Review of Systems: A complete ROS was negative except as per HPI.  Physical Exam: Blood pressure (!) 145/103, pulse 88, temperature 97.8 F (36.6 C), temperature source Oral, resp. rate (!) 26, height 5' (1.524 m), weight 56.7 kg, last menstrual period 11/13/2019, SpO2 100 %.   Physical Exam Neck:     Thyroid: No thyromegaly.  Cardiovascular:     Rate and Rhythm: Normal rate and regular rhythm.     Pulses: Normal pulses.     Heart sounds: Normal heart sounds.  Pulmonary:     Effort: Pulmonary effort is normal.     Breath sounds: Normal breath sounds.  Abdominal:     Palpations: Abdomen is soft.     Tenderness: There is no abdominal tenderness.  Skin:    Capillary Refill: Capillary refill takes more than 3 seconds.     Coloration: Skin is pale.  Neurological:     Mental Status: She is alert.  Psychiatric:        Mood and Affect: Mood normal.     Ref Range & Units 11:24 09:21  Troponin I (High Sensitivity) <18 ng/L 54High   60High  CM    EKG: personally reviewed my interpretation is sinus tachycardia with prolonged QT   CXR: personally reviewed my interpretation is increased interstitial markings and increased cardiac silhouette   CBC    Component Value Date/Time   WBC 7.3 11/27/2019 0921   RBC 1.96 (L) 11/27/2019 0921   HGB 6.1 (LL) 11/27/2019 0921   HCT 18.7 (L) 11/27/2019 0921   PLT 96 (L) 11/27/2019 0921   MCV 95.4  11/27/2019 0921   MCH 31.1 11/27/2019 0921   MCHC 32.6 11/27/2019 0921   RDW 15.8 (H) 11/27/2019 0921   LYMPHSABS 0.9 11/27/2019 0921   MONOABS 0.3 11/27/2019 0921   EOSABS 0.1 11/27/2019 0921   BASOSABS 0.1 11/27/2019 0921   BMP Latest Ref Rng & Units 11/27/2019 11/15/2019 11/14/2019  Glucose 70 - 99 mg/dL 96 154(H) 111(H)  BUN 6 - 20 mg/dL 109(H) 58(H) 68(H)  Creatinine 0.44 - 1.00 mg/dL 24.28(H) 16.70(H) 19.33(H)  Sodium 135 - 145 mmol/L  139 142 140  Potassium 3.5 - 5.1 mmol/L 3.9 3.5 4.2  Chloride 98 - 111 mmol/L 102 98 100  CO2 22 - 32 mmol/L 18(L) 21(L) 19(L)  Calcium 8.9 - 10.3 mg/dL 7.6(L) 8.5(L) 8.0(L)    Ref Range & Units 09:21  (11/27/19) 1 mo ago  (10/26/19) 1 mo ago  (10/25/19)  Magnesium 1.7 - 2.4 mg/dL 2.2  2.3 CM  2.2 CM     Ref Range & Units 09:21 12 d ago 1 mo ago  Phosphorus 2.5 - 4.6 mg/dL >30.0High   11.5High   9.0High     Assessment & Plan by Problem: Principal Problem:   Symptomatic anemia Active Problems:   Acute on chronic anemia   Volume overload   ESRD on peritoneal dialysis ([place])   (HFpEF) heart failure with preserved ejection fraction (HCC)   Hyperphosphatemia   Thrombocytopathia ([place])  Summary:  Tara Mills is a 24 year old female with a pertinent past medical history of renal dysplasia s/p failed L renal transplant now ESRD on PD, HFpEF, and anemia who presented to the ED due to fatigue and shortness of breath being admitted for symptomatic anemia.   Symptomatic normocytic anemia with thrombocytopenia:  The patient was recently admitted 03/11-03/12 for symptomatic anemia with Hgb pf 6.1 down from 8.6 on discharge 12 days ago. Given her history of ESRD I would expect anemia, however the rate at which her anemia seems to develop is not consistent with chronic anemia of CKD. The patient does say that she has only received one shot of aranesp, which likely is contributing to her anemia but does not explain the swiftness with which her Hgb has  dropped.  She has also had associated thrombocytopenia with her anemia. Hematology was consulted previously and is considering bone marrow biopsy.  - ferritin - iron and TIBC - received 1U pRBC in ED - follow CBC - blood smear - aranesp 200 mcg 2x/wk  Volume overload, HFpEF: CXR showed increased interstitial markings consistent with interstitial pulmonary edema. The patient has not been taking her medications for a few days which likely is contributing to her volume overload. Additionally it is likely that PD is not sufficient to maintain her volume status. The patient also has HFpEF which is also likely contributing to her pulmonary edema and cough. Last echo 10/25/19 showed EF 60-65%.   - Bidil 20-37.5 mg 2 tablets TID - losartan 50 mg daily - labetalol 200 mg TID  - body fluid cell count with differential   Hyperphosphatemia: The patient presented with phosphate levels greater than 30. She has been unable to take her medications for the past few days due to feeling unwell which likely has led to these increased phosphate levels. However, given her volume overload as well as declining kidney function it is likely that her PD is not sufficient for her.  - sucroferric oxyhydroxide 1000 mcg TID  ESRD on PD: The patient has had difficulty tolerating HD in the past and has remained on PD. Nephrology is recommending HD at this time but the patient is not willing to transition to HD.  - renal function panel - nephology consulted; appreciate recommendations - B12 level - PTH  - mycophenolate 1000 mcg BID - tacrolimus 3 mg BID  - continue PD   Diet: renal diet with fluid restriction to 1280m VTE ppx: Heparin CODE STATUS: Full Code  Dispo: Admit patient to Inpatient with expected length of stay greater than 2 midnights.  Signed: WMikael Spray Medical  Student 11/27/2019, 3:30 PM  Pager: [phone]

## 2019-11-27 NOTE — Progress Notes (Signed)
Patient arrived to unit, oriented to unit and room. Verified patient on telemetry, VS stable on 2L O2 [place]. A&Ox4, no pain.

## 2019-11-28 ENCOUNTER — Ambulatory Visit: Payer: Medicaid Other | Admitting: Cardiology

## 2019-11-28 DIAGNOSIS — I132 Hypertensive heart and chronic kidney disease with heart failure and with stage 5 chronic kidney disease, or end stage renal disease: Principal | ICD-10-CM

## 2019-11-28 DIAGNOSIS — D619 Aplastic anemia, unspecified: Secondary | ICD-10-CM

## 2019-11-28 DIAGNOSIS — Z886 Allergy status to analgesic agent status: Secondary | ICD-10-CM

## 2019-11-28 DIAGNOSIS — N186 End stage renal disease: Secondary | ICD-10-CM

## 2019-11-28 DIAGNOSIS — D649 Anemia, unspecified: Secondary | ICD-10-CM

## 2019-11-28 DIAGNOSIS — Z91018 Allergy to other foods: Secondary | ICD-10-CM

## 2019-11-28 DIAGNOSIS — T8612 Kidney transplant failure: Secondary | ICD-10-CM

## 2019-11-28 DIAGNOSIS — R011 Cardiac murmur, unspecified: Secondary | ICD-10-CM

## 2019-11-28 DIAGNOSIS — I5030 Unspecified diastolic (congestive) heart failure: Secondary | ICD-10-CM

## 2019-11-28 DIAGNOSIS — Z9889 Other specified postprocedural states: Secondary | ICD-10-CM

## 2019-11-28 DIAGNOSIS — Z992 Dependence on renal dialysis: Secondary | ICD-10-CM

## 2019-11-28 DIAGNOSIS — D696 Thrombocytopenia, unspecified: Secondary | ICD-10-CM

## 2019-11-28 DIAGNOSIS — Z5329 Procedure and treatment not carried out because of patient's decision for other reasons: Secondary | ICD-10-CM

## 2019-11-28 LAB — PARATHYROID HORMONE, INTACT (NO CA): PTH: 1006 pg/mL — ABNORMAL HIGH (ref 15–65)

## 2019-11-28 LAB — RETICULOCYTES
Immature Retic Fract: 15 % (ref 2.3–15.9)
RBC.: 2.17 MIL/uL — ABNORMAL LOW (ref 3.87–5.11)
Retic Count, Absolute: 33.6 10*3/uL (ref 19.0–186.0)
Retic Ct Pct: 1.6 % (ref 0.4–3.1)

## 2019-11-28 LAB — CBC
HCT: 19.6 % — ABNORMAL LOW (ref 36.0–46.0)
Hemoglobin: 6.7 g/dL — CL (ref 12.0–15.0)
MCH: 30.6 pg (ref 26.0–34.0)
MCHC: 34.2 g/dL (ref 30.0–36.0)
MCV: 89.5 fL (ref 80.0–100.0)
Platelets: 95 10*3/uL — ABNORMAL LOW (ref 150–400)
RBC: 2.19 MIL/uL — ABNORMAL LOW (ref 3.87–5.11)
RDW: 16.2 % — ABNORMAL HIGH (ref 11.5–15.5)
WBC: 6.8 10*3/uL (ref 4.0–10.5)
nRBC: 0 % (ref 0.0–0.2)

## 2019-11-28 LAB — RENAL FUNCTION PANEL
Albumin: 2.7 g/dL — ABNORMAL LOW (ref 3.5–5.0)
Anion gap: 21 — ABNORMAL HIGH (ref 5–15)
BUN: 115 mg/dL — ABNORMAL HIGH (ref 6–20)
CO2: 17 mmol/L — ABNORMAL LOW (ref 22–32)
Calcium: 7.2 mg/dL — ABNORMAL LOW (ref 8.9–10.3)
Chloride: 102 mmol/L (ref 98–111)
Creatinine, Ser: 24.87 mg/dL — ABNORMAL HIGH (ref 0.44–1.00)
GFR calc Af Amer: 2 mL/min — ABNORMAL LOW (ref >60)
GFR calc non Af Amer: 2 mL/min — ABNORMAL LOW (ref >60)
Glucose, Bld: 89 mg/dL (ref 70–99)
Phosphorus: >30 mg/dL — ABNORMAL HIGH (ref 2.5–4.6)
Potassium: 3.7 mmol/L (ref 3.5–5.1)
Sodium: 140 mmol/L (ref 135–145)

## 2019-11-28 LAB — HEPATIC FUNCTION PANEL
ALT: 15 U/L (ref 0–44)
AST: 14 U/L — ABNORMAL LOW (ref 15–41)
Albumin: 2.7 g/dL — ABNORMAL LOW (ref 3.5–5.0)
Alkaline Phosphatase: 82 U/L (ref 38–126)
Bilirubin, Direct: <0.1 mg/dL (ref 0.0–0.2)
Total Bilirubin: 0.8 mg/dL (ref 0.3–1.2)
Total Protein: 5.5 g/dL — ABNORMAL LOW (ref 6.5–8.1)

## 2019-11-28 LAB — PATHOLOGIST SMEAR REVIEW

## 2019-11-28 LAB — PREPARE RBC (CROSSMATCH)

## 2019-11-28 MED ORDER — SODIUM CHLORIDE 0.9% IV SOLUTION: Freq: Once | INTRAVENOUS | Status: AC

## 2019-11-28 MED ORDER — CHLORHEXIDINE GLUCONATE CLOTH 2 % EX PADS: 6.0000 | MEDICATED_PAD | Freq: Every day | CUTANEOUS | Status: DC

## 2019-11-28 MED ORDER — BUTALBITAL-APAP-CAFFEINE 50-325-40 MG PO TABS
1.0000 | ORAL_TABLET | Freq: Once | ORAL | Status: AC
  Administered 2019-11-28: 1 via ORAL
  Filled 2019-11-28: qty 1

## 2019-11-28 NOTE — Progress Notes (Addendum)
Patient ID: Tara Mills, female   DOB: 07-12-1996, 24 y.o.   MRN: [id number]   Subjective: HD: 0   Overnight: Hgb dropped to 6.7 and received 1 U pRBC  Patient states that her shortness of breath has improved overnight. She does admit to left sided headache that started this morning in the temporal region. She admits to having intermittent headaches for the last several weeks to months associated with fluctuation in her vision in her left eye that was secondary to HTN. She believes that her headache that she believes is a migraine. I counseled her regarding our limitations with pain medications. She states that she had the same headache during her admission in February that improved with Fioricet. We had a long conversation regarding the risk and benefits of HD vs PD. Patient admits to feeling like she is not being heard regarding her goals of care. I reassured her that we are all just trying to help her through this difficult process and protect her from further complications.   Objective:  Vital signs in last 24 hours: Vitals:   11/27/19 2337 11/28/19 0315 11/28/19 0533 11/28/19 0612  BP: (!) 136/96 128/84 122/90 (!) 136/98  Pulse: 77 77 72 73  Resp: 18 18 20    Temp: 98.1 F (36.7 C) 98 F (36.7 C) 97.6 F (36.4 C) 97.8 F (36.6 C)  TempSrc: Oral Oral Oral Oral  SpO2: 100% 99% 100% 100%  Weight:      Height:       Weight change:   Intake/Output Summary (Last 24 hours) at 11/28/2019 0704 Last data filed at 11/28/2019 0655 Gross per 24 hour  Intake 640 ml  Output --  Net 640 ml   Physical Exam Neck:     Thyroid: No thyromegaly.  Cardiovascular:     Rate and Rhythm: Normal rate and regular rhythm.     Pulses: Normal pulses.     Heart sounds: Murmur present. Diastolic murmur present with a grade of 1/4.  Pulmonary:     Effort: Pulmonary effort is normal.     Breath sounds: Normal breath sounds.  Abdominal:     General: Bowel sounds are normal.     Palpations:  Abdomen is soft.     Tenderness: There is no abdominal tenderness.  Musculoskeletal:     Right lower leg: No edema.     Left lower leg: No edema.  Skin:    General: Skin is warm and dry.     Capillary Refill: Capillary refill takes 2 to 3 seconds.  Neurological:     Mental Status: She is alert.    CBC Latest Ref Rng & Units 11/28/2019 11/27/2019 11/15/2019  WBC 4.0 - 10.5 K/uL 6.8 7.3 5.7  Hemoglobin 12.0 - 15.0 g/dL 6.7(LL) 6.1(LL) 8.6(L)  Hematocrit 36.0 - 46.0 % 19.6(L) 18.7(L) 26.4(L)  Platelets 150 - 400 K/uL 95(L) 96(L) 196   CMP Latest Ref Rng & Units 11/28/2019 11/27/2019 11/15/2019  Glucose 70 - 99 mg/dL 89 96 154(H)  BUN 6 - 20 mg/dL 115(H) 109(H) 58(H)  Creatinine 0.44 - 1.00 mg/dL 24.87(H) 24.28(H) 16.70(H)  Sodium 135 - 145 mmol/L 140 139 142  Potassium 3.5 - 5.1 mmol/L 3.7 3.9 3.5  Chloride 98 - 111 mmol/L 102 102 98  CO2 22 - 32 mmol/L 17(L) 18(L) 21(L)  Calcium 8.9 - 10.3 mg/dL 7.2(L) 7.6(L) 8.5(L)  Total Protein 6.5 - 8.1 g/dL 5.5(L) - -  Total Bilirubin 0.3 - 1.2 mg/dL 0.8 - -  Alkaline Phos  38 - 126 U/L 82 - -  AST 15 - 41 U/L 14(L) - -  ALT 0 - 44 U/L 15 - -    Ref Range & Units 03:08  Retic Ct Pct 0.4 - 3.1 % 1.6   RBC. 3.87 - 5.11 MIL/uL 2.17Low    Retic Count, Absolute 19.0 - 186.0 K/uL 33.6   Immature Retic Fract 2.3 - 15.9 % 15.0     Ref Range & Units 1 d ago  Iron 28 - 170 ug/dL 164   TIBC 250 - 450 ug/dL 178Low    Saturation Ratios 10.4 - 31.8 % 92High    UIBC ug/dL 14     Ref Range & Units 1 d ago  Ferritin 11 - 307 ng/mL 1,124High     Blood smear:  Hypochromic anemia and thrombocytopenia.   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   Symptomatic acute on chronic normocytic hypoproliferative anemia with thrombocytopenia:  In the ED the patient received 1 U pRBC and post transfusion Hgb was still 6.7 so she received another unit overnight.  Normal iron levels, increased sat, low TIBC and high ferritin levels were noted on labs. She has a reticulocyte index of 0.30. Ongoing concern for hypoproliferative anemia and thrombocytopenia, likely with contribution from anemia of chronic kidney disease, but does not explain thrombocytopenia. - consult hematology  - aranesp 200 mcg 1x/wk, defer to nephrology - trend CBC  Volume overload, HFpEF: Patient presented with interstitial edema, likely due to combination of failed PD, HFpEF, and inability to take medications for a few days preceding admission. Shortness of breath has improved today.  - Bidil 20-37.5 mg 2 tablets TID - losartan 50 mg daily - labetalol 200 mg TID   Hyperphosphatemia, ESRD on PD: Phosphate levels remained elevated above 30 today and are likely due to failed PD and inability to take phosphate binders for a few days. The patient has been unable to tolerate HD in the past and is unwilling to transition to HD at this time.  Nephrology consulted and onboard; we appreciate recommendations. - sucroferric oxyhydroxide 1000 mcg TID - mycophenolate 1000 mcg BID - tacrolimus 3 mg BID  - trend renal function panel - try to place [facility] and initiate HD if possible - low phosphorous diet    Diet: renal diet with fluid restriction to 1262mL VTE ppx: Heparin CODE STATUS: Full Code   LOS: 1 day   Mikael Spray, Medical Student 11/28/2019, 7:04 AM

## 2019-11-28 NOTE — Progress Notes (Addendum)
[facility] Progress Note   Dialysis Orders: Nephrologist: Dr. Johnney Ou CAPD 7 days a week, 4 exchanged/2 hour dwells1.8 L , all 2.5% per pt, EDW 59kg (previously 57 kg) Calcitriol 0.75 mcg 3x week Aranesp 200 mcg SubQ every 2 weeks- due for dose 3/24 Velphoro 500mg  2 tabs PO TID Sensipar 30mg  QD  BP meds: Bidil 20-37.5mg  2 tabs TID ( on home med list but not last d/c summary), labetalol 200mg  TID, losartan 50mg  QD, amlodipine 10mg  QD  Assessment/Plan: 1. Acute on chronic anemia - not clear why hgb drop - last tsat 60%  Transfusing 1 unit PRBC today ; continue ESA - had heme consult 10/30/19 considering BM Bx in the future - 3/8 was due for Aranesp dose today - will change to 200 per week at discharge.  S/p 2 units PRBC hgb 6.7 Iron studies drawn post transfusion. 2. ESRD with failed tx 01/2019 uremic with mild metabolic acidosis  At home on CAPD with night time dwell -Not clear why no CCPD last night as ordered. Also revisited the suggestion of trying HD again but she is opposed.; continue tx meds. Of note, her renal labs were dramatically better in February when she was receiving HD Dr. Joelyn Oms to speak with patient about need to transition back to HD and Dr. Johnney Ou to help with headaches. Plan to place [facility] hopefully later today and initiate gradual dialysis to avoid disequilibrium. 3. Hypertension/volume  - on multiple meds - just refilled Rx: -BP better today with meds on board. 4. Metabolic bone disease - uncontrolled secondary hyperparathyroidism - continue sensipar/binders/calcitriol - favor daily dosing of calcitriol - not clear that she takes any of her meds at home.  Her labs would suggest she does not.  Tells me she has seen [facility] for  parathyroidectomy- now issues with scheduling and needs cardiac clearance. 5.  Nutrition - anorexia is likely in part due to uremia - needs low P diet, renavites   Myriam Jacobson, PA-C [place] 11/28/2019,11:27 AM  LOS: 1 day   Subjective:   Did not get dialysis ([facility]) last evening.  Not clear why.Feels some better again.  Again believes spike in Cr is due to hgb drop when it is mostly likely due to not doing PD as Rx.  Objective Vitals:   11/28/19 0315 11/28/19 0533 11/28/19 0612 11/28/19 0845  BP: 128/84 122/90 (!) 136/98 (!) 150/107  Pulse: 77 72 73 78  Resp: 18 20  20   Temp: 98 F (36.7 C) 97.6 F (36.4 C) 97.8 F (36.6 C) 97.6 F (36.4 C)  TempSrc: Oral Oral Oral Oral  SpO2: 99% 100% 100% 100%  Weight:      Height:       Physical Exam General: pale chronically ill female NAD alert and conversant Heart: RRR Lungs: dim bases no rales Abdomen: soft TLQ tx kidney nontender Extremities: no sig LE edema Dialysis Access: PD cath intact   Additional Objective Labs: Basic Metabolic Panel: Recent Labs  Lab 11/27/19 0921 11/28/19 0308  NA 139 140  K 3.9 3.7  CL 102 102  CO2 18* 17*  GLUCOSE 96 89  BUN 109* 115*  CREATININE 24.28* 24.87*  CALCIUM 7.6* 7.2*  PHOS >30.0* >30.0*   Liver Function Tests: Recent Labs  Lab 11/28/19 0308  AST 14*  ALT 15  ALKPHOS 82  BILITOT 0.8  PROT 5.5*  ALBUMIN 2.7*  2.7*  CBC: Recent Labs  Lab 11/27/19 0921 11/28/19 0308  WBC  7.3 6.8  NEUTROABS 5.8  --   HGB 6.1* 6.7*  HCT 18.7* 19.6*  MCV 95.4 89.5  PLT 96* 95*   Blood Culture    Component Value Date/Time   SDES BLOOD RIGHT HAND 11/27/2019 2028   SPECREQUEST  11/27/2019 2028    AEROBIC BOTTLE ONLY Blood Culture results may not be optimal due to an inadequate volume of blood received in culture bottles   CULT  11/27/2019 2028    NO GROWTH < 12 HOURS Performed at [facility] [address] [place], [place] [address]    REPTSTATUS PENDING 11/27/2019 2028    Cardiac Enzymes: No results for input(s): CKTOTAL, CKMB, CKMBINDEX, TROPONINI in the last 168 hours. CBG: No results for input(s): GLUCAP in the last 168 hours. Iron Studies:   Recent Labs    11/27/19 2015  IRON 164  TIBC 178*  FERRITIN 1,124*   Lab Results  Component Value Date   INR 1.2 10/21/2019   Studies/Results: DG Chest 2 View  Result Date: 11/27/2019 CLINICAL DATA:  Shortness of breath. EXAM: CHEST - 2 VIEW COMPARISON:  11/14/2019 FINDINGS: Cardiomediastinal contours remain enlarged with large mint of the cardiac silhouette that again suggests pericardial fluid. Increased interstitial markings, increased since the prior exam. No sign of pleural effusion or consolidation. Visualized skeletal structures are unremarkable. IMPRESSION: Interval increase in interstitial markings suggesting interstitial edema, likely related to heart failure or volume overload. Persistent cardiomegaly with contours that again suggests pericardial fluid. Electronically Signed   By: Zetta Bills M.D.   On: 11/27/2019 09:37   Medications: . dialysis solution 2.5% low-MG/low-CA    . dialysis solution 4.25% low-MG/low-CA     . amLODipine  10 mg Oral Daily  . butalbital-acetaminophen-caffeine  1 tablet Oral Once  . calcitRIOL  0.5 mcg Oral Daily  . cinacalcet  30 mg Oral Q supper  . darbepoetin (ARANESP) injection - NON-DIALYSIS  200 mcg Subcutaneous Q Wed-1800  . gentamicin cream  1 application Topical Daily  . isosorbide-hydrALAZINE  2 tablet Oral TID  . labetalol  200 mg Oral TID  . losartan  50 mg Oral Daily  . multivitamin  1 tablet Oral QHS  . mycophenolate  1,000 mg Oral BID  . sucroferric oxyhydroxide  1,000 mg Oral TID WC  . tacrolimus  3 mg Oral BID

## 2019-11-28 NOTE — Progress Notes (Signed)
Patient decided to leave AMA, states she would like to go to [facility] instead. Patient signed AMA form, IV removed. MD aware

## 2019-11-28 NOTE — Progress Notes (Signed)
Hg of 6.7. Spoke with MD via phone.

## 2019-11-29 LAB — TYPE AND SCREEN
ABO/RH(D): AB POS
Antibody Screen: NEGATIVE
Unit division: 0
Unit division: 0

## 2019-11-29 LAB — BPAM RBC
Blood Product Expiration Date: 202104212359
Blood Product Expiration Date: 202104252359
ISSUE DATE / TIME: 202103241154
ISSUE DATE / TIME: 202103250540
Unit Type and Rh: 6200
Unit Type and Rh: 8400

## 2019-11-29 MED ORDER — GENERIC EXTERNAL MEDICATION
75.00 | Status: DC
Start: 2019-12-01 — End: 2019-11-29

## 2019-11-29 MED ORDER — MYCOPHENOLATE MOFETIL 500 MG PO TABS
500.00 | ORAL_TABLET | ORAL | Status: DC
Start: 2019-12-03 — End: 2019-11-29

## 2019-11-29 MED ORDER — CINACALCET HCL 30 MG PO TABS
30.00 | ORAL_TABLET | ORAL | Status: DC
Start: 2019-12-04 — End: 2019-11-29

## 2019-11-29 MED ORDER — GENERIC EXTERNAL MEDICATION
Status: DC
Start: ? — End: 2019-11-29

## 2019-11-29 MED ORDER — BISACODYL 5 MG PO TBEC
5.00 | DELAYED_RELEASE_TABLET | ORAL | Status: DC
Start: ? — End: 2019-11-29

## 2019-11-29 MED ORDER — TACROLIMUS 1 MG PO CAPS
2.00 | ORAL_CAPSULE | ORAL | Status: DC
Start: 2019-12-03 — End: 2019-11-29

## 2019-11-29 MED ORDER — LACTULOSE 10 GM/15ML PO SOLN
20.00 | ORAL | Status: DC
Start: ? — End: 2019-11-29

## 2019-11-29 MED ORDER — SEVELAMER CARBONATE 800 MG PO TABS
1600.00 | ORAL_TABLET | ORAL | Status: DC
Start: 2019-12-03 — End: 2019-11-29

## 2019-11-29 MED ORDER — RENA-VITE PO TABS
1.00 | ORAL_TABLET | ORAL | Status: DC
Start: 2019-12-03 — End: 2019-11-29

## 2019-11-29 MED ORDER — FAMOTIDINE 20 MG PO TABS
20.00 | ORAL_TABLET | ORAL | Status: DC
Start: 2019-12-03 — End: 2019-11-29

## 2019-11-29 MED ORDER — DIANEAL LOW CALCIUM/2.5% DEX 395 MOSM/L IP SOLN
0.00 | INTRAPERITONEAL | Status: DC
Start: ? — End: 2019-11-29

## 2019-11-29 MED ORDER — CALCITRIOL 0.25 MCG PO CAPS
0.50 | ORAL_CAPSULE | ORAL | Status: DC
Start: 2019-12-04 — End: 2019-11-29

## 2019-11-29 MED ORDER — ISOSORBIDE DINITRATE 20 MG PO TABS
40.00 | ORAL_TABLET | ORAL | Status: DC
Start: 2019-12-03 — End: 2019-11-29

## 2019-11-29 MED ORDER — LIDOCAINE HCL 1 % IJ SOLN
0.50 | INTRAMUSCULAR | Status: DC
Start: ? — End: 2019-11-29

## 2019-11-29 MED ORDER — AMLODIPINE BESYLATE 10 MG PO TABS
10.00 | ORAL_TABLET | ORAL | Status: DC
Start: 2019-12-04 — End: 2019-11-29

## 2019-11-29 MED ORDER — LOSARTAN POTASSIUM 50 MG PO TABS
50.00 | ORAL_TABLET | ORAL | Status: DC
Start: 2019-12-04 — End: 2019-11-29

## 2019-11-29 MED ORDER — POLYETHYLENE GLYCOL 3350 17 GM/SCOOP PO POWD
17.00 | ORAL | Status: DC
Start: ? — End: 2019-11-29

## 2019-11-29 MED ORDER — LABETALOL HCL 200 MG PO TABS
200.00 | ORAL_TABLET | ORAL | Status: DC
Start: 2019-12-03 — End: 2019-11-29

## 2019-11-29 MED ORDER — ACETAMINOPHEN 325 MG PO TABS
650.00 | ORAL_TABLET | ORAL | Status: DC
Start: ? — End: 2019-11-29

## 2019-12-01 MED ORDER — FLUTICASONE PROPIONATE 50 MCG/ACT NA SUSP
2.00 | NASAL | Status: DC
Start: ? — End: 2019-12-01

## 2019-12-01 MED ORDER — LORATADINE 10 MG PO TABS
10.00 | ORAL_TABLET | ORAL | Status: DC
Start: 2019-12-05 — End: 2019-12-01

## 2019-12-01 MED ORDER — GENTAMICIN SULFATE 0.1 % EX CREA
TOPICAL_CREAM | CUTANEOUS | Status: DC
Start: 2019-12-04 — End: 2019-12-01

## 2019-12-01 MED ORDER — BENZONATATE 100 MG PO CAPS
100.00 | ORAL_CAPSULE | ORAL | Status: DC
Start: 2019-12-04 — End: 2019-12-01

## 2019-12-01 MED ORDER — DIANEAL LOW CALCIUM/2.5% DEX 395 MOSM/L IP SOLN
0.00 | INTRAPERITONEAL | Status: DC
Start: ? — End: 2019-12-01

## 2019-12-01 MED ORDER — GENERIC EXTERNAL MEDICATION
Status: DC
Start: ? — End: 2019-12-01

## 2019-12-01 MED ORDER — EXTRANEAL 7.5 % IP SOLN
0.00 | INTRAPERITONEAL | Status: DC
Start: ? — End: 2019-12-01

## 2019-12-01 NOTE — Discharge Summary (Addendum)
Name: Tara Mills MRN: [id number] DOB: 1996-05-08 24 y.o. PCP: [place]  Date of Admission: 11/27/2019  8:30 AM Date of Discharge: 11/28/2019 Attending Physician: Dr. Rebeca Alert  Discharge Diagnosis: 1. Acute on Chronic Normocytic Anemia  2. Thrombocytopenia  3. Hypervolemia  4. Hyperphosphatemia 5. ESRD on PD  Discharge Medications: Allergies as of 11/28/2019       Reactions   Nsaids Anaphylaxis, Other (See Comments)   Due to ESRD   Grapefruit Extract Other (See Comments)   Per pt: can't have because of medications   Other    Other reaction(s): Unknown        Medication List     ASK your doctor about these medications    amLODipine 10 MG tablet Commonly known as: NORVASC Take 1 tablet (10 mg total) by mouth daily.   amLODipine 10 MG tablet Commonly known as: NORVASC Please take 1 tablet by mouth daily   BiDil 20-37.5 MG tablet Generic drug: isosorbide-hydrALAZINE Take 2 tablets by mouth 3 (three) times daily.   calcitRIOL 0.5 MCG capsule Commonly known as: ROCALTROL Take 1 capsule (0.5 mcg total) by mouth 3 (three) times a week. Ask about: Should I take this medication?   Claritin 10 MG tablet Generic drug: loratadine Take 10 mg by mouth daily as needed for allergies.   diphenhydrAMINE 25 mg capsule Commonly known as: BENADRYL Take 25 mg by mouth as needed for allergies or sleep.   Dulcolax 5 MG EC tablet Generic drug: bisacodyl Take 5 mg by mouth at bedtime as needed for mild constipation.   famotidine 20 MG tablet Commonly known as: PEPCID Take 20 mg by mouth at bedtime.   gentamicin cream 0.1 % Commonly known as: GARAMYCIN Apply 1 application topically daily.   labetalol 200 MG tablet Commonly known as: NORMODYNE Take 1 tablet (200 mg total) by mouth 3 (three) times daily.   lactulose 10 GM/15ML solution Commonly known as: CHRONULAC Take 30 mLs by mouth as needed for mild constipation.   losartan 50 MG  tablet Commonly known as: COZAAR Take 1 tablet (50 mg total) by mouth daily.   MiraLax 17 GM/SCOOP powder Generic drug: polyethylene glycol powder Take 1 Container by mouth as needed for mild constipation.   mycophenolate 500 MG tablet Commonly known as: CELLCEPT Take 1,000 mg by mouth 2 (two) times daily.   Nac 600 600 MG Caps Generic drug: Acetylcysteine Take 600 mg by mouth daily as needed.   ramelteon 8 MG tablet Commonly known as: ROZEREM Take 1 tablet (8 mg total) by mouth at bedtime.   sevelamer carbonate 800 MG tablet Commonly known as: RENVELA Take 1,600 mg by mouth 3 (three) times daily with meals.   tacrolimus 1 MG capsule Commonly known as: PROGRAF Take 3 mg by mouth 2 (two) times daily. 3 tabs in the AM and 3 tabs in the evening        Disposition and follow-up:   [name] left AMA from [facility] in [place] condition.  At the hospital follow up visit please address:  1.    Discuss transition to HD, as PD is unlikely to be a safe long-term option for this patient Ensure hematology/oncology follow up Emphasize medication compliance   2.  Labs / imaging needed at time of follow-up: CBC, BMP, Phos   3.  Pending labs/ test needing follow-up: None   Hospital Course by problem list: 1. Acute on Chronic Normocytic Anemia  Patient presented to the  ED with symptoms consistent with acute anemia including shortness of breath and fatigue.  She has a past medical history of these acute on chronic drop in her hemoglobin that were previously felt to be due to her ESRD.  Patient stated she has not been receiving her outpatient Aranesp injections.  She has followed up with hematology at [facility] who is considering a bone marrow biopsy given her unusual presentation as one would not expect acute drops with anemia chronic kidney disease along with unexplained thrombocytopenia.  Patient received 2 units of packed red blood cells during her  admission prior to leaving AMA.  Post transfusion CBC was unable to be obtained.  2. Thrombocytopenia  Patient has a history of intermittent thrombocytopenia, presented with a platelet count of 96.  No evidence of acute bleed at the time.  Recommend outpatient follow-up with hematology.  3. Hypervolemia  Chest x-ray on presentation showed increased interstitial markings consistent with pulmonary edema.  There is been concern in the past that PT is not sufficient to maintain her volume status.  Nephrology had recommended during this admission and prior admissions to transition to HD, however patient is reluctant to do so at this time.  4. Hyperphosphatemia Phosphate level on presentation is above 30.  No evidence of arrhythmias during admission.  5. 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  Patient has been on PD for short period of time after failing to tolerate HD.  She follows up with [facility] in [place].   Discharge Vitals:   BP 112/72 (BP Location: Left Arm)   Pulse 78   Temp (!) 97.4 F (36.3 C) (Oral)   Resp 18   Ht 5' (1.524 m)   Wt 56.7 kg   LMP 11/13/2019   SpO2 100%   BMI 24.41 kg/m   Pertinent Labs, Studies, and Procedures:   CBC Latest Ref Rng & Units 11/28/2019 11/27/2019 11/15/2019  WBC 4.0 - 10.5 K/uL 6.8 7.3 5.7  Hemoglobin 12.0 - 15.0 g/dL 6.7(LL) 6.1(LL) 8.6(L)  Hematocrit 36.0 - 46.0 % 19.6(L) 18.7(L) 26.4(L)  Platelets 150 - 400 K/uL 95(L) 96(L) 196   BMP Latest Ref Rng & Units 11/28/2019 11/27/2019 11/15/2019  Glucose 70 - 99 mg/dL 89 96 154(H)  BUN 6 - 20 mg/dL 115(H) 109(H) 58(H)  Creatinine 0.44 - 1.00 mg/dL 24.87(H) 24.28(H) 16.70(H)  Sodium 135 - 145 mmol/L 140 139 142  Potassium 3.5 - 5.1 mmol/L 3.7 3.9 3.5  Chloride 98 - 111 mmol/L 102 102 98  CO2 22 - 32 mmol/L 17(L) 18(L) 21(L)  Calcium 8.9 - 10.3 mg/dL 7.2(L) 7.6(L) 8.5(L)   Lab Results  Component Value Date   PTH 1,006 (H)  11/27/2019   CALCIUM 7.2 (L) 11/28/2019   PHOS >30.0 (H) 11/28/2019    Signed: Dr. Jose Persia Internal Medicine PGY-1  Pager: [phone] 12/01/2019, 5:39 PM

## 2019-12-02 LAB — CULTURE, BLOOD (ROUTINE X 2)
Culture: NO GROWTH
Culture: NO GROWTH
Special Requests: ADEQUATE

## 2019-12-02 MED ORDER — GENERIC EXTERNAL MEDICATION
Status: DC
Start: ? — End: 2019-12-02

## 2019-12-03 MED ORDER — HEPARIN SODIUM (PORCINE) 1000 UNIT/ML IJ SOLN
INTRAMUSCULAR | Status: DC
Start: ? — End: 2019-12-03

## 2019-12-03 MED ORDER — GENERIC EXTERNAL MEDICATION
Status: DC
Start: ? — End: 2019-12-03

## 2019-12-03 MED ORDER — DIANEAL LOW CALCIUM/2.5% DEX 395 MOSM/L IP SOLN
0.00 | INTRAPERITONEAL | Status: DC
Start: ? — End: 2019-12-03

## 2019-12-04 LAB — ACID FAST CULTURE WITH REFLEXED SENSITIVITIES (MYCOBACTERIA): Acid Fast Culture: NEGATIVE

## 2019-12-05 MED ORDER — GENERIC EXTERNAL MEDICATION
Status: DC
Start: ? — End: 2019-12-05

## 2019-12-28 ENCOUNTER — Inpatient Hospital Stay (HOSPITAL_COMMUNITY)
Admission: EM | Admit: 2019-12-28 | Discharge: 2020-01-03 | DRG: 314 | Disposition: A | Discharge status: Home / Self Care | Payer: Medicaid Other | Attending: Internal Medicine | Admitting: Internal Medicine

## 2019-12-28 ENCOUNTER — Other Ambulatory Visit: Payer: Self-pay

## 2019-12-28 ENCOUNTER — Emergency Department (HOSPITAL_COMMUNITY): Payer: Medicaid Other

## 2019-12-28 DIAGNOSIS — Z91018 Allergy to other foods: Secondary | ICD-10-CM

## 2019-12-28 DIAGNOSIS — R7881 Bacteremia: Secondary | ICD-10-CM | POA: Diagnosis present

## 2019-12-28 DIAGNOSIS — T827XXA Infection and inflammatory reaction due to other cardiac and vascular devices, implants and grafts, initial encounter: Principal | ICD-10-CM | POA: Diagnosis present

## 2019-12-28 DIAGNOSIS — E875 Hyperkalemia: Secondary | ICD-10-CM | POA: Diagnosis present

## 2019-12-28 DIAGNOSIS — D631 Anemia in chronic kidney disease: Secondary | ICD-10-CM | POA: Diagnosis present

## 2019-12-28 DIAGNOSIS — D696 Thrombocytopenia, unspecified: Secondary | ICD-10-CM | POA: Diagnosis present

## 2019-12-28 DIAGNOSIS — Z79899 Other long term (current) drug therapy: Secondary | ICD-10-CM

## 2019-12-28 DIAGNOSIS — Z9119 Patient's noncompliance with other medical treatment and regimen: Secondary | ICD-10-CM

## 2019-12-28 DIAGNOSIS — I132 Hypertensive heart and chronic kidney disease with heart failure and with stage 5 chronic kidney disease, or end stage renal disease: Secondary | ICD-10-CM | POA: Diagnosis present

## 2019-12-28 DIAGNOSIS — Z6823 Body mass index (BMI) 23.0-23.9, adult: Secondary | ICD-10-CM

## 2019-12-28 DIAGNOSIS — B9562 Methicillin resistant Staphylococcus aureus infection as the cause of diseases classified elsewhere: Secondary | ICD-10-CM | POA: Diagnosis present

## 2019-12-28 DIAGNOSIS — I5030 Unspecified diastolic (congestive) heart failure: Secondary | ICD-10-CM | POA: Diagnosis present

## 2019-12-28 DIAGNOSIS — Z95828 Presence of other vascular implants and grafts: Secondary | ICD-10-CM

## 2019-12-28 DIAGNOSIS — I5032 Chronic diastolic (congestive) heart failure: Secondary | ICD-10-CM | POA: Diagnosis present

## 2019-12-28 DIAGNOSIS — R739 Hyperglycemia, unspecified: Secondary | ICD-10-CM | POA: Diagnosis present

## 2019-12-28 DIAGNOSIS — D649 Anemia, unspecified: Secondary | ICD-10-CM | POA: Diagnosis present

## 2019-12-28 DIAGNOSIS — Z419 Encounter for procedure for purposes other than remedying health state, unspecified: Secondary | ICD-10-CM

## 2019-12-28 DIAGNOSIS — E162 Hypoglycemia, unspecified: Secondary | ICD-10-CM | POA: Diagnosis not present

## 2019-12-28 DIAGNOSIS — N186 End stage renal disease: Secondary | ICD-10-CM

## 2019-12-28 DIAGNOSIS — T8612 Kidney transplant failure: Secondary | ICD-10-CM | POA: Diagnosis present

## 2019-12-28 DIAGNOSIS — Z20822 Contact with and (suspected) exposure to covid-19: Secondary | ICD-10-CM | POA: Diagnosis present

## 2019-12-28 DIAGNOSIS — N2581 Secondary hyperparathyroidism of renal origin: Secondary | ICD-10-CM | POA: Diagnosis present

## 2019-12-28 DIAGNOSIS — N19 Unspecified kidney failure: Secondary | ICD-10-CM

## 2019-12-28 DIAGNOSIS — Z886 Allergy status to analgesic agent status: Secondary | ICD-10-CM

## 2019-12-28 DIAGNOSIS — Z992 Dependence on renal dialysis: Secondary | ICD-10-CM

## 2019-12-28 DIAGNOSIS — E440 Moderate protein-calorie malnutrition: Secondary | ICD-10-CM | POA: Insufficient documentation

## 2019-12-28 HISTORY — DX: Other specified postprocedural states: Z98.890

## 2019-12-28 HISTORY — DX: Disorder of parathyroid gland, unspecified: E21.5

## 2019-12-28 HISTORY — DX: Dorsalgia, unspecified: M54.9

## 2019-12-28 HISTORY — DX: Other complications of anesthesia, initial encounter: T88.59XA

## 2019-12-28 HISTORY — DX: Personal history of other medical treatment: Z92.89

## 2019-12-28 HISTORY — DX: Other specified postprocedural states: R11.2

## 2019-12-28 NOTE — ED Notes (Signed)
Pt requesting labs be drawn with IV stick

## 2019-12-28 NOTE — ED Triage Notes (Signed)
Pt had a dialysis catheter placed to R chest on 4/19. Reports increased pain to site and has not been used since placement. Pt also has a PD cathether to abd, last treatment was yesterday. Pt reports fever, fatigue, generalized weakness, and R  breast pain (bruising noted).

## 2019-12-29 ENCOUNTER — Other Ambulatory Visit: Payer: Self-pay

## 2019-12-29 ENCOUNTER — Encounter (HOSPITAL_COMMUNITY): Payer: Self-pay | Admitting: Internal Medicine

## 2019-12-29 DIAGNOSIS — Z79899 Other long term (current) drug therapy: Secondary | ICD-10-CM | POA: Diagnosis not present

## 2019-12-29 DIAGNOSIS — I132 Hypertensive heart and chronic kidney disease with heart failure and with stage 5 chronic kidney disease, or end stage renal disease: Secondary | ICD-10-CM | POA: Diagnosis present

## 2019-12-29 DIAGNOSIS — Z91018 Allergy to other foods: Secondary | ICD-10-CM | POA: Diagnosis not present

## 2019-12-29 DIAGNOSIS — Z6823 Body mass index (BMI) 23.0-23.9, adult: Secondary | ICD-10-CM | POA: Diagnosis not present

## 2019-12-29 DIAGNOSIS — Z992 Dependence on renal dialysis: Secondary | ICD-10-CM | POA: Diagnosis not present

## 2019-12-29 DIAGNOSIS — T82898A Other specified complication of vascular prosthetic devices, implants and grafts, initial encounter: Secondary | ICD-10-CM | POA: Diagnosis not present

## 2019-12-29 DIAGNOSIS — N186 End stage renal disease: Secondary | ICD-10-CM

## 2019-12-29 DIAGNOSIS — E440 Moderate protein-calorie malnutrition: Secondary | ICD-10-CM | POA: Diagnosis present

## 2019-12-29 DIAGNOSIS — Z886 Allergy status to analgesic agent status: Secondary | ICD-10-CM | POA: Diagnosis not present

## 2019-12-29 DIAGNOSIS — N2581 Secondary hyperparathyroidism of renal origin: Secondary | ICD-10-CM | POA: Diagnosis present

## 2019-12-29 DIAGNOSIS — T827XXA Infection and inflammatory reaction due to other cardiac and vascular devices, implants and grafts, initial encounter: Secondary | ICD-10-CM | POA: Diagnosis present

## 2019-12-29 DIAGNOSIS — D631 Anemia in chronic kidney disease: Secondary | ICD-10-CM | POA: Diagnosis present

## 2019-12-29 DIAGNOSIS — B9562 Methicillin resistant Staphylococcus aureus infection as the cause of diseases classified elsewhere: Secondary | ICD-10-CM | POA: Diagnosis present

## 2019-12-29 DIAGNOSIS — I5032 Chronic diastolic (congestive) heart failure: Secondary | ICD-10-CM | POA: Diagnosis present

## 2019-12-29 DIAGNOSIS — R509 Fever, unspecified: Secondary | ICD-10-CM | POA: Diagnosis present

## 2019-12-29 DIAGNOSIS — T8612 Kidney transplant failure: Secondary | ICD-10-CM | POA: Diagnosis present

## 2019-12-29 DIAGNOSIS — R7881 Bacteremia: Secondary | ICD-10-CM | POA: Diagnosis present

## 2019-12-29 DIAGNOSIS — D696 Thrombocytopenia, unspecified: Secondary | ICD-10-CM | POA: Diagnosis present

## 2019-12-29 DIAGNOSIS — E162 Hypoglycemia, unspecified: Secondary | ICD-10-CM | POA: Diagnosis not present

## 2019-12-29 DIAGNOSIS — I509 Heart failure, unspecified: Secondary | ICD-10-CM | POA: Diagnosis not present

## 2019-12-29 DIAGNOSIS — Z20822 Contact with and (suspected) exposure to covid-19: Secondary | ICD-10-CM | POA: Diagnosis present

## 2019-12-29 DIAGNOSIS — R739 Hyperglycemia, unspecified: Secondary | ICD-10-CM | POA: Diagnosis present

## 2019-12-29 DIAGNOSIS — E875 Hyperkalemia: Secondary | ICD-10-CM | POA: Diagnosis present

## 2019-12-29 DIAGNOSIS — I120 Hypertensive chronic kidney disease with stage 5 chronic kidney disease or end stage renal disease: Secondary | ICD-10-CM | POA: Diagnosis not present

## 2019-12-29 DIAGNOSIS — A4902 Methicillin resistant Staphylococcus aureus infection, unspecified site: Secondary | ICD-10-CM | POA: Diagnosis not present

## 2019-12-29 DIAGNOSIS — Z9119 Patient's noncompliance with other medical treatment and regimen: Secondary | ICD-10-CM | POA: Diagnosis not present

## 2019-12-29 LAB — CBC WITH DIFFERENTIAL/PLATELET
Abs Immature Granulocytes: 0.29 10*3/uL — ABNORMAL HIGH (ref 0.00–0.07)
Basophils Absolute: 0.1 10*3/uL (ref 0.0–0.1)
Basophils Relative: 1 %
Eosinophils Absolute: 0.1 10*3/uL (ref 0.0–0.5)
Eosinophils Relative: 1 %
HCT: 23.3 % — ABNORMAL LOW (ref 36.0–46.0)
Hemoglobin: 7.2 g/dL — ABNORMAL LOW (ref 12.0–15.0)
Immature Granulocytes: 3 %
Lymphocytes Relative: 10 %
Lymphs Abs: 1 10*3/uL (ref 0.7–4.0)
MCH: 30 pg (ref 26.0–34.0)
MCHC: 30.9 g/dL (ref 30.0–36.0)
MCV: 97.1 fL (ref 80.0–100.0)
Monocytes Absolute: 0.8 10*3/uL (ref 0.1–1.0)
Monocytes Relative: 8 %
Neutro Abs: 7.9 10*3/uL — ABNORMAL HIGH (ref 1.7–7.7)
Neutrophils Relative %: 77 %
Platelets: 89 10*3/uL — ABNORMAL LOW (ref 150–400)
RBC: 2.4 MIL/uL — ABNORMAL LOW (ref 3.87–5.11)
RDW: 17.7 % — ABNORMAL HIGH (ref 11.5–15.5)
WBC: 10.1 10*3/uL (ref 4.0–10.5)
nRBC: 0.2 % (ref 0.0–0.2)

## 2019-12-29 LAB — COMPREHENSIVE METABOLIC PANEL
ALT: 12 U/L (ref 0–44)
AST: 12 U/L — ABNORMAL LOW (ref 15–41)
Albumin: 3.1 g/dL — ABNORMAL LOW (ref 3.5–5.0)
Alkaline Phosphatase: 123 U/L (ref 38–126)
BUN: 212 mg/dL — ABNORMAL HIGH (ref 6–20)
CO2: <7 mmol/L — ABNORMAL LOW (ref 22–32)
Calcium: 7.7 mg/dL — ABNORMAL LOW (ref 8.9–10.3)
Chloride: 97 mmol/L — ABNORMAL LOW (ref 98–111)
Creatinine, Ser: 45.04 mg/dL — ABNORMAL HIGH (ref 0.44–1.00)
GFR calc Af Amer: 1 mL/min — ABNORMAL LOW (ref >60)
GFR calc non Af Amer: 1 mL/min — ABNORMAL LOW (ref >60)
Glucose, Bld: 73 mg/dL (ref 70–99)
Potassium: >7.5 mmol/L (ref 3.5–5.1)
Sodium: 134 mmol/L — ABNORMAL LOW (ref 135–145)
Total Bilirubin: 0.8 mg/dL (ref 0.3–1.2)
Total Protein: 6 g/dL — ABNORMAL LOW (ref 6.5–8.1)

## 2019-12-29 LAB — GLUCOSE, CAPILLARY
Glucose-Capillary: 62 mg/dL — ABNORMAL LOW (ref 70–99)
Glucose-Capillary: 65 mg/dL — ABNORMAL LOW (ref 70–99)
Glucose-Capillary: 87 mg/dL (ref 70–99)
Glucose-Capillary: 73 mg/dL (ref 70–99)
Glucose-Capillary: 125 mg/dL — ABNORMAL HIGH (ref 70–99)

## 2019-12-29 LAB — BLOOD CULTURE ID PANEL (REFLEXED)

## 2019-12-29 LAB — RESPIRATORY PANEL BY RT PCR (FLU A&B, COVID)
Influenza A by PCR: NEGATIVE
Influenza B by PCR: NEGATIVE
SARS Coronavirus 2 by RT PCR: NEGATIVE

## 2019-12-29 LAB — IRON AND TIBC
Iron: 166 ug/dL (ref 28–170)
Saturation Ratios: 89 % — ABNORMAL HIGH (ref 10.4–31.8)
TIBC: 186 ug/dL — ABNORMAL LOW (ref 250–450)
UIBC: 20 ug/dL

## 2019-12-29 LAB — LACTIC ACID, PLASMA
Lactic Acid, Venous: 1.1 mmol/L (ref 0.5–1.9)
Lactic Acid, Venous: 1.8 mmol/L (ref 0.5–1.9)

## 2019-12-29 LAB — I-STAT BETA HCG BLOOD, ED (MC, WL, AP ONLY): I-stat hCG, quantitative: 8.3 m[IU]/mL — ABNORMAL HIGH (ref <5)

## 2019-12-29 LAB — PROTIME-INR
INR: 1.2 (ref 0.8–1.2)
Prothrombin Time: 15.4 seconds — ABNORMAL HIGH (ref 11.4–15.2)

## 2019-12-29 LAB — MAGNESIUM: Magnesium: 3 mg/dL — ABNORMAL HIGH (ref 1.7–2.4)

## 2019-12-29 LAB — CBG MONITORING, ED: Glucose-Capillary: 143 mg/dL — ABNORMAL HIGH (ref 70–99)

## 2019-12-29 LAB — MRSA PCR SCREENING: MRSA by PCR: NEGATIVE

## 2019-12-29 LAB — APTT: aPTT: 31 seconds (ref 24–36)

## 2019-12-29 MED ORDER — METOPROLOL TARTRATE 5 MG/5ML IV SOLN
INTRAVENOUS | Status: AC
  Administered 2019-12-29: 5 mg via INTRAVENOUS
  Filled 2019-12-29: qty 5

## 2019-12-29 MED ORDER — DEXTROSE 50 % IV SOLN: 1.0000 | Freq: Once | INTRAVENOUS | Status: DC

## 2019-12-29 MED ORDER — BISACODYL 5 MG PO TBEC: 5.0000 mg | DELAYED_RELEASE_TABLET | Freq: Every evening | ORAL | Status: DC | PRN

## 2019-12-29 MED ORDER — CALCIUM GLUCONATE-NACL 1-0.675 GM/50ML-% IV SOLN
1.0000 g | Freq: Once | INTRAVENOUS | Status: AC
  Administered 2019-12-29: 06:00:00 1000 mg via INTRAVENOUS
  Filled 2019-12-29: qty 50

## 2019-12-29 MED ORDER — ONDANSETRON HCL 4 MG/2ML IJ SOLN
4.0000 mg | Freq: Three times a day (TID) | INTRAMUSCULAR | Status: DC | PRN
  Administered 2019-12-29 – 2019-12-30 (×2): 4 mg via INTRAVENOUS
  Filled 2019-12-29 (×3): qty 2

## 2019-12-29 MED ORDER — HEPARIN SODIUM (PORCINE) 1000 UNIT/ML DIALYSIS: 2000.0000 [IU] | INTRAMUSCULAR | Status: DC | PRN

## 2019-12-29 MED ORDER — INSULIN ASPART 100 UNIT/ML IV SOLN: 5.0000 [IU] | Freq: Once | INTRAVENOUS | Status: DC

## 2019-12-29 MED ORDER — CHLORHEXIDINE GLUCONATE CLOTH 2 % EX PADS: 6.0000 | MEDICATED_PAD | Freq: Every day | CUTANEOUS | Status: DC

## 2019-12-29 MED ORDER — DEXTROSE 50 % IV SOLN
1.0000 | Freq: Once | INTRAVENOUS | Status: AC
  Administered 2019-12-29: 50 mL via INTRAVENOUS
  Filled 2019-12-29: qty 50

## 2019-12-29 MED ORDER — HEPARIN SODIUM (PORCINE) 1000 UNIT/ML IJ SOLN
INTRAMUSCULAR | Status: AC
  Administered 2019-12-29: 2000 [IU] via INTRAVENOUS_CENTRAL
  Filled 2019-12-29: qty 4

## 2019-12-29 MED ORDER — MYCOPHENOLATE MOFETIL 250 MG PO CAPS
500.0000 mg | ORAL_CAPSULE | Freq: Two times a day (BID) | ORAL | Status: DC
  Administered 2019-12-29 – 2020-01-02 (×9): 500 mg via ORAL
  Filled 2019-12-29 (×13): qty 2

## 2019-12-29 MED ORDER — TACROLIMUS 1 MG PO CAPS
2.0000 mg | ORAL_CAPSULE | Freq: Two times a day (BID) | ORAL | Status: DC
  Administered 2019-12-29 – 2020-01-02 (×9): 2 mg via ORAL
  Filled 2019-12-29 (×15): qty 2

## 2019-12-29 MED ORDER — LORAZEPAM 2 MG/ML IJ SOLN
0.5000 mg | Freq: Once | INTRAMUSCULAR | Status: AC
  Administered 2019-12-29: 02:00:00 0.5 mg via INTRAVENOUS
  Filled 2019-12-29: qty 1

## 2019-12-29 MED ORDER — SEVELAMER CARBONATE 800 MG PO TABS
1600.0000 mg | ORAL_TABLET | Freq: Three times a day (TID) | ORAL | Status: DC
  Administered 2019-12-29 – 2020-01-03 (×8): 1600 mg via ORAL
  Filled 2019-12-29 (×12): qty 2

## 2019-12-29 MED ORDER — SODIUM ZIRCONIUM CYCLOSILICATE 10 G PO PACK
10.0000 g | PACK | Freq: Once | ORAL | Status: AC
  Administered 2019-12-29: 10 g via ORAL
  Filled 2019-12-29: qty 1

## 2019-12-29 MED ORDER — DEXTROSE 50 % IV SOLN: 1.0000 | INTRAVENOUS | Status: DC | PRN

## 2019-12-29 MED ORDER — CALCIUM GLUCONATE 10 % IV SOLN: 1.0000 g | Freq: Once | INTRAVENOUS | Status: DC

## 2019-12-29 MED ORDER — INSULIN ASPART 100 UNIT/ML ~~LOC~~ SOLN: 0.0000 [IU] | Freq: Three times a day (TID) | SUBCUTANEOUS | Status: DC

## 2019-12-29 MED ORDER — INSULIN ASPART 100 UNIT/ML IV SOLN
5.0000 [IU] | Freq: Once | INTRAVENOUS | Status: AC
  Administered 2019-12-29: 5 [IU] via INTRAVENOUS

## 2019-12-29 MED ORDER — HYDROMORPHONE HCL 1 MG/ML IJ SOLN
0.5000 mg | Freq: Once | INTRAMUSCULAR | Status: AC
  Administered 2019-12-29: 04:00:00 0.5 mg via INTRAVENOUS
  Filled 2019-12-29: qty 1

## 2019-12-29 MED ORDER — SODIUM BICARBONATE 8.4 % IV SOLN
50.0000 meq | Freq: Once | INTRAVENOUS | Status: AC
  Administered 2019-12-29: 04:00:00 50 meq via INTRAVENOUS
  Filled 2019-12-29: qty 50

## 2019-12-29 MED ORDER — HEPARIN SODIUM (PORCINE) 5000 UNIT/ML IJ SOLN
5000.0000 [IU] | Freq: Three times a day (TID) | INTRAMUSCULAR | Status: DC
  Filled 2019-12-29 (×5): qty 1

## 2019-12-29 MED ORDER — CALCIUM GLUCONATE-NACL 1-0.675 GM/50ML-% IV SOLN: 1.0000 g | Freq: Once | INTRAVENOUS | Status: DC

## 2019-12-29 MED ORDER — CALCIUM GLUCONATE 10 % IV SOLN
1.0000 g | Freq: Once | INTRAVENOUS | Status: AC
  Administered 2019-12-29: 1 g via INTRAVENOUS
  Filled 2019-12-29: qty 10

## 2019-12-29 MED ORDER — OXYCODONE-ACETAMINOPHEN 5-325 MG PO TABS
1.0000 | ORAL_TABLET | Freq: Once | ORAL | Status: AC
  Administered 2019-12-29: 23:00:00 1 via ORAL
  Filled 2019-12-29: qty 1

## 2019-12-29 MED ORDER — METOPROLOL TARTRATE 5 MG/5ML IV SOLN: 5.0000 mg | Freq: Once | INTRAVENOUS | Status: AC

## 2019-12-29 MED ORDER — CHLORHEXIDINE GLUCONATE CLOTH 2 % EX PADS
6.0000 | MEDICATED_PAD | Freq: Every day | CUTANEOUS | Status: DC
  Administered 2019-12-30 – 2020-01-03 (×5): 6 via TOPICAL

## 2019-12-29 MED ORDER — RAMELTEON 8 MG PO TABS
8.0000 mg | ORAL_TABLET | Freq: Every day | ORAL | Status: DC
  Administered 2019-12-29 – 2020-01-02 (×5): 8 mg via ORAL
  Filled 2019-12-29 (×6): qty 1

## 2019-12-29 MED ORDER — HEPARIN SODIUM (PORCINE) 5000 UNIT/ML IJ SOLN: 5000.0000 [IU] | Freq: Three times a day (TID) | INTRAMUSCULAR | Status: DC

## 2019-12-29 MED ORDER — AMLODIPINE BESYLATE 10 MG PO TABS
10.0000 mg | ORAL_TABLET | Freq: Every day | ORAL | Status: DC
  Administered 2019-12-29 – 2020-01-03 (×4): 10 mg via ORAL
  Filled 2019-12-29 (×5): qty 1

## 2019-12-29 NOTE — Consult Note (Signed)
Renal Service Consult Note [facility]  [place] 12/29/2019 Sol Blazing Requesting Physician:  Dr Leonette Monarch  Reason for Consult:  Hyperkalemia, ESRD HPI: The patient is a 24 y.o. year-old w/ hx of ESRD sp renal tx done in [place] (failed in May 2020), moved to [place] last year. Was not doing well on PD so TDC was recently placed about 5 days ago. Pt came to ED last night not feeling well, c/o fever, fatigue, gen'd weakness and pain at site of HD cath recently placed in R chest.  Labs returned K+ 7.5, BUN 212, creat 45, HCO3 < 7, plt 89, Hb 7.2, wbc 10K. EKG w/ QRS 120, no bradycardia, NSR 88 bpm. CXR clear. Asked to see for hyperkalemia and ESRD.    Pt seen in room. Pt is lethargic but Ox 3 and answering questions appropriately.  Main c/o's are fatigue and R upper chest pain where HD cath recently placed. No SOB, abd pain, no cough , hemoptysis.  No n/v. +migraine headaches.   ROS  denies CP  no joint pain   no HA  no blurry vision  no rash  no diarrhea  no nausea/ vomiting     Past Medical History  Past Medical History:  Diagnosis Date  . Anemia   . Hypertension   . Renal disorder    Past Surgical History  Past Surgical History:  Procedure Laterality Date  . NEPHRECTOMY TRANSPLANTED ORGAN     2007   Family History No family history on file. Social History  reports that she has never smoked. She has never used smokeless tobacco. She reports previous alcohol use. She reports that she does not use drugs. Allergies  Allergies  Allergen Reactions  . Nsaids Anaphylaxis and Other (See Comments)    Due to ESRD   . Grapefruit Extract Other (See Comments)    Per pt: can't have because of medications  . Other     Other reaction(s): Unknown   Home medications Prior to Admission medications   Medication Sig Start Date End Date Taking? Authorizing Provider  amLODipine (NORVASC) 10 MG tablet Take 1 tablet (10 mg total) by mouth daily. Patient not  taking: Reported on 11/27/2019 11/15/19 02/13/20  Jose Persia, MD  amLODipine (NORVASC) 10 MG tablet Please take 1 tablet by mouth daily 11/15/19   Jose Persia, MD  CLARITIN 10 MG tablet Take 10 mg by mouth daily as needed for allergies.  09/18/19   [provider]  diphenhydrAMINE (BENADRYL) 25 mg capsule Take 25 mg by mouth as needed for allergies or sleep.    [provider]  DULCOLAX 5 MG EC tablet Take 5 mg by mouth at bedtime as needed for mild constipation.  10/08/19   [provider]  famotidine (PEPCID) 20 MG tablet Take 20 mg by mouth at bedtime.    [provider]  gentamicin cream (GARAMYCIN) 0.1 % Apply 1 application topically daily.  09/03/19   [provider]  isosorbide-hydrALAZINE (BIDIL) 20-37.5 MG tablet Take 2 tablets by mouth 3 (three) times daily. 10/26/19   [provider]  labetalol (NORMODYNE) 200 MG tablet Take 1 tablet (200 mg total) by mouth 3 (three) times daily. 10/26/19 11/27/19  [name], MD  lactulose (CHRONULAC) 10 GM/15ML solution Take 30 mLs by mouth as needed for mild constipation.  10/08/19   [provider]  losartan (COZAAR) 50 MG tablet Take 1 tablet (50 mg total) by mouth daily. 10/26/19 11/27/19  [name], MD  MIRALAX 17 GM/SCOOP powder Take 1 Container by mouth as needed for mild constipation.  10/08/19   [provider]  mycophenolate (CELLCEPT) 500 MG tablet Take 1,000 mg by mouth 2 (two) times daily. 03/21/19   [provider]  NAC 600 600 MG CAPS Take 600 mg by mouth daily as needed.  09/18/19   [provider]  ramelteon (ROZEREM) 8 MG tablet Take 1 tablet (8 mg total) by mouth at bedtime. 10/26/19 11/25/19  [name], MD  sevelamer carbonate (RENVELA) 800 MG tablet Take 1,600 mg by mouth 3 (three) times daily with meals.    [provider]  tacrolimus (PROGRAF) 1 MG capsule Take 3 mg by mouth 2 (two) times daily. 3 tabs in the AM and 3 tabs in the evening  09/04/19   [provider]     Vitals:   12/28/19 2144 12/29/19 0025 12/29/19 0332 12/29/19 0400  BP: (!) 157/105  (!) 136/110 (!) 158/115  Pulse: 85  93 89  Resp: 16  19 17   Temp: 98.6 F (37 C) 97.6 F (36.4 C)    TempSrc: Oral Oral    SpO2: 100%  98% 100%  Weight: 54.4 kg     Height: 5' (1.524 m)      Exam Gen appears fatigued, slightly lethargic, mild slurred speech Not in distress, lying flat No rash, cyanosis or gangrene Sclera anicteric, throat clear  No jvd or bruits Chest clear bilat to bases no rales, wheezing or bronchial BS RRR no MRG, loud precordium Abd soft ntnd no mass or ascites +bs , PD cath lower abd GU defer MS no joint effusions or deformity Ext no sig LE or UE edema, no wounds or ulcers Neuro is as above, nonfocal    Home meds:  - prograf 3 mg bid/ cellcept 1gm bid  - norvasc 10/ bidil 20-37.5 take 2 bid/ losartan 50 qd/ labetalol 200 tid  - pepcid/ renvela ac 2/ prn's/ vitamins   Assessment/ Plan: 1. Hyperkalemia - severe, slight QRS widening, getting temporizing measures IV in ED. Hemodynamically stable, no bradycardia.  Recently had TDC placed for transition to HD but has not had HD yet. Plan HD this am after COVID test results back.  ^^BUN/ creat so will need gradual ^ in HD intensity.  2. ESRD - failed PD recently, transitioning to HD as above. HD today and again tomorrow.  3. HTN/vol- no vol overload, CXR clear, BP's up. UF 1-2 L today.  4. 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 Met acidosis - will improve w/ HD 6. Anemia - prob CKD related. Transfuse prn. Check fe/ tibc.       Kelly Splinter  MD 12/29/2019, 4:07 AM  Recent Labs  Lab 12/29/19 0239  WBC 10.1  HGB 7.2*   Recent Labs  Lab 12/29/19 0116  K >7.5*  BUN 212*  CREATININE 45.04*  CALCIUM 7.7*

## 2019-12-29 NOTE — ED Notes (Signed)
This RN attempted IV access, unsuccessfully. PA Mina at bedside, IV team consulted for blood draw & IV access.

## 2019-12-29 NOTE — ED Provider Notes (Signed)
[facility] EMERGENCY DEPARTMENT Provider Note   CSN: [id number] Arrival date & time: 12/28/19  2051     History Chief Complaint  Patient presents with  . Weakness  . Fever    Tara Mills is a 24 y.o. 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  Significant other at the bedside helps provide history.  He reports that she had a tunneled dialysis catheter placed to the right chest on 12/23/2019 and since then she has had constant and somewhat worsening pain around the insertion site.  She reports that the pain worsens when she turns her head to the left.  Today she noticed ecchymosis to her breasts but denies significant pain to the breasts.  She has had several episodes of nonbloody nonbilious emesis since the catheter was placed and reports she has not been able to keep down many of her home medications.  She has noted feeling generally weak and has had chills.  No known sick contacts or Covid exposures.  Tara Mills has noted her temperatures have been low around 95 F orally.  He has also noted her to be confused, lethargic.  She is not currently vaccinated against Covid.  She has been compliant with peritoneal dialysis which she does at home daily.  She had a recent admission to the hospital on 11/27/2019, signed out AMA and was subsequently admitted at [facility] for second opinion regarding need for hemodialysis versus peritoneal dialysis.  At her admission at [facility] nephrology was consulted and recommended transitioning from hemodialysis to peritoneal dialysis.  She states she is hesitant to move forward with hemodialysis.  The history is provided by the patient, a significant  other and medical records.       Past Medical History:  Diagnosis Date  . Anemia   . Hypertension   . Renal disorder     Patient Active Problem List   Diagnosis Date Noted  . ESRD needing dialysis ([place]) 12/29/2019  . Symptomatic anemia 11/27/2019  . Volume overload 11/27/2019  . ESRD on peritoneal dialysis ([place]) 11/27/2019  . (HFpEF) heart failure with preserved ejection fraction ([place]) 11/27/2019  . Hyperphosphatemia 11/27/2019  . Thrombocytopathia ([place]) 11/27/2019  . Acute on chronic anemia 11/14/2019  . 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 NEPHRECTOMY TRANSPLANTED ORGAN     2007     OB History   No obstetric history on file.     No family history on file.  Social History   Tobacco Use  . Smoking status: Never Smoker  . Smokeless tobacco: Never Used  Substance Use Topics  . Alcohol use: Not Currently  . Drug use: Never    Home Medications Prior to Admission medications   Medication Sig Start Date End Date Taking? Authorizing Provider  amLODipine (NORVASC) 10 MG tablet Take 1 tablet (10 mg total) by mouth daily. Patient not taking: Reported on 11/27/2019 11/15/19 02/13/20  Jose Persia, MD  amLODipine (NORVASC) 10 MG tablet Please take 1 tablet by mouth daily 11/15/19   Jose Persia, MD  CLARITIN 10 MG tablet Take 10 mg by mouth daily as needed for allergies.  09/18/19   [provider]  diphenhydrAMINE (BENADRYL) 25 mg capsule Take 25 mg by mouth as needed for  allergies or sleep.    [provider]  DULCOLAX 5 MG EC tablet Take 5 mg by mouth at bedtime as needed for mild constipation.  10/08/19   [provider]  famotidine (PEPCID) 20 MG tablet Take 20 mg by mouth at bedtime.    [provider]  gentamicin cream (GARAMYCIN) 0.1 % Apply 1 application topically daily.  09/03/19   [provider]  isosorbide-hydrALAZINE (BIDIL) 20-37.5 MG tablet Take 2 tablets by  mouth 3 (three) times daily. 10/26/19   [provider]  labetalol (NORMODYNE) 200 MG tablet Take 1 tablet (200 mg total) by mouth 3 (three) times daily. 10/26/19 11/27/19  [name], MD  lactulose (CHRONULAC) 10 GM/15ML solution Take 30 mLs by mouth as needed for mild constipation.  10/08/19   [provider]  losartan (COZAAR) 50 MG tablet Take 1 tablet (50 mg total) by mouth daily. 10/26/19 11/27/19  [name], MD  MIRALAX 17 GM/SCOOP powder Take 1 Container by mouth as needed for mild constipation.  10/08/19   [provider]  mycophenolate (CELLCEPT) 500 MG tablet Take 1,000 mg by mouth 2 (two) times daily. 03/21/19   [provider]  NAC 600 600 MG CAPS Take 600 mg by mouth daily as needed.  09/18/19   [provider]  ramelteon (ROZEREM) 8 MG tablet Take 1 tablet (8 mg total) by mouth at bedtime. 10/26/19 11/25/19  [name], MD  sevelamer carbonate (RENVELA) 800 MG tablet Take 1,600 mg by mouth 3 (three) times daily with meals.    [provider]  tacrolimus (PROGRAF) 1 MG capsule Take 3 mg by mouth 2 (two) times daily. 3 tabs in the AM and 3 tabs in the evening 09/04/19   [provider]    Allergies    Nsaids, Grapefruit extract, and Other  Review of Systems   Review of Systems  Constitutional: Positive for chills. Negative for fever.  Gastrointestinal: Positive for nausea and vomiting.  Skin: Positive for color change.  Neurological: Positive for weakness (generlized).  All other systems reviewed and are negative.   Physical Exam Updated Vital Signs BP (!) 163/111   Pulse 95   Temp 97.6 F (36.4 C) (Oral)   Resp 18   Ht 5' (1.524 m)   Wt 54.4 kg   LMP 12/02/2019   SpO2 98%   BMI 23.44 kg/m   Physical Exam Vitals and nursing note reviewed.  Constitutional:      General: She is not in acute distress.    Appearance: She is well-developed.     Comments: Chronically ill in appearance  HENT:     Head:  Normocephalic and atraumatic.  Eyes:     General:        Right eye: No discharge.        Left eye: No discharge.     Conjunctiva/sclera: Conjunctivae normal.     Pupils: Pupils are equal, round, and reactive to light.  Neck:     Vascular: No JVD.     Trachea: No tracheal deviation.  Cardiovascular:     Rate and Rhythm: Normal rate and regular rhythm.     Comments: Examination performed in the presence of a chaperone.  TDC to the right side of the chest with surrounding scabbing but no surrounding erythema or induration.  Ecchymosis noted to the bilateral breasts in the inferior position Pulmonary:     Effort: Pulmonary effort is normal.     Breath sounds: Normal breath sounds.  Abdominal:     General: Bowel sounds are normal. There is no distension.     Palpations: Abdomen is soft.     Tenderness: There is no abdominal tenderness. There is no guarding or rebound.     Comments: PD catheter in place  Musculoskeletal:     Cervical back: Neck supple.  Skin:    General: Skin is warm and dry.     Coloration: Skin is pale.     Findings: No erythema.  Neurological:     Mental Status: She is alert.     Comments: Drowsy but easily arousable.  Thinks that the month is November.  Moves extremities spontaneously without difficulty.  No facial droop noted.  Psychiatric:        Behavior: Behavior normal.     ED Results / Procedures / Treatments   Labs (all labs ordered are listed, but only abnormal results are displayed) Labs Reviewed  COMPREHENSIVE METABOLIC PANEL - Abnormal; Notable for the following components:      Result Value   Sodium 134 (*)    Potassium >7.5 (*)    Chloride 97 (*)    CO2 <7 (*)    BUN 212 (*)    Creatinine, Ser 45.04 (*)    Calcium 7.7 (*)    Total Protein 6.0 (*)    Albumin 3.1 (*)    AST 12 (*)    GFR calc non Af Amer 1 (*)    GFR calc Af Amer 1 (*)    All other components within normal limits  PROTIME-INR - Abnormal; Notable for the following  components:   Prothrombin Time 15.4 (*)    All other components within normal limits  CBC WITH DIFFERENTIAL/PLATELET - Abnormal; Notable for the following components:   RBC 2.40 (*)    Hemoglobin 7.2 (*)    HCT 23.3 (*)    RDW 17.7 (*)    Platelets 89 (*)    Neutro Abs 7.9 (*)    Abs Immature Granulocytes 0.29 (*)    All other components within normal limits  I-STAT BETA HCG BLOOD, ED ([facility], [facility], [facility] ONLY) - Abnormal; Notable for the following components:   I-stat hCG, quantitative 8.3 (*)    All other components within normal limits  CBG MONITORING, ED - Abnormal; Notable for the following components:   Glucose-Capillary 143 (*)    All other components within normal limits  RESPIRATORY PANEL BY RT PCR (FLU A&B, COVID)  CULTURE, BLOOD (ROUTINE X 2)  CULTURE, BLOOD (ROUTINE X 2)  LACTIC ACID, PLASMA  APTT  LACTIC ACID, PLASMA  CBC WITH DIFFERENTIAL/PLATELET  URINALYSIS, ROUTINE W REFLEX MICROSCOPIC  PHOSPHORUS  MAGNESIUM  IRON AND TIBC  TYPE AND SCREEN    EKG EKG Interpretation  Date/Time:  Sunday December 29 2019 01:47:01 EDT Ventricular Rate:  89 PR Interval:    QRS Duration: 124 QT Interval:  417 QTC Calculation: 508 R Axis:   82 Text Interpretation: Sinus rhythm Prolonged PR interval Probable left atrial enlargement Nonspecific intraventricular conduction delay NO STEMI. Otherwise no significant change Confirmed by Addison Lank [phone]) on 12/29/2019 2:50:47 AM   Radiology DG Chest 2 View  Result Date: 12/28/2019 CLINICAL DATA:  Weakness and fever. EXAM: CHEST - 2 VIEW COMPARISON:  November 27, 2019 FINDINGS: A right-sided venous catheter is seen with its distal tip noted within the right atrium. This represents a new finding when compared to the prior study. There is no evidence of acute infiltrate, pleural effusion or pneumothorax.  The cardiac silhouette is markedly enlarged and unchanged in size. The visualized skeletal structures are unremarkable. IMPRESSION: 1. Interval  right internal jugular venous catheter placement and positioning, as described above, when compared to the prior study dated November 27, 2019. 2. Stable cardiomegaly without acute or active cardiopulmonary disease. Electronically Signed   By: Virgina Norfolk M.D.   On: 12/28/2019 22:42    Procedures .Critical Care Performed by: Renita Papa, PA-C Authorized by: Renita Papa, PA-C   Critical care provider statement:    Critical care time (minutes):  45   Critical care was necessary to treat or prevent imminent or life-threatening deterioration of the following conditions:  Metabolic crisis   Critical care was time spent personally by me on the following activities:  Discussions with consultants, evaluation of patient's response to treatment, examination of patient, ordering and performing treatments and interventions, ordering and review of laboratory studies, ordering and review of radiographic studies, pulse oximetry, re-evaluation of patient's condition, obtaining history from patient or surrogate and review of old charts   (including critical care time)  Medications Ordered in ED Medications  Chlorhexidine Gluconate Cloth 2 % PADS 6 each (has no administration in time range)  Chlorhexidine Gluconate Cloth 2 % PADS 6 each (has no administration in time range)  amLODipine (NORVASC) tablet 10 mg (has no administration in time range)  ramelteon (ROZEREM) tablet 8 mg (has no administration in time range)  sevelamer carbonate (RENVELA) tablet 1,600 mg (has no administration in time range)  tacrolimus (PROGRAF) capsule 2 mg (has no administration in time range)  mycophenolate (CELLCEPT) capsule 500 mg (has no administration in time range)  bisacodyl (DULCOLAX) EC tablet 5 mg (has no administration in time range)  heparin injection 5,000 Units (has no administration in time range)  insulin aspart (novoLOG) injection 0-9 Units (has no administration in time range)  insulin aspart (novoLOG)  injection 5 Units (has no administration in time range)    And  dextrose 50 % solution 50 mL (has no administration in time range)  calcium gluconate 1 g/ 50 mL sodium chloride IVPB (has no administration in time range)  LORazepam (ATIVAN) injection 0.5 mg (0.5 mg Intravenous Given 12/29/19 0203)  sodium zirconium cyclosilicate (LOKELMA) packet 10 g (10 g Oral Given 12/29/19 0318)  calcium gluconate inj 10% (1 g) URGENT USE ONLY! (1 g Intravenous Given 12/29/19 0318)  insulin aspart (novoLOG) injection 5 Units (5 Units Intravenous Given 12/29/19 0319)    And  dextrose 50 % solution 50 mL (50 mLs Intravenous Given 12/29/19 0318)  sodium bicarbonate injection 50 mEq (50 mEq Intravenous Given 12/29/19 0344)  HYDROmorphone (DILAUDID) injection 0.5 mg (0.5 mg Intravenous Given 12/29/19 0355)  calcium gluconate 1 g/ 50 mL sodium chloride IVPB (0 g Intravenous Stopped 12/29/19 4742)    ED Course  I have reviewed the triage vital signs and the nursing notes.  Pertinent labs & imaging results that were available during my care of the patient were reviewed by me and considered in my medical decision making (see chart for details).    MDM Rules/Calculators/A&P                      Patient presenting for evaluation of subjective fevers and chills, fatigue, nausea and vomiting.  Symptoms began after placement of a TDC.  She is afebrile in the ED, persistently hypertensive.  She is drowsy, confused.  No focal neurologic deficits however.  Her catheter site does not appear  infected.  Chest x-ray shows no acute cardiopulmonary abnormalities.  Lab work is significant for hyperkalemia with potassium greater than 7.5, creatinine 45.04, BUN to 12 though she reports compliance with peritoneal dialysis.  EKG shows peaked T waves, mildly widened QRS.  She was given Lokelma, calcium gluconate, insulin and dextrose in the ED.  CONSULT: Spoke with Dr. Jonnie Finner with nephrology who will see the patient emergently in the ED,  plan for emergent hemodialysis.  Internal medicine teaching service to admit patient.  Patient seen and evaluated by Dr. Leonette Monarch who agrees with assessment and plan at this time.   Final Clinical Impression(s) / ED Diagnoses Final diagnoses:  ESRD on dialysis [facility])  Acute hyperkalemia  Uremia    Rx / DC Orders ED Discharge Orders    None       Renita Papa, PA-C 12/29/19 0750    Fatima Blank, MD 12/30/19 [phone]

## 2019-12-29 NOTE — Procedures (Signed)
   I was present at this dialysis session, have reviewed the session itself and made  appropriate changes Kelly Splinter MD [place] pager [phone]   12/29/2019, 5:57 PM

## 2019-12-29 NOTE — Progress Notes (Signed)
PHARMACY - PHYSICIAN COMMUNICATION CRITICAL VALUE ALERT - BLOOD CULTURE IDENTIFICATION (BCID)  Results for orders placed or performed during the hospital encounter of 12/28/19  Blood Culture ID Panel (Reflexed) (Collected: 12/29/2019  1:16 AM)  Result Value Ref Range   Enterococcus species NOT DETECTED NOT DETECTED   Listeria monocytogenes NOT DETECTED NOT DETECTED   Staphylococcus species DETECTED (A) NOT DETECTED   Staphylococcus aureus (BCID) NOT DETECTED NOT DETECTED   Methicillin resistance DETECTED (A) NOT DETECTED   Streptococcus species NOT DETECTED NOT DETECTED   Streptococcus agalactiae NOT DETECTED NOT DETECTED   Streptococcus pneumoniae NOT DETECTED NOT DETECTED   Streptococcus pyogenes NOT DETECTED NOT DETECTED   Acinetobacter baumannii NOT DETECTED NOT DETECTED   Enterobacteriaceae species NOT DETECTED NOT DETECTED   Enterobacter cloacae complex NOT DETECTED NOT DETECTED   Escherichia coli NOT DETECTED NOT DETECTED   Klebsiella oxytoca NOT DETECTED NOT DETECTED   Klebsiella pneumoniae NOT DETECTED NOT DETECTED   Proteus species NOT DETECTED NOT DETECTED   Serratia marcescens NOT DETECTED NOT DETECTED   Haemophilus influenzae NOT DETECTED NOT DETECTED   Neisseria meningitidis NOT DETECTED NOT DETECTED   Pseudomonas aeruginosa NOT DETECTED NOT DETECTED   Candida albicans NOT DETECTED NOT DETECTED   Candida glabrata NOT DETECTED NOT DETECTED   Candida krusei NOT DETECTED NOT DETECTED   Candida parapsilosis NOT DETECTED NOT DETECTED   Candida tropicalis NOT DETECTED NOT DETECTED    Name of physician (or Provider) Contacted: none  Changes to prescribed antibiotics required:  Bcx 1/4 GPC probable contaminant afebrile wbc wnl   Bonnita Nasuti Pharm.D. CPP, BCPS Clinical Pharmacist [phone] 12/29/2019 9:04 PM

## 2019-12-29 NOTE — ED Notes (Signed)
Nephrology MD [place] at bedside, advised staff & pt that dialysis would occur around 0600-0700.

## 2019-12-29 NOTE — ED Notes (Signed)
Pt resting comfortably in bed, VSS, NAD noted. No needs expressed at this time.

## 2019-12-29 NOTE — ED Notes (Signed)
Pt was transported to hemodialysis.

## 2019-12-29 NOTE — H&P (Signed)
Date: 12/29/2019               Patient Name:  Tara Mills MRN: [id number]  DOB: 1995/11/20 Age / Sex: 24 y.o., female   PCP: [place] Service: Internal Medicine Teaching Service         Attending Physician: Dr. Rebeca Alert, Raynaldo Opitz, MD    First Contact: Dr. Court Joy Pager: [phone]  Second Contact: Dr. 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 Pt sleeping and lethargic at the time of examination. Arouses to answer questions briefly before falling back asleep. Endorses pain over R chest at [facility] site. Pt unable to provide significant history despite multiple attempts to arouse pt. Remainder of history provided per chart review.  Pt presented for generalized weakness, fever, and pain at [facility] site. Had recent admission last month for symptomatic anemia. During that time, pt refused HD access or HD despite volume overload while on PD. Pt continued on PD outpatient after leaving AMA. However, TDC placed on 4/19 outpatient given PD failure. Last PD treatment 4/23 per pt. Not received HD outpatient yet. Pt having constant pain at [facility] insertion site exacerbated by moving her head. In the ED, lab work significant for K >7.5, BUN 212, Cr 45, and bicarb < 7. EKG without peaked T waves, bradycardia (HR 89), or widened QRS. CXR with stable cardiomegaly and no evidence of pulmonary edema. Lactate normal. CBC with Hgb 7.2, WBC 10.1, and plts 89. Denies chest pain, shortness of breath, headache, nausea, vomiting, diarrhea, or abdominal pain. Nephrology  consulted for HD, and IMTS called for admission.   Meds: No current facility-administered medications on file prior to encounter.   Current Outpatient Medications on File Prior to Encounter  Medication Sig Dispense Refill  . amLODipine (NORVASC) 10 MG tablet Take 1 tablet (10 mg total) by mouth daily. (Patient not taking: Reported on 11/27/2019) 90 tablet 0  . amLODipine (NORVASC) 10 MG tablet Please take 1 tablet by mouth daily 10 tablet 0  . CLARITIN 10 MG tablet Take 10 mg by mouth daily as needed for allergies.     . diphenhydrAMINE (BENADRYL) 25 mg capsule Take 25 mg by mouth as needed for allergies or sleep.    . DULCOLAX 5 MG EC tablet Take 5 mg by mouth at bedtime as needed for mild constipation.     . famotidine (PEPCID) 20 MG tablet Take 20 mg by mouth at bedtime.    Marland Kitchen gentamicin cream (GARAMYCIN) 0.1 % Apply 1 application topically daily.     . isosorbide-hydrALAZINE (BIDIL) 20-37.5 MG tablet Take 2 tablets by mouth 3 (three) times daily.    Marland Kitchen labetalol (NORMODYNE) 200 MG tablet Take 1 tablet (200 mg total) by mouth 3 (three) times daily. 90 tablet 0  . lactulose (CHRONULAC) 10 GM/15ML solution Take 30 mLs by mouth as needed for mild constipation.     Marland Kitchen losartan (COZAAR) 50 MG tablet Take 1 tablet (50 mg total) by mouth daily. 30 tablet 0  . MIRALAX  17 GM/SCOOP powder Take 1 Container by mouth as needed for mild constipation.     . mycophenolate (CELLCEPT) 500 MG tablet Take 1,000 mg by mouth 2 (two) times daily.    Marland Kitchen NAC 600 600 MG CAPS Take 600 mg by mouth daily as needed.     . ramelteon (ROZEREM) 8 MG tablet Take 1 tablet (8 mg total) by mouth at bedtime. 30 tablet 0  . sevelamer carbonate (RENVELA) 800 MG tablet Take 1,600 mg by mouth 3 (three) times daily with meals.    . tacrolimus (PROGRAF) 1 MG capsule Take 3 mg by mouth 2 (two) times daily. 3 tabs in the AM and 3 tabs in the evening     Allergies: Allergies as of 12/28/2019 - Review Complete 12/28/2019  Allergen  Reaction Noted  . Nsaids Anaphylaxis and Other (See Comments) 04/22/2019  . Grapefruit extract Other (See Comments) 10/18/2019  . Other  11/07/2019   Past Medical History:  Diagnosis Date  . Anemia   . Hypertension   . Renal disorder    Family History: Per chart review, pt without any significant family history (including ESRD, anemia, parathyroid, or thyroid disease).  Social History:  Pt lives at home in [place] with her fiance.  Social History   Tobacco Use  . Smoking status: Never Smoker  . Smokeless tobacco: Never Used  Substance Use Topics  . Alcohol use: Not Currently  . Drug use: Never   Review of Systems: A complete ROS was negative except as per HPI.  Physical Exam: Blood pressure (!) 168/115, pulse 82, temperature 97.6 F (36.4 C), temperature source Oral, resp. rate 16, height 5' (1.524 m), weight 54.4 kg, last menstrual period 12/02/2019, SpO2 98 %. Physical Exam Vitals and nursing note reviewed.  Constitutional:      General: She is sleeping. She is not in acute distress.    Appearance: She is not ill-appearing, toxic-appearing or diaphoretic.  HENT:     Head: Normocephalic and atraumatic.     Mouth/Throat:     Mouth: Mucous membranes are dry.  Eyes:     Conjunctiva/sclera: Conjunctivae normal.  Cardiovascular:     Rate and Rhythm: Normal rate and regular rhythm.     Heart sounds: Normal heart sounds.  Pulmonary:     Effort: Pulmonary effort is normal. No respiratory distress.     Breath sounds: Normal breath sounds. No wheezing, rhonchi or rales.     Comments: Transmitted upper airway sounds present, lungs clear bilaterally Abdominal:     General: Abdomen is flat. Bowel sounds are normal. There is no distension.     Palpations: Abdomen is soft.     Tenderness: There is no abdominal tenderness.  Musculoskeletal:        General: No deformity. Normal range of motion.     Right lower leg: No edema.     Left lower leg: No edema.  Skin:    General:  Skin is warm and dry.  Neurological:     Mental Status: She is lethargic.     Comments: Pt lethargic. Answers questions appropriately before falling back asleep. Moving all extremities spontaneously. No gross focal deficits.   Psychiatric:        Mood and Affect: Mood normal.    EKG: personally reviewed my interpretation is normal rate, sinus rhythm, prolonged PR interval, and no peaked T waves  CXR: personally reviewed my interpretation is cardiomegaly, right IJ catheter with tip in right atrium, no focal opacities, no pleural effusions,  no pneumothorax, and no bony abnormalities  Assessment & Plan by Problem: Active Problems:   ESRD needing dialysis [facility])  Ms. 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 Has been failing PD at home recently, so a [facility] was placed on 4/19 though pt had not yet begun HD. Admission labs significant for hyperkalemia without EKG changes. BUN significantly elevated to 212 with Cr 45. Suspect pt's lethargy secondary to uremia. No evidence of volume overload on examination or CXR. Pt with normal respiratory effort and O2 saturation >95% on room air. Nephrology consulted, appreciate their involvement in this case - temporizing measures for hyperK in ED - HD this morning and tomorrow - UF 1-2L today - continue home prograf and cellcept  Chronic Normocytic Anemia Thrombocytopenia Hgb 7.2 and plts 89 on admission, previously Hgb 6.7 and plts 95 last month. Was admitted last month for symptomatic anemia. Pt follows with hem at [facility] and last seen on 2/24, where bone marrow  biopsy was being considered. Thought anemia concerning for shortened red cell survival or hemolysis. Though anemia of chronic disease secondary to CKD certainly contributing. - transfuse PRN - ordered iron and TIBC  Hyperglycemia Pt without diabetes diagnosis, though persistent hyperglycemia with CBG readings over 200 last admission. - add sensitive SSI - CBG monitoring  Diet - renal diet with 1228mL fluid restriction  Fluids - none DVT ppx - heparin 5,000 units subQ q8h CODE STATUS - FULL CODE  Dispo: Admit patient to Inpatient with expected length of stay greater than 2 midnights.  Signed: Ladona Horns, MD 12/29/2019, 5:54 AM  Pager: [phone]

## 2019-12-29 NOTE — Progress Notes (Signed)
   Subjective:  Tara Mills is a 24 y.o. with PMH of chronic anemia requiring transfusions, HTN, HFpEF, congenital renal dysplasia status post failed renal transplant in 01/2019 now ESRD on PD transitioning to HD admit for generalized weakness in setting uremia on hospital day 0  Patient seen in ED and is lethargic.  She is arousable and oriented. Going to HD this morning.   Objective:  Vital signs in last 24 hours: Vitals:   12/29/19 1000 12/29/19 1015 12/29/19 1038 12/29/19 1057  BP: (!) 149/110 (!) 152/95 (!) 171/109   Pulse: 92 75 90   Resp:  18 18   Temp:  97.6 F (36.4 C) (!) 97.4 F (36.3 C)   TempSrc:  Axillary Axillary   SpO2:  98% 99%   Weight:    57 kg  Height:        General: appears chronically ill, lethargic Cardiovascular: Normal rate, regular rhythm.  No murmurs, rubs, or gallops Pulmonary : Effort normal, breath sounds normal. No wheezes, rales, or rhonchi Abdominal: soft, nontender,  bowel sounds present  Assessment/Plan:  Active Problems:   ESRD needing dialysis (Tara Mills)  Tara Mills PMH of chronic anemia requiring transfusions, HTN, HFpEF, 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  Hyperkalemia ESRD on PD transitioning to HD BUN to 12 and creatinine 45.  Patient lethargic on exam and nephrology has been consulted with plans for HD.  QT is prolonged on monitor.  Calcium gluconate given.  Plan: - HD - Nephrology consulted, follow recommendations  Chronic normocytic anemia Thrombocytopenia Noted hemoglobin 7.2 and platelets in remission, previously hemoglobin 6.7 and platelets 95 last month. Thought likely due for an red cell survival or secondary to CKD. Iron 166.  Plan: -Diffuse for hemoglobin less than 7  HTN BP 171/109. Likely to improve with HD.  Plan: - Continue amlodipine  Hyperglycemia History of hyper  glycemia on previous admissions and blood glucose 200 on this admission.  Episode of hypoglycemia this morning. Will continue to monitor. -SSI -CBG monitoring   DVT prophx: Heparin Diet: Diet with 1200 mL fluid restriction Bowel: N/A Code: Full  Dispo: Anticipated discharge in approximately 1-2 day(s).   [name], MD PGY1

## 2019-12-29 NOTE — ED Provider Notes (Signed)
Attestation: Medical screening examination/treatment/procedure(s) were conducted as a shared visit with non-physician practitioner(s) and myself.  I personally evaluated the patient during the encounter.   Briefly, the patient is a 24 y.o. female with h/o renal dysplasia status post renal transplant complicated by renal failure and ESRD on peritoneal dialysis who presents for generalized fatigue, nausea, emesis for 6 days with confusion.   Vitals:   12/28/19 2144 12/29/19 0025  BP: (!) 157/105   Pulse: 85   Resp: 16   Temp: 98.6 F (37 C) 97.6 F (36.4 C)  SpO2: 100%     CONSTITUTIONAL: Chronically ill-appearing-appearing, NAD NEURO:  Alert and disoriented EYES:  pupils equal and reactive ENT/NECK:  trachea midline, no JVD CARDIO: Tacky rate, regular rhythm, well-perfused PULM: None labored breathing GI/GU:  Abdomin non-distended.  Peritoneal dialysis catheter in place abdomen nontender. MSK/SPINE:  No gross deformities, no edema SKIN:  no rash, ecchymosis to right upper chest with PermCath in right upper chest.  No surrounding erythema or induration.     EKG Interpretation  Date/Time:  Sunday December 29 2019 01:47:01 EDT Ventricular Rate:  89 PR Interval:    QRS Duration: 124 QT Interval:  417 QTC Calculation: 508 R Axis:   82 Text Interpretation: Sinus rhythm Prolonged PR interval Probable left atrial enlargement Nonspecific intraventricular conduction delay NO STEMI. Otherwise no significant change Confirmed by Addison Lank [id number]) on 12/29/2019 2:50:47 AM       Work up consistent with renal failure resulting in hyperkalemia.  EKG with peaked T waves and mildly worsened QRS widening.  Confusion likely from uremia given elevated BUN.    Temporizing measures initiated.  Nephrology consulted for emergent dialysis.  Internal medicine admission.  CRITICAL CARE Performed by: Grayce Sessions Mikalyn Hermida Total critical care time: 15 minutes Critical care time was exclusive of  separately billable procedures and treating other patients. Critical care was necessary to treat or prevent imminent or life-threatening deterioration. Critical care was time spent personally by me on the following activities: development of treatment plan with patient and/or surrogate as well as nursing, discussions with consultants, evaluation of patient's response to treatment, examination of patient, obtaining history from patient or surrogate, ordering and performing treatments and interventions, ordering and review of laboratory studies, ordering and review of radiographic studies, pulse oximetry and re-evaluation of patient's condition.      Fatima Blank, MD 12/30/19 [phone]

## 2019-12-29 NOTE — Progress Notes (Signed)
Hypoglycemic Event  CBG: 65  Treatment: 4 oz juice/soda, crackers  Symptoms: Hungry  Follow-up CBG: Time: 1208 CBG Result: 62  Possible Reasons for Event: Vomiting and Inadequate meal intake  Comments/MD notified: Dr. Burnett Kanaris

## 2019-12-30 DIAGNOSIS — E875 Hyperkalemia: Secondary | ICD-10-CM

## 2019-12-30 DIAGNOSIS — I509 Heart failure, unspecified: Secondary | ICD-10-CM

## 2019-12-30 DIAGNOSIS — A4902 Methicillin resistant Staphylococcus aureus infection, unspecified site: Secondary | ICD-10-CM

## 2019-12-30 DIAGNOSIS — R7881 Bacteremia: Secondary | ICD-10-CM

## 2019-12-30 DIAGNOSIS — N186 End stage renal disease: Secondary | ICD-10-CM

## 2019-12-30 DIAGNOSIS — I132 Hypertensive heart and chronic kidney disease with heart failure and with stage 5 chronic kidney disease, or end stage renal disease: Secondary | ICD-10-CM

## 2019-12-30 DIAGNOSIS — T82898A Other specified complication of vascular prosthetic devices, implants and grafts, initial encounter: Secondary | ICD-10-CM

## 2019-12-30 DIAGNOSIS — Z992 Dependence on renal dialysis: Secondary | ICD-10-CM

## 2019-12-30 LAB — CBC
HCT: 19.2 % — ABNORMAL LOW (ref 36.0–46.0)
Hemoglobin: 6.2 g/dL — CL (ref 12.0–15.0)
MCH: 29.5 pg (ref 26.0–34.0)
MCHC: 32.3 g/dL (ref 30.0–36.0)
MCV: 91.4 fL (ref 80.0–100.0)
Platelets: 83 10*3/uL — ABNORMAL LOW (ref 150–400)
RBC: 2.1 MIL/uL — ABNORMAL LOW (ref 3.87–5.11)
RDW: 17.2 % — ABNORMAL HIGH (ref 11.5–15.5)
WBC: 5.3 10*3/uL (ref 4.0–10.5)
nRBC: 0 % (ref 0.0–0.2)

## 2019-12-30 LAB — HEPATIC FUNCTION PANEL
ALT: 15 U/L (ref 0–44)
AST: 16 U/L (ref 15–41)
Albumin: 2.5 g/dL — ABNORMAL LOW (ref 3.5–5.0)
Alkaline Phosphatase: 107 U/L (ref 38–126)
Bilirubin, Direct: <0.1 mg/dL (ref 0.0–0.2)
Total Bilirubin: 0.6 mg/dL (ref 0.3–1.2)
Total Protein: 5.4 g/dL — ABNORMAL LOW (ref 6.5–8.1)

## 2019-12-30 LAB — PREPARE RBC (CROSSMATCH)

## 2019-12-30 LAB — HEPATITIS B SURFACE ANTIGEN: Hepatitis B Surface Ag: NONREACTIVE

## 2019-12-30 LAB — RENAL FUNCTION PANEL
Albumin: 2.5 g/dL — ABNORMAL LOW (ref 3.5–5.0)
Anion gap: 19 — ABNORMAL HIGH (ref 5–15)
BUN: 102 mg/dL — ABNORMAL HIGH (ref 6–20)
CO2: 19 mmol/L — ABNORMAL LOW (ref 22–32)
Calcium: 7.5 mg/dL — ABNORMAL LOW (ref 8.9–10.3)
Chloride: 100 mmol/L (ref 98–111)
Creatinine, Ser: 24.3 mg/dL — ABNORMAL HIGH (ref 0.44–1.00)
GFR calc Af Amer: 2 mL/min — ABNORMAL LOW (ref >60)
GFR calc non Af Amer: 2 mL/min — ABNORMAL LOW (ref >60)
Glucose, Bld: 103 mg/dL — ABNORMAL HIGH (ref 70–99)
Phosphorus: >30 mg/dL — ABNORMAL HIGH (ref 2.5–4.6)
Potassium: 4.4 mmol/L (ref 3.5–5.1)
Sodium: 138 mmol/L (ref 135–145)

## 2019-12-30 LAB — PROTIME-INR
INR: 1.2 (ref 0.8–1.2)
Prothrombin Time: 15 seconds (ref 11.4–15.2)

## 2019-12-30 LAB — PHOSPHORUS: Phosphorus: >30 mg/dL — ABNORMAL HIGH (ref 2.5–4.6)

## 2019-12-30 LAB — GLUCOSE, CAPILLARY
Glucose-Capillary: 100 mg/dL — ABNORMAL HIGH (ref 70–99)
Glucose-Capillary: 90 mg/dL (ref 70–99)
Glucose-Capillary: 75 mg/dL (ref 70–99)
Glucose-Capillary: 128 mg/dL — ABNORMAL HIGH (ref 70–99)

## 2019-12-30 LAB — HEMOGLOBIN AND HEMATOCRIT, BLOOD
HCT: 23.9 % — ABNORMAL LOW (ref 36.0–46.0)
Hemoglobin: 7.9 g/dL — ABNORMAL LOW (ref 12.0–15.0)

## 2019-12-30 MED ORDER — NEPRO/CARBSTEADY PO LIQD
237.0000 mL | Freq: Two times a day (BID) | ORAL | Status: DC
  Administered 2019-12-30: 13:00:00 237 mL via ORAL

## 2019-12-30 MED ORDER — OXYCODONE-ACETAMINOPHEN 5-325 MG PO TABS
1.0000 | ORAL_TABLET | Freq: Once | ORAL | Status: AC
  Administered 2019-12-30: 05:00:00 1 via ORAL
  Filled 2019-12-30: qty 1

## 2019-12-30 MED ORDER — ENSURE ENLIVE PO LIQD: 237.0000 mL | Freq: Two times a day (BID) | ORAL | Status: DC

## 2019-12-30 MED ORDER — ACETAMINOPHEN 325 MG PO TABS
ORAL_TABLET | ORAL | Status: AC
  Filled 2019-12-30: qty 2

## 2019-12-30 MED ORDER — VANCOMYCIN VARIABLE DOSE PER UNSTABLE RENAL FUNCTION (PHARMACIST DOSING): Status: DC

## 2019-12-30 MED ORDER — DARBEPOETIN ALFA 100 MCG/0.5ML IJ SOSY
PREFILLED_SYRINGE | INTRAMUSCULAR | Status: AC
  Administered 2019-12-30: 100 ug via INTRAVENOUS
  Filled 2019-12-30: qty 0.5

## 2019-12-30 MED ORDER — RENA-VITE PO TABS
1.0000 | ORAL_TABLET | Freq: Every day | ORAL | Status: DC
  Administered 2019-12-30 – 2020-01-02 (×4): 1 via ORAL
  Filled 2019-12-30 (×4): qty 1

## 2019-12-30 MED ORDER — HEPARIN SODIUM (PORCINE) 1000 UNIT/ML IJ SOLN
INTRAMUSCULAR | Status: AC
  Administered 2019-12-30: 3200 [IU]
  Filled 2019-12-30: qty 4

## 2019-12-30 MED ORDER — VANCOMYCIN HCL 10 G IV SOLR
1250.0000 mg | Freq: Once | INTRAVENOUS | Status: AC
  Administered 2019-12-30: 1250 mg via INTRAVENOUS
  Filled 2019-12-30 (×2): qty 1250

## 2019-12-30 MED ORDER — SODIUM CHLORIDE 0.9% IV SOLUTION: Freq: Once | INTRAVENOUS | Status: AC

## 2019-12-30 MED ORDER — ACETAMINOPHEN 325 MG PO TABS
650.0000 mg | ORAL_TABLET | Freq: Once | ORAL | Status: AC
  Administered 2019-12-30: 650 mg via ORAL

## 2019-12-30 MED ORDER — DARBEPOETIN ALFA 100 MCG/0.5ML IJ SOSY: 100.0000 ug | PREFILLED_SYRINGE | INTRAMUSCULAR | Status: DC

## 2019-12-30 MED ORDER — OXYCODONE-ACETAMINOPHEN 5-325 MG PO TABS
1.0000 | ORAL_TABLET | Freq: Four times a day (QID) | ORAL | Status: AC | PRN
  Administered 2019-12-30 – 2020-01-01 (×2): 1 via ORAL
  Filled 2019-12-30 (×2): qty 1

## 2019-12-30 NOTE — Progress Notes (Signed)
Pt arrived back from dialysis. CBG check = 75. Pt asymptomatic. 2 4oz apple juices given to patient to drink to maintain/increase current CBG. Pt also reports that boyfriend is bringing her dinner.

## 2019-12-30 NOTE — Progress Notes (Addendum)
Pharmacy Antibiotic Note  Tara Mills is a 24 y.o. female with BCID now showing Staph species with resistance.  Pharmacy has been consulted for Vancomycin dosing. ESRD, hx failed kidney transplant, transitioned from PD to HD; 3 hrs yesterday, BFR 150-250.  Planning HD again today, BFR max 300, then line removal.    Expected to have HD this afternoon. Noted plan to extend from 2.5 hours to longer session.  Plan:  Vancomycin 1250 mg IV x 1 at the end of HD today.  Will follow up for further HD plans and re-dose after next HD.  Follow up final cultures, clinical progress.  Height: 5' (152.4 cm) Weight: 57 kg (125 lb 10.6 oz) IBW/kg (Calculated) : 45.5  Temp (24hrs), Avg:98.3 F (36.8 C), Min:97.8 F (36.6 C), Max:98.7 F (37.1 C)  Recent Labs  Lab 12/29/19 0116 12/29/19 0239 12/29/19 1212 12/30/19 0256  WBC  --  10.1  --  5.3  CREATININE 45.04*  --   --  24.30*  LATICACIDVEN 1.1  --  1.8  --    Allergies  Allergen Reactions  . Nsaids Anaphylaxis and Other (See Comments)    Due to ESRD   . Grapefruit Extract Other (See Comments)    Per pt: can't have because of medications    Antimicrobials this admission:  Vancomycin 4/26>>  Dose adjustments this admission:  n/a  Microbiology results:  4/25 blood x 2: GPC in clusters  4/26 BCID shows Staph species w/ mecA resistance  4/25 MRSA PCR: negative  4/25 COVID and flu: negative  Thank you for allowing pharmacy to be a part of this patient's care.  Arty Baumgartner, [place] Phone: [phone] 12/30/2019 11:55 AM

## 2019-12-30 NOTE — Progress Notes (Addendum)
Renal Addendum:  Note that her BCx have now turned positive for MRSA - will need HD catheter removed after HD today - will talk with her about extending HD time today for better clearance (BUN still very high) and then can go for 2 day line holiday before replacement. Started on Vancomycin.  Veneta Penton, PA-C [facility] Pager [phone]  ADDENDUM (3:04p):  Spoke with patient regarding + BCx. Will extend HD to 3:30hr today. VVS (Dr. Carlis Abbott) saw patient, will plan to remove [facility] 1st thing tomorrow for line holiday. Will cancel HD for tomorrow. Plan to have [place] replaced on Wed or Thursday morning (pending labs) with dialysis afterwards. Did examine her PD catheter as well to assure no signs of infection -- exit site clean without drainage, no tenderness. [place]

## 2019-12-30 NOTE — Progress Notes (Addendum)
Pt c/o shakiness and dizziness. Observed pt shaking in bed. V/s 157/101,108,93%RA,97.9. 4OZ OJ GIVEN AND BS CHECKED 128.  Pt still visibly shaking as well as c/o pain to line sight in upper r chest wall. MD paged.  Pt presently in no visible signs of distress. MD states will check on pt.

## 2019-12-30 NOTE — Social Work (Addendum)
Appointment made at PCP assigned to pt by Medicaid. Appointment made for 5/4 at 1:40pm with Garnetta Buddy, PA. Added to d/c instructions. Following for other pt needs. CSW has also reached out to renal navigator to discuss any additional supports that can be provided to support pt.   Westley Hummer, MSW, [place] Work

## 2019-12-30 NOTE — Progress Notes (Signed)
Initial Nutrition Assessment  DOCUMENTATION CODES:   Non-severe (moderate) malnutrition in context of chronic illness  INTERVENTION:   - Ensure Enlive po BID, each supplement provides 350 kcal and 20 grams of protein  - Renal MVI daily  - Encourage adequate PO intake  NUTRITION DIAGNOSIS:   Moderate Malnutrition related to chronic illness (ESRD, CHF) as evidenced by mild fat depletion, mild muscle depletion, moderate muscle depletion, percent weight loss (21.5% weight loss in 11 months).  GOAL:   Patient will meet greater than or equal to 90% of their needs  MONITOR:   PO intake, Supplement acceptance, Labs, Weight trends, I & O's  REASON FOR ASSESSMENT:   Malnutrition Screening Tool    ASSESSMENT:   24 year old female who presented on 4/25 with weakness and fever. PMH of congenital renal dysplasia, ESRD s/p renal transplant (failed in May 2020), HTN, CHF, anemia. Pt failed PD and is transitioning to HD. Pt admitted with hyperkalemia.   Noted plan for HD again today. Pt's blood cultures have turned positive for MRSA. Plan for [facility] to be removed tomorrow for line holiday. TDC to be replaced later in week with HD afterwards.  HD net UF on 4/25: 618 ml  Spoke with pt at bedside. Pt states that her stomach is upset but declines emesis bag. RD noted ~25% completed lunch meal tray at bedside. Pt states that this is more than what she typically eats at a meal. Pt reports that she does not eat full meals but just has a few bites of each food. Pt may have a snack between meals. Pt was drinking Boost supplements per the recommendation of her dialysis RD but ran out. Pt does not like Nepro. RD will order Ensure Enlive during admission.  Pt reports her UBW as 120 lbs and states her weight goes "up and down." Pt states she has not noted any significant weight loss. Per review of weight readings from previous encounters, pt has experienced a 15.6 kg weight loss over the last 11 months. This  is a 21.5% weight loss which is severe and significant for timeframe.  Meal Completion: 50% x 3 recorded meals  Medications reviewed and include: Nepro BID, SSI, Renvela, IV abx  Labs reviewed: phosphorus >30.0, hemoglobin 7.9 CBG's: 73-125 x 24 hours  NUTRITION - FOCUSED PHYSICAL EXAM:    Most Recent Value  Orbital Region  Mild depletion  Upper Arm Region  Mild depletion  Thoracic and Lumbar Region  No depletion  Buccal Region  No depletion  Temple Region  Moderate depletion  Clavicle Bone Region  Moderate depletion  Clavicle and Acromion Bone Region  Moderate depletion  Scapular Bone Region  Mild depletion  Dorsal Hand  Mild depletion  Patellar Region  Mild depletion  Anterior Thigh Region  Mild depletion  Posterior Calf Region  Mild depletion  Edema (RD Assessment)  Mild [generalized]  Hair  Reviewed  Eyes  Reviewed  Mouth  Reviewed  Skin  Reviewed  Nails  Reviewed       Diet Order:   Diet Order            Diet renal with fluid restriction Fluid restriction: 1200 mL Fluid; Room service appropriate? Yes; Fluid consistency: Thin  Diet effective now              EDUCATION NEEDS:   Education needs have been addressed  Skin:  Skin Assessment: Reviewed RN Assessment  Last BM:  12/29/19 type 6  Height:   Ht Readings from  Last 1 Encounters:  12/28/19 5' (1.524 m)    Weight:   Wt Readings from Last 1 Encounters:  12/29/19 57 kg    Ideal Body Weight:  45.5 kg  BMI:  Body mass index is 24.54 kg/m.  Estimated Nutritional Needs:   Kcal:  2000-2200  Protein:  90-105 grams  Fluid:  1000 ml + UOP    Gaynell Face, MS, RD, LDN Inpatient Clinical Dietitian Pager: [phone] Weekend/After Hours: [phone]

## 2019-12-30 NOTE — Progress Notes (Signed)
PHARMACY - PHYSICIAN COMMUNICATION CRITICAL VALUE ALERT - BLOOD CULTURE IDENTIFICATION (BCID)  Results for orders placed or performed during the hospital encounter of 12/28/19  Blood Culture ID Panel (Reflexed) (Collected: 12/29/2019  1:16 AM)  Result Value Ref Range   Enterococcus species NOT DETECTED NOT DETECTED   Listeria monocytogenes NOT DETECTED NOT DETECTED   Staphylococcus species DETECTED (A) NOT DETECTED   Staphylococcus aureus (BCID) NOT DETECTED NOT DETECTED   Methicillin resistance DETECTED (A) NOT DETECTED   Streptococcus species NOT DETECTED NOT DETECTED   Streptococcus agalactiae NOT DETECTED NOT DETECTED   Streptococcus pneumoniae NOT DETECTED NOT DETECTED   Streptococcus pyogenes NOT DETECTED NOT DETECTED   Acinetobacter baumannii NOT DETECTED NOT DETECTED   Enterobacteriaceae species NOT DETECTED NOT DETECTED   Enterobacter cloacae complex NOT DETECTED NOT DETECTED   Escherichia coli NOT DETECTED NOT DETECTED   Klebsiella oxytoca NOT DETECTED NOT DETECTED   Klebsiella pneumoniae NOT DETECTED NOT DETECTED   Proteus species NOT DETECTED NOT DETECTED   Serratia marcescens NOT DETECTED NOT DETECTED   Haemophilus influenzae NOT DETECTED NOT DETECTED   Neisseria meningitidis NOT DETECTED NOT DETECTED   Pseudomonas aeruginosa NOT DETECTED NOT DETECTED   Candida albicans NOT DETECTED NOT DETECTED   Candida glabrata NOT DETECTED NOT DETECTED   Candida krusei NOT DETECTED NOT DETECTED   Candida parapsilosis NOT DETECTED NOT DETECTED   Candida tropicalis NOT DETECTED NOT DETECTED   Name of physician (or Provider) Contacted: Dr. Madalyn Rob  Changes to prescribed antibiotics required: Bcx 3/4 GPCs; recommend starting vancomycin per pharmacy  Kennon Holter, PharmD PGY1 Ambulatory Care Pharmacy Resident 12/30/2019  10:48 AM

## 2019-12-30 NOTE — Progress Notes (Signed)
Subjective:  Melat Wrisley is a 24 y.o. 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 She has been having pain around her HD cath site, which she had placed at [facility] and was informed it is not functioning properly. She also express noticing significant bruising of her chest. She mentions feeling somewhat improved compared to yesterday but not completely back to normal self. Discussed plan to consult vascular team to assess her tunneled cath to see if it can be adjusted.   Objective:  Vital signs in last 24 hours: Vitals:   12/29/19 1837 12/29/19 2333 12/30/19 0415 12/30/19 0435  BP: (!) 152/94 138/89 (!) 146/103 (!) 155/107  Pulse: 89 98 98 100  Resp: 20 18 18 18   Temp: 98.7 F (37.1 C) 98.1 F (36.7 C) 97.8 F (36.6 C) 98.2 F (36.8 C)  TempSrc: Oral Oral Oral Oral  SpO2: 99% 100% 100% 97%  Weight:      Height:        General: NAD , nl appearance Cardiovascular: Normal rate, regular rhythm.  No murmurs, rubs, or gallops.  Chest: Bilateral bruising on breast. R chest with TDC, tender to palpation Pulmonary : Effort normal, breath sounds normal. No wheezes, rales, or rhonchi Abdominal: soft, nontender,  bowel sounds present Ext: warm extremities, no lower extremity edema   CBC Latest Ref Rng & Units 12/30/2019 12/29/2019 11/28/2019  WBC 4.0 - 10.5 K/uL 5.3 10.1 6.8  Hemoglobin 12.0 - 15.0 g/dL 6.2(LL) 7.2(L) 6.7(LL)  Hematocrit 36.0 - 46.0 % 19.2(L) 23.3(L) 19.6(L)  Platelets 150 - 400 K/uL 83(L) 89(L) 95(L)   BMP Latest Ref Rng & Units 12/30/2019 12/29/2019 11/28/2019  Glucose 70 - 99 mg/dL 103(H) 73 89  BUN 6 - 20 mg/dL 102(H) 212(H) 115(H)  Creatinine 0.44 - 1.00 mg/dL 24.30(H) 45.04(H) 24.87(H)  Sodium 135 - 145 mmol/L  138 134(L) 140  Potassium 3.5 - 5.1 mmol/L 4.4 >7.5(HH) 3.7  Chloride 98 - 111 mmol/L 100 97(L) 102  CO2 22 - 32 mmol/L 19(L) <7(L) 17(L)  Calcium 8.9 - 10.3 mg/dL 7.5(L) 7.7(L) 7.2(L)      Assessment/Plan:  Principal Problem:   ESRD needing dialysis ([place]) Active Problems:   Acute on chronic anemia   (HFpEF) heart failure with preserved ejection fraction (HCC)   Hyperphosphatemia   Hyperkalemia  Rhylei Tep PMH of chronic anemia requiring transfusions, HTN, HFpEF, 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  ESRD on PD transitioning to HD HD yesterday.BUN 102, CR 24, phosphorus greater than 30. Plan for HD again today.  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 Patient reports she just noticed. Plan: - HD - Nephrology consulted, follow recommendations  Bacteremia Patient with new tunnel cath and reports on going tenderness. Blood cultures initially 1/4 positive, but now 3/4 positive for MRSA.  ID should be auto consulted, greatly appreciate their assistance Plan - Consult vascular surgery to evaluate tunnel cath - See ID recommendations  - Vancomycin  per pharmacy  Chronic normocytic anemia Thrombocytopenia Chronic anemia and thrombocytopenia.  Patient follows with outpatient hematologist.  Likely secondary to end-stage renal disease, but undergoing further work-up with hematology.  Overnight hemoglobin 6.2, improved with 1 unit PRBC to 7.9. Likleyexaggerated response.   Plan: - Trend CBC - Diffuse for hemoglobin less than 7  HTN BP improving with HD.  Continue to monitor. Plan: - Continue amlodipine  DVT prophx: Heparin Diet: Diet with 1200 mL fluid restriction Bowel: N/A Code: Full  Dispo: Anticipated discharge in approximately 1-2 day(s).   [name], MD PGY1

## 2019-12-30 NOTE — Progress Notes (Signed)
[facility] Progress Note   Subjective:   Seen in room - ongoing nausea, no CP/dyspnea.  Objective Vitals:   12/29/19 2333 12/30/19 0415 12/30/19 0435 12/30/19 0700  BP: 138/89 (!) 146/103 (!) 155/107 127/82  Pulse: 98 98 100 (!) 109  Resp: 18 18 18 18   Temp: 98.1 F (36.7 C) 97.8 F (36.6 C) 98.2 F (36.8 C) 98.7 F (37.1 C)  TempSrc: Oral Oral Oral Oral  SpO2: 100% 100% 97% 94%  Weight:      Height:       Physical Exam General: Well appearing, young woman, NAD Heart: RRR; no murmur Lungs: CTA anteriorly Abdomen: soft, non-tender Extremities: No LE edema Dialysis Access: TDC in R chest - c/o tenderness  Additional Objective Labs: Basic Metabolic Panel: Recent Labs  Lab 12/29/19 0116 12/29/19 0816 12/30/19 0256  NA 134*  --  138  K >7.5*  --  4.4  CL 97*  --  100  CO2 <7*  --  19*  GLUCOSE 73  --  103*  BUN 212*  --  102*  CREATININE 45.04*  --  24.30*  CALCIUM 7.7*  --  7.5*  PHOS  --  >30.0* >30.0*   Liver Function Tests: Recent Labs  Lab 12/29/19 0116 12/30/19 0256  AST 12*  --   ALT 12  --   ALKPHOS 123  --   BILITOT 0.8  --   PROT 6.0*  --   ALBUMIN 3.1* 2.5*   CBC: Recent Labs  Lab 12/29/19 0239 12/30/19 0256  WBC 10.1 5.3  NEUTROABS 7.9*  --   HGB 7.2* 6.2*  HCT 23.3* 19.2*  MCV 97.1 91.4  PLT 89* 83*   CBG: Recent Labs  Lab 12/29/19 1207 12/29/19 1256 12/29/19 1627 12/29/19 2208 12/30/19 0534  GLUCAP 62* 87 73 125* 100*   Iron Studies:  Recent Labs    12/29/19 0816  IRON 166  TIBC 186*   Studies/Results: DG Chest 2 View  Result Date: 12/28/2019 CLINICAL DATA:  Weakness and fever. EXAM: CHEST - 2 VIEW COMPARISON:  November 27, 2019 FINDINGS: A right-sided venous catheter is seen with its distal tip noted within the right atrium. This represents a new finding when compared to the prior study. There is no evidence of acute infiltrate, pleural effusion or pneumothorax. The cardiac silhouette is markedly  enlarged and unchanged in size. The visualized skeletal structures are unremarkable. IMPRESSION: 1. Interval right internal jugular venous catheter placement and positioning, as described above, when compared to the prior study dated November 27, 2019. 2. Stable cardiomegaly without acute or active cardiopulmonary disease. Electronically Signed   By: Virgina Norfolk M.D.   On: 12/28/2019 22:42   Medications:  . sodium chloride   Intravenous Once  . amLODipine  10 mg Oral Daily  . Chlorhexidine Gluconate Cloth  6 each Topical Q0600  . heparin  5,000 Units Subcutaneous Q8H  . insulin aspart  0-9 Units Subcutaneous TID WC  . mycophenolate  500 mg Oral BID  . ramelteon  8 mg Oral QHS  . sevelamer carbonate  1,600 mg Oral TID WC  . tacrolimus  2 mg Oral BID    Dialysis Orders: Re-establishing HD orders after "failing" PD  Assessment/Plan: 1. Hyperkalemia: Severe with EKG changes on admit, s/p temporizing measures in ED then urgent HD overnight. K improved today. 2. ESRD: Hx failed kidney Tx, still on immunosuppressants. Failed PD recently, now changing back to HD. BUN > 200 on admit. S/p HD  4/25 - will need another HD today, ordered. 3. HTN/volume:  CXR clear - volume less of an issue. 4. Anemia: Hgb 6.2 - given 1U PRBCs this morning. Tsat 89% (high) - avoid IV iron. Will give Aranesp today. 5. Secondary hyperparathyroidism: CorrCa ok, Phos remains sky high. Should come down with HD and binders. 6. Nutrition: Alb low, will add Nepro supplements.  Veneta Penton, PA-C 12/30/2019, 9:26 AM  [employer]

## 2019-12-30 NOTE — TOC Initial Note (Signed)
Transition of Care [facility]) - Initial/Assessment Note    Patient Details  Name: Tara Mills MRN: [id number] Date of Birth: 02/05/1996  Transition of [facility]) CM/SW Contact:    Marilu Favre, RN Phone Number: 12/30/2019, 1:50 PM  Clinical Narrative:                  Patient from home with boyfriend. See prior TOC note. PCP appointment made.   Patient states she has transportation to appointments and dialysis .   Cell [phone]  Expected Discharge Plan: Home/Self Care Barriers to Discharge: Continued Medical Work up   Patient Goals and CMS Choice Patient states their goals for this hospitalization and ongoing recovery are:: to return to home CMS Medicare.gov Compare Post Acute Care list provided to:: Patient Choice offered to / list presented to : Patient  Expected Discharge Plan and Services Expected Discharge Plan: Home/Self Care   Discharge Planning Services: NA   Living arrangements for the past 2 months: Single Family Home                 DME Arranged: N/A DME Agency: NA       HH Arranged: NA          Prior Living Arrangements/Services Living arrangements for the past 2 months: Single Family Home Lives with:: Significant Other Patient language and need for interpreter reviewed:: Yes Do you feel safe going back to the place where you live?: Yes      Need for Family Participation in Patient Care: Yes (Comment) Care giver support system in place?: Yes (comment)   Criminal Activity/Legal Involvement Pertinent to Current Situation/Hospitalization: No - Comment as needed  Activities of Daily Living Home Assistive Devices/Equipment: None ADL Screening (condition at time of admission) Patient's cognitive ability adequate to safely complete daily activities?: Yes Is the patient deaf or have difficulty hearing?: No Does the patient have difficulty seeing, even when wearing glasses/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) Does the patient have difficulty  concentrating, remembering, or making decisions?: Yes Patient able to express need for assistance with ADLs?: Yes Does the patient have difficulty dressing or bathing?: Yes Independently performs ADLs?: No Communication: Independent Dressing (OT): Needs assistance Is this a change from baseline?: Pre-admission baseline Grooming: Independent Feeding: Independent Bathing: Needs assistance Is this a change from baseline?: Pre-admission baseline Toileting: Needs assistance Is this a change from baseline?: Pre-admission baseline In/Out Bed: Independent Walks in Home: Independent Does the patient have difficulty walking or climbing stairs?: Yes Weakness of Legs: Both Weakness of Arms/Hands: None  Permission Sought/Granted   Permission granted to share information with : No              Emotional Assessment Appearance:: Appears stated age Attitude/Demeanor/Rapport: Engaged Affect (typically observed): Accepting Orientation: : Oriented to Self, Oriented to Place, Oriented to  Time, Oriented to Situation Alcohol / Substance Use: Not Applicable Psych Involvement: No (comment)  Admission diagnosis:  Acute hyperkalemia [E87.5] Uremia [N19] ESRD on dialysis ([place]) [N18.6, Z99.2] ESRD needing dialysis ([place]) [N18.6, Z99.2] Patient Active Problem List   Diagnosis Date Noted  . ESRD needing dialysis ([place]) 12/29/2019  . Hyperkalemia 12/29/2019  . Symptomatic anemia 11/27/2019  . Volume overload 11/27/2019  . ESRD on peritoneal dialysis ([place]) 11/27/2019  . (HFpEF) heart failure with preserved ejection fraction ([place]) 11/27/2019  . Hyperphosphatemia 11/27/2019  . Thrombocytopathia ([place]) 11/27/2019  . Acute on chronic anemia 11/14/2019  . Vision loss, bilateral 10/21/2019   PCP:  [place]:  [id number] [place], [place] [address] [address] Phone: [phone] Fax: [place], [place] [address] [place] [address] Phone: [phone] Fax: [phone]     Social Determinants of Health (SDOH) Interventions    Readmission Risk Interventions No flowsheet data found.

## 2019-12-30 NOTE — Consult Note (Addendum)
Hospital Consult    Reason for Consult:  Removal of painful [facility] Requesting Physician:  Dr. Madalyn Rob MRN #:  [id number]  History of Present Illness: This is a 24 y.o. 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 She reports that she first began hemodialysis 1 year ago in May 2020. She originally was on HD via a right IJ TDC and transitioned to PD but states that she changed dialysis centers and as a result has to go back to Hemodialysis. Her most recent [facility] was placed on 12/23/19. She says since they she has had significant pain in the area of the catheter with sharp pains and pain when she moves her neck or if she coughs or sneezes. Of note patient has not been compliant with her dialysis treatment and presented to the ED with generalized weakness and fever. Her Hgb 6.2 on admission, BUN 212, Scr 45.04, blood cultures positive for MRSA. She has been started on Vancomycin. Vascular has been asked to see her for removal of her Tunneled dialysis catheter  Past Medical History:  Diagnosis Date  . Anemia   . Hypertension   . Renal disorder     Past Surgical History:  Procedure Laterality Date  . NEPHRECTOMY TRANSPLANTED ORGAN     2007    Allergies  Allergen Reactions  . Nsaids Anaphylaxis and Other (See Comments)    Due to ESRD   . Grapefruit Extract Other (See Comments)    Per pt: can't have because of medications    Prior to Admission medications   Medication Sig Start Date End Date Taking? Authorizing Provider  cinacalcet (SENSIPAR) 30 MG tablet Take 30 mg by mouth daily. 12/02/19  Yes [provider]  CLARITIN 10 MG tablet Take 10 mg by mouth daily as needed for allergies.  09/18/19  Yes [provider]  diphenhydrAMINE (BENADRYL) 25 mg capsule Take 25 mg by mouth every 6 (six) hours as needed for allergies or sleep.    Yes [provider]  famotidine (PEPCID) 20 MG tablet Take  20 mg by mouth at bedtime.   Yes [provider]  gentamicin cream (GARAMYCIN) 0.1 % Apply 1 application topically daily.  09/03/19  Yes [provider]  isosorbide-hydrALAZINE (BIDIL) 20-37.5 MG tablet Take 2 tablets by mouth 3 (three) times daily. 10/26/19  Yes [provider]  labetalol (NORMODYNE) 200 MG tablet Take 1 tablet (200 mg total) by mouth 3 (three) times daily. 10/26/19 12/30/19 Yes Aslam, Loralyn Freshwater, MD  lactulose (CHRONULAC) 10 GM/15ML solution Take 30 mLs by mouth as needed for mild constipation.  10/08/19  Yes [provider]  losartan (COZAAR) 100 MG tablet Take 100 mg by mouth at bedtime. 12/06/19  Yes [provider]  MIRALAX 17 GM/SCOOP powder Take 17 g by mouth daily as needed for moderate constipation.  10/08/19  Yes [provider]  mycophenolate (CELLCEPT) 500 MG tablet Take 1,000 mg by mouth 2 (two) times daily. 03/21/19  Yes [provider]  NAC 600 600 MG CAPS Take 600 mg by mouth daily as needed (shortness of breath).  09/18/19  Yes [provider]  sevelamer carbonate (RENVELA) 800 MG tablet Take 1,600 mg by mouth 3 (three) times daily with meals.   Yes [provider]  tacrolimus (PROGRAF) 1 MG capsule Take 2 mg by mouth 2 (two) times daily.  09/04/19  Yes [provider]  amLODipine (NORVASC) 10  MG tablet Please take 1 tablet by mouth daily Patient not taking: Reported on 12/30/2019 11/15/19   Jose Persia, MD  ramelteon (ROZEREM) 8 MG tablet Take 1 tablet (8 mg total) by mouth at bedtime. 10/26/19 11/25/19  [name], MD    Social History   Socioeconomic History  . Marital status: Single    Spouse name: Not on file  . Number of children: 0  . Years of education: Not on file  . Highest education level: Not on file  Occupational History  . Not on file  Tobacco Use  . Smoking status: Never Smoker  . Smokeless tobacco: Never Used  Substance and Sexual Activity  . Alcohol use: Not  Currently  . Drug use: Never  . Sexual activity: Not on file  Other Topics Concern  . Not on file  Social History Narrative  . Not on file   Social Determinants of Health   Financial Resource Strain:   . Difficulty of Paying Living Expenses:   Food Insecurity:   . Worried About Charity fundraiser in the Last Year:   . Arboriculturist in the Last Year:   Transportation Needs:   . Film/video editor (Medical):   Marland Kitchen Lack of Transportation (Non-Medical):   Physical Activity:   . Days of Exercise per Week:   . Minutes of Exercise per Session:   Stress:   . Feeling of Stress :   Social Connections:   . Frequency of Communication with Friends and Family:   . Frequency of Social Gatherings with Friends and Family:   . Attends Religious Services:   . Active Member of Clubs or Organizations:   . Attends Archivist Meetings:   Marland Kitchen Marital Status:   Intimate Partner Violence:   . Fear of Current or Ex-Partner:   . Emotionally Abused:   Marland Kitchen Physically Abused:   . Sexually Abused:      History reviewed. No pertinent family history.  ROS: Otherwise negative unless mentioned in HPI  Physical Examination  Vitals:   12/30/19 1415 12/30/19 1430  BP: (!) 152/93 (!) 141/76  Pulse: 96 95  Resp:    Temp:    SpO2:     Body mass index is 24.54 kg/m.  General:  WDWN in NAD HENT: WNL, normocephalic Pulmonary: normal non-labored breathing Cardiac: regular, right IJ TDC that is clean, dry and intact without erythema or swelling, tender to manipulation of catheter or palpation of surrounding tissue Abdomen: soft, NT/ND, no masses. Has PD catheter that is intact Skin: without rashes Musculoskeletal: no muscle wasting or atrophy  Neurologic: A&O X 3;  No focal weakness detected; speech is fluent/normal Psychiatric:  The pt has Normal affect.  CBC    Component Value Date/Time   WBC 5.3 12/30/2019 0256   RBC 2.10 (L) 12/30/2019 0256   HGB 7.9 (L) 12/30/2019 0855   HCT  23.9 (L) 12/30/2019 0855   PLT 83 (L) 12/30/2019 0256   MCV 91.4 12/30/2019 0256   MCH 29.5 12/30/2019 0256   MCHC 32.3 12/30/2019 0256   RDW 17.2 (H) 12/30/2019 0256   LYMPHSABS 1.0 12/29/2019 0239   MONOABS 0.8 12/29/2019 0239   EOSABS 0.1 12/29/2019 0239   BASOSABS 0.1 12/29/2019 0239    BMET    Component Value Date/Time   NA 138 12/30/2019 0256   K 4.4 12/30/2019 0256   CL 100 12/30/2019 0256   CO2 19 (L) 12/30/2019 0256   GLUCOSE 103 (H) 12/30/2019 0256  BUN 102 (H) 12/30/2019 0256   CREATININE 24.30 (H) 12/30/2019 0256   CALCIUM 7.5 (L) 12/30/2019 0256   GFRNONAA 2 (L) 12/30/2019 0256   GFRAA 2 (L) 12/30/2019 0256    COAGS: Lab Results  Component Value Date   INR 1.2 12/30/2019   INR 1.2 12/29/2019   INR 1.2 10/21/2019      Statin:  No. Beta Blocker:  No. Aspirin:  No. ACEI:  No. ARB:  No. CCB use:  Yes Other antiplatelets/anticoagulants:  No.    ASSESSMENT/PLAN: This is a 24 y.o. female who is ESRD on hemodialysis via right IJ TDC. This was placed on 12/23/19 and has been causing her pain since placement. She additionally has positive blood cultures for MRSA. She is receiving a prolonged dialysis treatment this evening. Plan will be to have her [facility] removed tomorrow morning  DVT prophylaxis: Sq Heparin   Karoline Caldwell PA-C [facility] [phone] 12/30/2019  2:59 PM   I have seen and evaluated the patient. I agree with the PA note as documented above.  24 year old female with end-stage renal disease and hx of failed kidney transplant that vascular surgery was initially consulted to evaluate pain at her tunneled dialysis catheter site where she has a catheter in the right IJ.  We have since been asked to actually remove the catheter given positive blood cultures.  Unclear exactly where this was placed but I think [facility] vascular as an outpatient in talking with the patient.  Patient just started dialysis this afternoon around 3:00 and needs  a prolonged session this evening.  I discussed with nephrology and we will plan to remove her catheter in the morning for a line holiday.  Marty Heck, MD [facility] [place] Office: [phone]

## 2019-12-30 NOTE — Plan of Care (Signed)
  Problem: Education: Goal: Knowledge of General Education information will improve Description: Including pain rating scale, medication(s)/side effects and non-pharmacologic comfort measures Outcome: Progressing   Problem: Health Behavior/Discharge Planning: Goal: Ability to manage health-related needs will improve Outcome: Progressing   Problem: Clinical Measurements: Goal: Diagnostic test results will improve Outcome: Progressing Goal: Respiratory complications will improve Outcome: Progressing Goal: Cardiovascular complication will be avoided Outcome: Progressing   Problem: Nutrition: Goal: Adequate nutrition will be maintained Outcome: Progressing   Problem: Coping: Goal: Level of anxiety will decrease Outcome: Progressing   Problem: Elimination: Goal: Will not experience complications related to bowel motility Outcome: Progressing Goal: Will not experience complications related to urinary retention Outcome: Progressing   Problem: Pain Managment: Goal: General experience of comfort will improve Outcome: Progressing   Problem: Safety: Goal: Ability to remain free from injury will improve Outcome: Progressing   Problem: Skin Integrity: Goal: Risk for impaired skin integrity will decrease Outcome: Progressing   Problem: Clinical Measurements: Goal: Ability to maintain clinical measurements within normal limits will improve Outcome: Not Progressing Note: Received blood this AM due to hgb 6.2. Pending recheck post infusion   Problem: Activity: Goal: Risk for activity intolerance will decrease Outcome: Not Progressing Note: Nausea and dizziness inhibits patient's mobility. Receives zofran q8

## 2019-12-31 ENCOUNTER — Inpatient Hospital Stay (HOSPITAL_COMMUNITY): Payer: Medicaid Other

## 2019-12-31 DIAGNOSIS — N186 End stage renal disease: Secondary | ICD-10-CM | POA: Diagnosis not present

## 2019-12-31 DIAGNOSIS — E440 Moderate protein-calorie malnutrition: Secondary | ICD-10-CM | POA: Insufficient documentation

## 2019-12-31 DIAGNOSIS — E46 Unspecified protein-calorie malnutrition: Secondary | ICD-10-CM

## 2019-12-31 DIAGNOSIS — Z992 Dependence on renal dialysis: Secondary | ICD-10-CM | POA: Diagnosis not present

## 2019-12-31 LAB — TYPE AND SCREEN
ABO/RH(D): AB POS
Antibody Screen: NEGATIVE
Unit division: 0

## 2019-12-31 LAB — CBC
HCT: 24.9 % — ABNORMAL LOW (ref 36.0–46.0)
Hemoglobin: 8.5 g/dL — ABNORMAL LOW (ref 12.0–15.0)
MCH: 31.1 pg (ref 26.0–34.0)
MCHC: 34.1 g/dL (ref 30.0–36.0)
MCV: 91.2 fL (ref 80.0–100.0)
Platelets: 80 10*3/uL — ABNORMAL LOW (ref 150–400)
RBC: 2.73 MIL/uL — ABNORMAL LOW (ref 3.87–5.11)
RDW: 16 % — ABNORMAL HIGH (ref 11.5–15.5)
WBC: 5.3 10*3/uL (ref 4.0–10.5)
nRBC: 0 % (ref 0.0–0.2)

## 2019-12-31 LAB — RENAL FUNCTION PANEL
Albumin: 2.3 g/dL — ABNORMAL LOW (ref 3.5–5.0)
Anion gap: 11 (ref 5–15)
BUN: 28 mg/dL — ABNORMAL HIGH (ref 6–20)
CO2: 26 mmol/L (ref 22–32)
Calcium: 7.4 mg/dL — ABNORMAL LOW (ref 8.9–10.3)
Chloride: 96 mmol/L — ABNORMAL LOW (ref 98–111)
Creatinine, Ser: 9.75 mg/dL — ABNORMAL HIGH (ref 0.44–1.00)
GFR calc Af Amer: 6 mL/min — ABNORMAL LOW (ref >60)
GFR calc non Af Amer: 5 mL/min — ABNORMAL LOW (ref >60)
Glucose, Bld: 123 mg/dL — ABNORMAL HIGH (ref 70–99)
Phosphorus: 6.4 mg/dL — ABNORMAL HIGH (ref 2.5–4.6)
Potassium: 3.7 mmol/L (ref 3.5–5.1)
Sodium: 133 mmol/L — ABNORMAL LOW (ref 135–145)

## 2019-12-31 LAB — BPAM RBC
Blood Product Expiration Date: 202105242359
ISSUE DATE / TIME: 202104260415
Unit Type and Rh: 6200

## 2019-12-31 LAB — GLUCOSE, CAPILLARY
Glucose-Capillary: 77 mg/dL (ref 70–99)
Glucose-Capillary: 81 mg/dL (ref 70–99)
Glucose-Capillary: 98 mg/dL (ref 70–99)
Glucose-Capillary: 150 mg/dL — ABNORMAL HIGH (ref 70–99)

## 2019-12-31 LAB — HEPATITIS B SURFACE ANTIBODY, QUANTITATIVE: Hep B S AB Quant (Post): 297.9 m[IU]/mL (ref >9.9)

## 2019-12-31 MED ORDER — LIDOCAINE HCL (PF) 1 % IJ SOLN
5.0000 mL | Freq: Once | INTRAMUSCULAR | Status: DC
  Filled 2019-12-31: qty 5

## 2019-12-31 MED ORDER — LOSARTAN POTASSIUM 50 MG PO TABS
100.0000 mg | ORAL_TABLET | Freq: Every day | ORAL | Status: DC
  Administered 2019-12-31 – 2020-01-02 (×3): 100 mg via ORAL
  Filled 2019-12-31 (×3): qty 2

## 2019-12-31 NOTE — Progress Notes (Addendum)
I have personally seen and examined this patient and agree with the assessment/plan as outlined below by Marisa Sprinkles, PA student. Patient with MRSA bacteremia possibly associated with hemodialysis catheter-right IJ TDC that will be removed today for line holiday/source control as antibiotic management started with vancomycin.  Would favor placement of dialysis catheter on Thursday or Friday (if needed renal replacement therapy is acutely required, peritoneal dialysis remains an option while here in the hospital).  Othal Kubitz K.,MD 12/31/2019 10:19 AM  [facility] Progress Note   Subjective:    Patient seen at bedside. Complains of poor sleep, ongoing nausea, and dizziness. Aware that her catheter is to be pulled this morning. Denies CP/SOB.  Objective Vitals:   12/30/19 1750 12/30/19 1816 12/30/19 2129 12/31/19 0055  BP: (!) 146/81 (!) 153/93 (!) 157/101 (!) 150/115  Pulse: 96 (!) 101 (!) 108 (!) 103  Resp: 16 18 16 16   Temp: 97.8 F (36.6 C) 98.1 F (36.7 C) 97.9 F (36.6 C) 98.5 F (36.9 C)  TempSrc: Oral Oral Oral Oral  SpO2: 100% 99% 93% 96%  Weight:      Height:       Physical Exam: General: Ill-appearing young woman, scattered ecchymosis on BL breasts, NAD Heart: RRR, S1/S2 distinct. No murmur, gallop, or rub.  Lungs: CTA BL. No increased work of breathing.  Abdomen: Soft, non-tender, and non-distended.  Extremities: No LE edema. DP 2+ and equal BL.  Dialysis Access: R IJ TDC covered with bandage. Failed PD catheter in abdomen bandaged - examined 4/27 - site clean without drainage or tenderness.   Filed Weights   12/28/19 2144 12/29/19 0735 12/29/19 1057  Weight: 54.4 kg 54.4 kg 57 kg    Intake/Output Summary (Last 24 hours) at 12/31/2019 0937 Last data filed at 12/30/2019 1800 Gross per 24 hour  Intake 490.32 ml  Output -259 ml  Net 749.32 ml    Additional Objective Labs: Basic Metabolic Panel: Recent Labs  Lab 12/29/19 0116 12/29/19 0816  12/30/19 0256 12/31/19 0314  NA 134*  --  138 133*  K >7.5*  --  4.4 3.7  CL 97*  --  100 96*  CO2 <7*  --  19* 26  GLUCOSE 73  --  103* 123*  BUN 212*  --  102* 28*  CREATININE 45.04*  --  24.30* 9.75*  CALCIUM 7.7*  --  7.5* 7.4*  PHOS  --  >30.0* >30.0* 6.4*   Liver Function Tests: Recent Labs  Lab 12/29/19 0116 12/29/19 0116 12/30/19 0256 12/30/19 0855 12/31/19 0314  AST 12*  --   --  16  --   ALT 12  --   --  15  --   ALKPHOS 123  --   --  107  --   BILITOT 0.8  --   --  0.6  --   PROT 6.0*  --   --  5.4*  --   ALBUMIN 3.1*   < > 2.5* 2.5* 2.3*   < > = values in this interval not displayed.   No results for input(s): LIPASE, AMYLASE in the last 168 hours. CBC: Recent Labs  Lab 12/29/19 0239 12/29/19 0239 12/30/19 0256 12/30/19 0855 12/31/19 0314  WBC 10.1  --  5.3  --  5.3  NEUTROABS 7.9*  --   --   --   --   HGB 7.2*   < > 6.2* 7.9* 8.5*  HCT 23.3*   < > 19.2* 23.9* 24.9*  MCV 97.1  --  91.4  --  91.2  PLT 89*  --  83*  --  80*   < > = values in this interval not displayed.   Blood Culture    Component Value Date/Time   SDES BLOOD LEFT HAND 12/29/2019 1212   SPECREQUEST  12/29/2019 1212    BOTTLES DRAWN AEROBIC AND ANAEROBIC Blood Culture adequate volume   CULT GRAM POSITIVE COCCI 12/29/2019 1212   REPTSTATUS PENDING 12/29/2019 1212    Cardiac Enzymes: No results for input(s): CKTOTAL, CKMB, CKMBINDEX, TROPONINI in the last 168 hours. CBG: Recent Labs  Lab 12/30/19 0534 12/30/19 1123 12/30/19 1823 12/30/19 2200 12/31/19 0524  GLUCAP 100* 90 75 128* 77   Iron Studies:  Recent Labs    12/29/19 0816  IRON 166  TIBC 186*   Lab Results  Component Value Date   INR 1.2 12/30/2019   INR 1.2 12/29/2019   INR 1.2 10/21/2019   Studies/Results: No results found.  Medications:  . sodium chloride   Intravenous Once  . amLODipine  10 mg Oral Daily  . Chlorhexidine Gluconate Cloth  6 each Topical Q0600  . darbepoetin (ARANESP) injection -  DIALYSIS  100 mcg Intravenous Q Mon-HD  . feeding supplement (ENSURE ENLIVE)  237 mL Oral BID BM  . heparin  5,000 Units Subcutaneous Q8H  . insulin aspart  0-9 Units Subcutaneous TID WC  . lidocaine (PF)  5 mL Intradermal Once  . multivitamin  1 tablet Oral QHS  . mycophenolate  500 mg Oral BID  . ramelteon  8 mg Oral QHS  . sevelamer carbonate  1,600 mg Oral TID WC  . tacrolimus  2 mg Oral BID  . vancomycin variable dose per unstable renal function (pharmacist dosing)   Does not apply See admin instructions    Dialysis Orders: Re-establishing HD orders after "failing" PD 12/30/19: 2 K bath, 2.25 Ca, 300/600, 3.5 hours via TDC, EDW 59 kg, no UF, heparin 2000 - Aranesp given  Assessment/Plan: 1. MRSA Bacteremia: Peripheral BCx 12/29/19 positive for MRSA. Started IV vancomycin. Vascular saw patient 12/30/19. Will remove TDC this morning and give line holiday. Replace tomorrow vs. Thursday.  2. Hyperkalemia: Severe with EKG changes on admit, s/p temporizing measures in ED then urgent HD overnight. K 3.7 today.  3. ESRD: Hx failed kidney Tx, still on immunosuppressants. Failed PD recently, now changing back to HD. BUN > 200 on admit. S/p HD 4/26. HD time successfully extended and better clearance achieved. BUN 28, Cr 9.75. HD cancelled for today. Will replace catheter 4/28 vs. 4/29 with HD to follow.  4. HTN/volume: CXR clear - volume less of an issue. 5. Anemia: Hgb 6.2 4/26 - gave 1U PRBCs and Aranesp 100 mcg. Hgb up to 8.5 today. Tsat 89% (high) - avoid IV iron.  6. Secondary hyperparathyroidism: CorrCa ok, Phos down to 6.4 after HD yesterday. Continue Renvela.  7. Nutrition: Alb 2.3, Nepro supplements added.  8. 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 Consider sodium thiosulfate if this worsens. May consider future biopsy once MRSA bacteremia is cleared.   Marisa Sprinkles, PA-S [facility] [facility]

## 2019-12-31 NOTE — Progress Notes (Signed)
[facility] Catheter Removal Procedure Note  Diagnosis: ESRD with Functioning AVF/AVGG  Plan:  Remove right diatek catheter  Consent signed:  yes Time out completed:  yes Coumadin:  No. PT/INR (if applicable):   Other labs:   Procedure: 1.  Sterile prepping and draping over catheter area 2. 7 ml 2% lidocaine plain instilled at removal site. 3.  right catheter removed in its entirety with cuff in tact. 4.  Complications: none  5. Tip of catheter sent for culture:  Yes.     Patient tolerated procedure well:  yes Pressure held, no bleeding noted, dressing applied Instructions given to the pt regarding wound care and bleeding.  Other:  Roxy Horseman 12/31/2019 10:23 AM

## 2019-12-31 NOTE — Progress Notes (Addendum)
Subjective:  Tara Mills is a 24 y.o. 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 Pain improved with removal of tunneled cath. No fever or chills overnight.      Objective:  Vital signs in last 24 hours: Vitals:   12/30/19 1750 12/30/19 1816 12/30/19 2129 12/31/19 0055  BP: (!) 146/81 (!) 153/93 (!) 157/101 (!) 150/115  Pulse: 96 (!) 101 (!) 108 (!) 103  Resp: 16 18 16 16   Temp: 97.8 F (36.6 C) 98.1 F (36.7 C) 97.9 F (36.6 C) 98.5 F (36.9 C)  TempSrc: Oral Oral Oral Oral  SpO2: 100% 99% 93% 96%  Weight:      Height:        General: NAD , nl appearance Cardiovascular: Normal rate, regular rhythm.  No murmurs, rubs, or gallops.  Chest: Bilateral bruising on breast. R chest , bruising extend to cath.  Pulmonary : Effort normal, breath sounds normal. No wheezes, rales, or rhonchi Abdominal: soft, nontender,  bowel sounds present Ext: warm extremities, no lower extremity edema   CBC Latest Ref Rng & Units 12/31/2019 12/30/2019 12/30/2019  WBC 4.0 - 10.5 K/uL 5.3 - 5.3  Hemoglobin 12.0 - 15.0 g/dL 8.5(L) 7.9(L) 6.2(LL)  Hematocrit 36.0 - 46.0 % 24.9(L) 23.9(L) 19.2(L)  Platelets 150 - 400 K/uL 80(L) - 83(L)   BMP Latest Ref Rng & Units 12/31/2019 12/30/2019 12/29/2019  Glucose 70 - 99 mg/dL 123(H) 103(H) 73  BUN 6 - 20 mg/dL 28(H) 102(H) 212(H)  Creatinine 0.44 - 1.00 mg/dL 9.75(H) 24.30(H) 45.04(H)  Sodium 135 - 145 mmol/L 133(L) 138 134(L)  Potassium 3.5 - 5.1 mmol/L 3.7 4.4 >7.5(HH)  Chloride 98 - 111 mmol/L 96(L) 100 97(L)  CO2 22 - 32 mmol/L 26 19(L) <7(L)  Calcium 8.9 - 10.3 mg/dL 7.4(L) 7.5(L) 7.7(L)     Assessment/Plan:  Principal Problem:   ESRD needing dialysis ([place]) Active  Problems:   Acute on chronic anemia   (HFpEF) heart failure with preserved ejection fraction (HCC)   Hyperphosphatemia   Hyperkalemia   Malnutrition of moderate degree  Tara Mills PMH of chronic anemia requiring transfusions, HTN, HFpEF, 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 Found to have MRSA bacteremia.   ESRD on PD transitioning to HD HD on 4/25 and 4/26, BUN 28, Cr 9.75, Phos 6.4 . Tunnel cath removed today.  Patient has bilateral bruising on her breast she noticed after tunnel HD Cath placed. She noticed this right after tunnel cath placement, feel this is less likely calciphylaxis with changes on skin seen today.  Plan: - Dialysis per nephrology    Bacteremia Blood cultures 3/4 bottles positive for Staphylococcus Epidermidis, methicillin resistant . Pharmacy dosing Vancomycin, started 4/26.  Plan - Repeat blood cultures - [facility] , plan for 2 days (removed 4/27). - Vancomycin per pharmacy, first dose 4/26 after HD. Stop date 5/3  Chronic normocytic anemia Thrombocytopenia Chronic anemia and thrombocytopenia.  Patient follows with outpatient hematologist.  Likely secondary to end-stage renal disease, but undergoing further work-up with hematology. Transfused for Hgb 6.2 on 4/26 and received Aranesp with HD. Hgb today 8.5. Plan: - Trend CBC - Diffuse for  hemoglobin less than 7  HTN BP 150/115.   Plan: - Continue amlodipine - Restart home losartan 100 mg  DVT prophx: Heparin Diet: Diet with 1200 mL fluid restriction Bowel: N/A Code: Full  Dispo: Anticipated discharge in approximately 1-2 day(s).   Tara Snider, MD PGY1

## 2019-12-31 NOTE — Procedures (Signed)
Echo attempted. Patient and nurse stated patient needed to remain upright until after 2 pm after procedure this morning at 10 am.

## 2020-01-01 LAB — CULTURE, BLOOD (ROUTINE X 2)
Special Requests: ADEQUATE
Special Requests: ADEQUATE

## 2020-01-01 LAB — GLUCOSE, CAPILLARY
Glucose-Capillary: 93 mg/dL (ref 70–99)
Glucose-Capillary: 86 mg/dL (ref 70–99)
Glucose-Capillary: 77 mg/dL (ref 70–99)
Glucose-Capillary: 112 mg/dL — ABNORMAL HIGH (ref 70–99)

## 2020-01-01 MED ORDER — CARVEDILOL 6.25 MG PO TABS
6.2500 mg | ORAL_TABLET | Freq: Two times a day (BID) | ORAL | Status: DC
  Administered 2020-01-01 – 2020-01-03 (×5): 6.25 mg via ORAL
  Filled 2020-01-01 (×5): qty 1

## 2020-01-01 MED ORDER — LORATADINE 10 MG PO TABS
10.0000 mg | ORAL_TABLET | Freq: Once | ORAL | Status: AC
  Administered 2020-01-01: 10 mg via ORAL
  Filled 2020-01-01: qty 1

## 2020-01-01 NOTE — Progress Notes (Addendum)
  Progress Note    01/01/2020 7:54 AM * No surgery found *  Subjective:  States she just feels tired.Mild right neck soreness but better since catheter removed   Vitals:   12/31/19 2317 01/01/20 0524  BP: (!) 161/115 (!) 160/110  Pulse: (!) 104 87  Resp: 17 16  Temp: 98.3 F (36.8 C) 97.7 F (36.5 C)  SpO2: 96% 99%   Physical Exam: General: well nourished, not in distress, appears fatigued Cardiac: regular. Right IJ TDC site dressings clean dry and intact. Mild right chest wall ecchymosis. Mildly tender. No swelling Lungs:  Non labored Extremities:  Well perfused Neurologic: alert and oriented  CBC    Component Value Date/Time   WBC 5.3 12/31/2019 0314   RBC 2.73 (L) 12/31/2019 0314   HGB 8.5 (L) 12/31/2019 0314   HCT 24.9 (L) 12/31/2019 0314   PLT 80 (L) 12/31/2019 0314   MCV 91.2 12/31/2019 0314   MCH 31.1 12/31/2019 0314   MCHC 34.1 12/31/2019 0314   RDW 16.0 (H) 12/31/2019 0314   LYMPHSABS 1.0 12/29/2019 0239   MONOABS 0.8 12/29/2019 0239   EOSABS 0.1 12/29/2019 0239   BASOSABS 0.1 12/29/2019 0239    BMET    Component Value Date/Time   NA 133 (L) 12/31/2019 0314   K 3.7 12/31/2019 0314   CL 96 (L) 12/31/2019 0314   CO2 26 12/31/2019 0314   GLUCOSE 123 (H) 12/31/2019 0314   BUN 28 (H) 12/31/2019 0314   CREATININE 9.75 (H) 12/31/2019 0314   CALCIUM 7.4 (L) 12/31/2019 0314   GFRNONAA 5 (L) 12/31/2019 0314   GFRAA 6 (L) 12/31/2019 0314    INR    Component Value Date/Time   INR 1.2 12/30/2019 0855     Intake/Output Summary (Last 24 hours) at 01/01/2020 0754 Last data filed at 01/01/2020 0700 Gross per 24 hour  Intake 340 ml  Output --  Net 340 ml     Assessment/Plan:  24 y.o. female is s/p removal of right IJ TDC. She has been on line holiday. Plan for [facility] placement tomorrow. Will keep NPO after midnight  Karoline Caldwell, [place] [facility] [phone] 01/01/2020 7:54 AM   I have seen and evaluated the patient. I agree  with the PA note as documented above.  Patient continues line holiday after catheter removal yesterday morning for bacteremia.  I discussed with her plans for placement of [facility] catheter tomorrow in the OR with Dr. Donnetta Hutching.  Please keep n.p.o. after midnight.  Marty Heck, MD [facility] [place] Office: [phone]

## 2020-01-01 NOTE — H&P (View-Only) (Signed)
  Progress Note    01/01/2020 7:54 AM * No surgery found *  Subjective:  States she just feels tired.Mild right neck soreness but better since catheter removed   Vitals:   12/31/19 2317 01/01/20 0524  BP: (!) 161/115 (!) 160/110  Pulse: (!) 104 87  Resp: 17 16  Temp: 98.3 F (36.8 C) 97.7 F (36.5 C)  SpO2: 96% 99%   Physical Exam: General: well nourished, not in distress, appears fatigued Cardiac: regular. Right IJ TDC site dressings clean dry and intact. Mild right chest wall ecchymosis. Mildly tender. No swelling Lungs:  Non labored Extremities:  Well perfused Neurologic: alert and oriented  CBC    Component Value Date/Time   WBC 5.3 12/31/2019 0314   RBC 2.73 (L) 12/31/2019 0314   HGB 8.5 (L) 12/31/2019 0314   HCT 24.9 (L) 12/31/2019 0314   PLT 80 (L) 12/31/2019 0314   MCV 91.2 12/31/2019 0314   MCH 31.1 12/31/2019 0314   MCHC 34.1 12/31/2019 0314   RDW 16.0 (H) 12/31/2019 0314   LYMPHSABS 1.0 12/29/2019 0239   MONOABS 0.8 12/29/2019 0239   EOSABS 0.1 12/29/2019 0239   BASOSABS 0.1 12/29/2019 0239    BMET    Component Value Date/Time   NA 133 (L) 12/31/2019 0314   K 3.7 12/31/2019 0314   CL 96 (L) 12/31/2019 0314   CO2 26 12/31/2019 0314   GLUCOSE 123 (H) 12/31/2019 0314   BUN 28 (H) 12/31/2019 0314   CREATININE 9.75 (H) 12/31/2019 0314   CALCIUM 7.4 (L) 12/31/2019 0314   GFRNONAA 5 (L) 12/31/2019 0314   GFRAA 6 (L) 12/31/2019 0314    INR    Component Value Date/Time   INR 1.2 12/30/2019 0855     Intake/Output Summary (Last 24 hours) at 01/01/2020 0754 Last data filed at 01/01/2020 0700 Gross per 24 hour  Intake 340 ml  Output --  Net 340 ml     Assessment/Plan:  24 y.o. female is s/p removal of right IJ TDC. She has been on line holiday. Plan for [facility] placement tomorrow. Will keep NPO after midnight  Karoline Caldwell, [place] [facility] [phone] 01/01/2020 7:54 AM   I have seen and evaluated the patient. I agree  with the PA note as documented above.  Patient continues line holiday after catheter removal yesterday morning for bacteremia.  I discussed with her plans for placement of [facility] catheter tomorrow in the OR with Dr. Donnetta Hutching.  Please keep n.p.o. after midnight.  Marty Heck, MD [facility] [place] Office: [phone]

## 2020-01-01 NOTE — Progress Notes (Addendum)
Subjective:  Tara Mills is a 24 y.o. 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 Discussed plan to continue IV vanc with her dialysis and plan for discharge after catheter placement. [name] expressed understanding.  Objective:  Vital signs in last 24 hours: Vitals:   12/31/19 1756 12/31/19 2317 01/01/20 0524 01/01/20 0600  BP: (!) 170/127 (!) 161/115 (!) 160/110   Pulse: (!) 101 (!) 104 87   Resp: 16 17 16    Temp: 98.6 F (37 C) 98.3 F (36.8 C) 97.7 F (36.5 C)   TempSrc: Oral Oral Oral   SpO2: 97% 96% 99%   Weight:    57 kg  Height:        General: NAD , nl appearance Cardiovascular: Normal rate, regular rhythm.  No murmurs, rubs, or gallops.  Chest: Bilateral bruising on breast improving  Pulmonary : Effort normal, breath sounds normal. No wheezes, rales, or rhonchi Abdominal: soft, nontender,  bowel sounds present Ext: warm extremities, no lower extremity edema   CBC Latest Ref Rng & Units 12/31/2019 12/30/2019 12/30/2019  WBC 4.0 - 10.5 K/uL 5.3 - 5.3  Hemoglobin 12.0 - 15.0 g/dL 8.5(L) 7.9(L) 6.2(LL)  Hematocrit 36.0 - 46.0 % 24.9(L) 23.9(L) 19.2(L)  Platelets 150 - 400 K/uL 80(L) - 83(L)   BMP Latest Ref Rng & Units 12/31/2019 12/30/2019 12/29/2019  Glucose 70 - 99 mg/dL 123(H) 103(H) 73  BUN 6 - 20 mg/dL 28(H) 102(H) 212(H)  Creatinine 0.44 - 1.00 mg/dL 9.75(H) 24.30(H) 45.04(H)  Sodium 135 - 145 mmol/L 133(L) 138 134(L)  Potassium 3.5 - 5.1 mmol/L 3.7 4.4 >7.5(HH)  Chloride 98 - 111 mmol/L 96(L) 100 97(L)  CO2 22 - 32 mmol/L 26 19(L) <7(L)  Calcium 8.9 - 10.3 mg/dL 7.4(L) 7.5(L) 7.7(L)     Assessment/Plan:  Principal Problem:   ESRD needing  dialysis ([place]) Active Problems:   Acute on chronic anemia   (HFpEF) heart failure with preserved ejection fraction (HCC)   Hyperphosphatemia   Hyperkalemia   Malnutrition of moderate degree  Tara Mills PMH of chronic anemia requiring transfusions, HTN, HFpEF, 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 Found to have MRSA bacteremia.   ESRD on PD transitioning to HD HD on 4/25 and 4/26, BUN 28, Cr 9.75, Phos 6.4 on 4/27 . Tunnel cath removed 4/27. Plan for new HD cath placement 4/29. Plan: - Dialysis per nephrology    Methicillin resistant staph epidermis bacteremia Blood cultures 3/4 bottles positive for Staphylococcus Epidermidis, methicillin resistant . Pharmacy dosing Vancomycin, started 4/26. Repeat blood cultures ngtd  Plan - Follow repeat blood cultures - [facility] , plan for 2 days (removed 4/27). - Vancomycin per pharmacy, first dose 4/26 after HD. Stop date 5/3  Chronic normocytic anemia Thrombocytopenia Chronic anemia and thrombocytopenia.  Patient follows with outpatient hematologist.  Likely secondary to end-stage renal disease, but undergoing further work-up with hematology. Transfused for Hgb 6.2 on 4/26 and received Aranesp with HD. Hgb 4/27, 8.5. Plan: - Trend CBC - Diffuse for hemoglobin less than 7  HTN BP continues to be elevated , started on Coreg today.  Plan: - Continue amlodipine 10 - Continue losartan 100 mg - Started on Coreg 6.25 BID  DVT prophx: Heparin Diet: Diet with 1200 mL fluid restriction Bowel: N/A Code: Full  Dispo: Anticipated discharge in approximately 1-2 day(s).   [name], MD PGY1

## 2020-01-01 NOTE — Progress Notes (Signed)
Renal Navigator confirmed that patient has a seat schedule at a new OP HD clinic, [facility], and has been accepted there by the [facility] team, Dr. Smith Mince. Her seat time is 12:35pm TTS. She needs to arrive to her appointments at 12:15pm. At this point, her start date is Tuesday, May 4th. On her first day at the clinic, she needs to arrive at 11:45am. Nephrologist updated. Navigator will meet with patient prior to discharge to ensure that she is understanding of plan and to review the importance of OP HD compliance.  Alphonzo Cruise, [place] [facility]al Navigator  [phone]

## 2020-01-01 NOTE — Progress Notes (Addendum)
I have personally seen and examined this patient and agree with the assessment/plan as outlined below by Marisa Sprinkles, PA student. Ms. Brailsford reports some right arm pain/right upper chest pain at the site of [facility] removal but is otherwise doing fairly.  She denies any chest pain or shortness of breath and does not have any labs from this morning.  She will have a new dialysis catheter placed tomorrow after 48-hour line holiday starting yesterday.  Her blood pressures remain persistently elevated on current therapy with amlodipine 10 mg daily and losartan 100 mg daily.  I will start her on carvedilol 6.25 mg twice daily.  Jamye Balicki K.,MD 01/01/2020 11:13 AM   [facility] Progress Note   Subjective:   Patient had catheter removed yesterday. Notes some soreness with movement of her right arm, but no other complaints. She slept poorly again yesterday despite use of Ramelteon. Chronic issue for her. Patient endorses decent appetite, some persistent nausea but no vomiting. Denies cough, CP, or SOB.  She is aware that her catheter will be replaced tomorrow to resume HD given negative blood cultures.   Objective: Vitals:   12/31/19 1756 12/31/19 2317 01/01/20 0524 01/01/20 0600  BP: (!) 170/127 (!) 161/115 (!) 160/110   Pulse: (!) 101 (!) 104 87   Resp: 16 17 16    Temp: 98.6 F (37 C) 98.3 F (36.8 C) 97.7 F (36.5 C)   TempSrc: Oral Oral Oral   SpO2: 97% 96% 99%   Weight:    57 kg  Height:       Physical Exam: General:Pleasant, soft-spoken female sitting up in hospital bed, dependant ecchymosis on BL breasts s/p TDC placement, NAD. Heart:RRR, S1/S2 distinct. No murmur, gallop, or rub.  Lungs:CTA BL. No increased work of breathing.  Abdomen:Soft, non-tender, and non-distended.  Extremities:No LE edema. DP 2+ and equal BL.  Dialysis Access:Site of R IJ TDC removal covered with bandage. Tender without surrounding visible erythema. PD catheter in abdomen bandaged,  nontender.   Filed Weights   12/29/19 0735 12/29/19 1057 01/01/20 0600  Weight: 54.4 kg 57 kg 57 kg    Intake/Output Summary (Last 24 hours) at 01/01/2020 1016 Last data filed at 01/01/2020 0700 Gross per 24 hour  Intake 340 ml  Output -  Net 340 ml    Additional Objective Labs: Basic Metabolic Panel: Recent Labs  Lab 12/29/19 0116 12/29/19 0816 12/30/19 0256 12/31/19 0314  NA 134*  --  138 133*  K >7.5*  --  4.4 3.7  CL 97*  --  100 96*  CO2 <7*  --  19* 26  GLUCOSE 73  --  103* 123*  BUN 212*  --  102* 28*  CREATININE 45.04*  --  24.30* 9.75*  CALCIUM 7.7*  --  7.5* 7.4*  PHOS  --  >30.0* >30.0* 6.4*   Liver Function Tests: Recent Labs  Lab 12/29/19 0116 12/29/19 0116 12/30/19 0256 12/30/19 0855 12/31/19 0314  AST 12*  --   --  16  --   ALT 12  --   --  15  --   ALKPHOS 123  --   --  107  --   BILITOT 0.8  --   --  0.6  --   PROT 6.0*  --   --  5.4*  --   ALBUMIN 3.1*   < > 2.5* 2.5* 2.3*   < > = values in this interval not displayed.   No results for input(s): LIPASE, AMYLASE in  the last 168 hours. CBC: Recent Labs  Lab 12/29/19 0239 12/29/19 0239 12/30/19 0256 12/30/19 0855 12/31/19 0314  WBC 10.1  --  5.3  --  5.3  NEUTROABS 7.9*  --   --   --   --   HGB 7.2*   < > 6.2* 7.9* 8.5*  HCT 23.3*   < > 19.2* 23.9* 24.9*  MCV 97.1  --  91.4  --  91.2  PLT 89*  --  83*  --  80*   < > = values in this interval not displayed.   Blood Culture    Component Value Date/Time   SDES BLOOD RIGHT ARM 12/31/2019 1158   SPECREQUEST  12/31/2019 1158    BOTTLES DRAWN AEROBIC AND ANAEROBIC Blood Culture results may not be optimal due to an inadequate volume of blood received in culture bottles   CULT  12/31/2019 1158    NO GROWTH < 24 HOURS Performed at [facility], [place] [address] [place],  [address]    REPTSTATUS PENDING 12/31/2019 1158    Cardiac Enzymes: No results for input(s): CKTOTAL, CKMB, CKMBINDEX, TROPONINI in the last 168  hours. CBG: Recent Labs  Lab 12/31/19 0524 12/31/19 1055 12/31/19 1622 12/31/19 2124 01/01/20 0620  GLUCAP 77 81 98 150* 93   Iron Studies: No results for input(s): IRON, TIBC, TRANSFERRIN, FERRITIN in the last 72 hours. Lab Results  Component Value Date   INR 1.2 12/30/2019   INR 1.2 12/29/2019   INR 1.2 10/21/2019   Studies/Results: No results found.  Medications:  . sodium chloride   Intravenous Once  . amLODipine  10 mg Oral Daily  . Chlorhexidine Gluconate Cloth  6 each Topical Q0600  . darbepoetin (ARANESP) injection - DIALYSIS  100 mcg Intravenous Q Mon-HD  . feeding supplement (ENSURE ENLIVE)  237 mL Oral BID BM  . heparin  5,000 Units Subcutaneous Q8H  . insulin aspart  0-9 Units Subcutaneous TID WC  . lidocaine (PF)  5 mL Intradermal Once  . losartan  100 mg Oral QHS  . multivitamin  1 tablet Oral QHS  . mycophenolate  500 mg Oral BID  . ramelteon  8 mg Oral QHS  . sevelamer carbonate  1,600 mg Oral TID WC  . tacrolimus  2 mg Oral BID  . vancomycin variable dose per unstable renal function (pharmacist dosing)   Does not apply See admin instructions    Dialysis Orders 12/30/19: Re-establishing HD orders after poor adherence to home PD 12/30/19: 2 K bath, 2.25 Ca, 300/600, 3.5 hours via TDC, EDW 59 kg, no UF, heparin 2000 - Aranesp given 12/30/19. Due again 01/06/20.    Assessment/Plan: 1. MRSA Bacteremia: Peripheral BCx 12/29/19 positive for MRSA. Possibly associated with R IJ TDC. Started IV vancomycin. Vascular removed catheter yesterday, 12/31/19. Allowing 48-hour line holiday. Plan to replace tomorrow if repeat BCx are clear.  - Can likely hold out for HD until tomorrow vs. Friday. If RRT acutely indicated, PD can be considered an option while in the hospital.  - Plan for follow up for PD removal upon discharge.   2.Hyperkalemia: Severe with EKG changes on admit, s/p temporizing measures in ED then urgent HD overnight. K 3.7 today.   3. ESRD:Hx failed  kidney Tx, still on immunosuppressants. Difficulty adhering to home PD, now transitioning to HD. BUN > 200 on admit. S/p HD 4/26. HD time successfully extended and better clearance achieved in preparation for 48-hour line holiday. No labs today. Planning  for catheter replacement in the morning, 4/29, with HD to follow vs. Friday.   4.HTN/volume:CXR 4/24 clear. Lungs clear on exam, no extremity edema. Hypertensive to 160/110 this morning. Patient on amlodipine 10 mg and losartan 100 mg. Will monitor this in determining need for dialysis sooner than planned.   5. Anemia of CKD:Hgb 6.2 4/26 - gave 1U PRBCs and Aranesp 100 mcg. Hgb 8.5 yesterday, 4/27. Tsat 89% (high) - avoid IV iron. Due for Aranesp again 5/3.   6. Secondary hyperparathyroidism:CorrCa ok, Phos down to 6.4 after HD Monday, 4/26. Continue Renvela.   7. Nutrition:Alb 2.3, Nepro supplements given.   8. Dependant ecchymoses of breast: Upon further evaluation, concerning areas are resolving and appear to be dependant bruising from [facility] placement rather than calciphylaxis.  - Reassured patient that the areas along her breasts are most consistent with bruising. However, education was provided that with her critical lab values, calciphylaxis, a rare but serious condition, can occur.  - Will continue to avoid calcium-containing binders.     Marisa Sprinkles, PA-S [facility]

## 2020-01-02 ENCOUNTER — Inpatient Hospital Stay (HOSPITAL_COMMUNITY): Payer: Medicaid Other | Admitting: Anesthesiology

## 2020-01-02 ENCOUNTER — Inpatient Hospital Stay (HOSPITAL_COMMUNITY): Payer: Medicaid Other

## 2020-01-02 ENCOUNTER — Encounter (HOSPITAL_COMMUNITY)
Admission: EM | Disposition: A | Discharge status: Home / Self Care | Payer: Self-pay | Source: Home / Self Care | Attending: Internal Medicine

## 2020-01-02 ENCOUNTER — Encounter (HOSPITAL_COMMUNITY): Payer: Self-pay | Admitting: Internal Medicine

## 2020-01-02 ENCOUNTER — Other Ambulatory Visit: Payer: Self-pay

## 2020-01-02 DIAGNOSIS — Z992 Dependence on renal dialysis: Secondary | ICD-10-CM | POA: Diagnosis not present

## 2020-01-02 DIAGNOSIS — N186 End stage renal disease: Secondary | ICD-10-CM | POA: Diagnosis not present

## 2020-01-02 HISTORY — PX: INSERTION OF DIALYSIS CATHETER: SHX1324

## 2020-01-02 LAB — RENAL FUNCTION PANEL
Albumin: 2.4 g/dL — ABNORMAL LOW (ref 3.5–5.0)
Anion gap: 15 (ref 5–15)
BUN: 56 mg/dL — ABNORMAL HIGH (ref 6–20)
CO2: 22 mmol/L (ref 22–32)
Calcium: 8.5 mg/dL — ABNORMAL LOW (ref 8.9–10.3)
Chloride: 102 mmol/L (ref 98–111)
Creatinine, Ser: 13.89 mg/dL — ABNORMAL HIGH (ref 0.44–1.00)
GFR calc Af Amer: 4 mL/min — ABNORMAL LOW (ref >60)
GFR calc non Af Amer: 3 mL/min — ABNORMAL LOW (ref >60)
Glucose, Bld: 100 mg/dL — ABNORMAL HIGH (ref 70–99)
Phosphorus: 9.6 mg/dL — ABNORMAL HIGH (ref 2.5–4.6)
Potassium: 4.1 mmol/L (ref 3.5–5.1)
Sodium: 139 mmol/L (ref 135–145)

## 2020-01-02 LAB — CBC
HCT: 23 % — ABNORMAL LOW (ref 36.0–46.0)
Hemoglobin: 7.5 g/dL — ABNORMAL LOW (ref 12.0–15.0)
MCH: 29.9 pg (ref 26.0–34.0)
MCHC: 32.6 g/dL (ref 30.0–36.0)
MCV: 91.6 fL (ref 80.0–100.0)
Platelets: 132 10*3/uL — ABNORMAL LOW (ref 150–400)
RBC: 2.51 MIL/uL — ABNORMAL LOW (ref 3.87–5.11)
RDW: 15.4 % (ref 11.5–15.5)
WBC: 6.4 10*3/uL (ref 4.0–10.5)
nRBC: 0 % (ref 0.0–0.2)

## 2020-01-02 LAB — GLUCOSE, CAPILLARY
Glucose-Capillary: 71 mg/dL (ref 70–99)
Glucose-Capillary: 75 mg/dL (ref 70–99)
Glucose-Capillary: 131 mg/dL — ABNORMAL HIGH (ref 70–99)

## 2020-01-02 LAB — HCG, QUANTITATIVE, PREGNANCY: hCG, Beta Chain, Quant, S: 2 m[IU]/mL (ref <5)

## 2020-01-02 LAB — CATH TIP CULTURE: Culture: >=100000 — AB

## 2020-01-02 SURGERY — INSERTION OF DIALYSIS CATHETER
Anesthesia: Monitor Anesthesia Care | Site: Neck | Laterality: Left

## 2020-01-02 MED ORDER — EPHEDRINE 5 MG/ML INJ
INTRAVENOUS | Status: AC
  Filled 2020-01-02: qty 10

## 2020-01-02 MED ORDER — SODIUM CHLORIDE 0.9 % IV SOLN
INTRAVENOUS | Status: AC
  Filled 2020-01-02: qty 1.2

## 2020-01-02 MED ORDER — 0.9 % SODIUM CHLORIDE (POUR BTL) OPTIME
TOPICAL | Status: DC | PRN
  Administered 2020-01-02: 1000 mL

## 2020-01-02 MED ORDER — HEPARIN SODIUM (PORCINE) 1000 UNIT/ML IJ SOLN
INTRAMUSCULAR | Status: DC | PRN
  Administered 2020-01-02: 3800 [IU] via INTRAVENOUS

## 2020-01-02 MED ORDER — LIDOCAINE-EPINEPHRINE 0.5 %-1:200000 IJ SOLN
INTRAMUSCULAR | Status: DC | PRN
  Administered 2020-01-02: 30 mL via INTRADERMAL

## 2020-01-02 MED ORDER — FENTANYL CITRATE (PF) 100 MCG/2ML IJ SOLN
INTRAMUSCULAR | Status: DC | PRN
  Administered 2020-01-02: 100 ug via INTRAVENOUS
  Administered 2020-01-02: 25 ug via INTRAVENOUS

## 2020-01-02 MED ORDER — MIDAZOLAM HCL 5 MG/5ML IJ SOLN
INTRAMUSCULAR | Status: DC | PRN
  Administered 2020-01-02: 2 mg via INTRAVENOUS

## 2020-01-02 MED ORDER — VANCOMYCIN HCL IN DEXTROSE 500-5 MG/100ML-% IV SOLN
500.0000 mg | Freq: Once | INTRAVENOUS | Status: DC
  Filled 2020-01-02: qty 100

## 2020-01-02 MED ORDER — LIDOCAINE-EPINEPHRINE 0.5 %-1:200000 IJ SOLN
INTRAMUSCULAR | Status: AC
  Filled 2020-01-02: qty 1

## 2020-01-02 MED ORDER — MIDAZOLAM HCL 2 MG/2ML IJ SOLN
INTRAMUSCULAR | Status: AC
  Filled 2020-01-02: qty 2

## 2020-01-02 MED ORDER — LIDOCAINE 2% (20 MG/ML) 5 ML SYRINGE
INTRAMUSCULAR | Status: AC
  Filled 2020-01-02: qty 10

## 2020-01-02 MED ORDER — FENTANYL CITRATE (PF) 250 MCG/5ML IJ SOLN
INTRAMUSCULAR | Status: AC
  Filled 2020-01-02: qty 5

## 2020-01-02 MED ORDER — SODIUM CHLORIDE 0.9 % IV SOLN
INTRAVENOUS | Status: DC | PRN
  Administered 2020-01-02: 500 mL

## 2020-01-02 MED ORDER — HEPARIN SODIUM (PORCINE) 1000 UNIT/ML IJ SOLN
INTRAMUSCULAR | Status: AC
  Filled 2020-01-02: qty 1

## 2020-01-02 MED ORDER — PROPOFOL 500 MG/50ML IV EMUL
INTRAVENOUS | Status: DC | PRN
  Administered 2020-01-02: 75 ug/kg/min via INTRAVENOUS

## 2020-01-02 MED ORDER — FENTANYL CITRATE (PF) 100 MCG/2ML IJ SOLN
25.0000 ug | INTRAMUSCULAR | Status: DC | PRN
  Administered 2020-01-02: 14:00:00 50 ug via INTRAVENOUS

## 2020-01-02 MED ORDER — SODIUM CHLORIDE 0.9 % IV SOLN: INTRAVENOUS | Status: DC | PRN

## 2020-01-02 MED ORDER — HYDROMORPHONE HCL 1 MG/ML PO LIQD: 0.5000 mg | Freq: Once | ORAL | Status: DC

## 2020-01-02 MED ORDER — HYDROMORPHONE HCL 2 MG PO TABS: 1.0000 mg | ORAL_TABLET | Freq: Once | ORAL | Status: DC

## 2020-01-02 MED ORDER — FENTANYL CITRATE (PF) 100 MCG/2ML IJ SOLN
INTRAMUSCULAR | Status: AC
  Filled 2020-01-02: qty 2

## 2020-01-02 MED ORDER — CEFAZOLIN SODIUM-DEXTROSE 2-3 GM-%(50ML) IV SOLR
INTRAVENOUS | Status: DC | PRN
Start: 2020-01-02 — End: 2020-01-02
  Administered 2020-01-02: 2 g via INTRAVENOUS

## 2020-01-02 MED ORDER — ONDANSETRON HCL 4 MG/2ML IJ SOLN
INTRAMUSCULAR | Status: AC
  Filled 2020-01-02: qty 6

## 2020-01-02 MED ORDER — LIDOCAINE HCL (CARDIAC) PF 100 MG/5ML IV SOSY
PREFILLED_SYRINGE | INTRAVENOUS | Status: DC | PRN
Start: 2020-01-02 — End: 2020-01-02
  Administered 2020-01-02: 20 mg via INTRATRACHEAL
  Administered 2020-01-02: 40 mg via INTRATRACHEAL

## 2020-01-02 MED ORDER — ACETAMINOPHEN 325 MG PO TABS
650.0000 mg | ORAL_TABLET | Freq: Four times a day (QID) | ORAL | Status: DC | PRN
  Administered 2020-01-02 – 2020-01-03 (×3): 650 mg via ORAL
  Filled 2020-01-02 (×2): qty 2

## 2020-01-02 MED ORDER — PROPOFOL 10 MG/ML IV BOLUS
INTRAVENOUS | Status: DC | PRN
  Administered 2020-01-02: 20 mg via INTRAVENOUS
  Administered 2020-01-02: 100 mg via INTRAVENOUS
  Administered 2020-01-02: 20 mg via INTRAVENOUS

## 2020-01-02 MED ORDER — HYDROMORPHONE HCL 1 MG/ML IJ SOLN
INTRAMUSCULAR | Status: AC
  Administered 2020-01-02: 17:00:00 0.5 mg
  Filled 2020-01-02: qty 1

## 2020-01-02 MED ORDER — SUCCINYLCHOLINE CHLORIDE 200 MG/10ML IV SOSY
PREFILLED_SYRINGE | INTRAVENOUS | Status: AC
  Filled 2020-01-02: qty 10

## 2020-01-02 MED ORDER — PROPOFOL 10 MG/ML IV BOLUS
INTRAVENOUS | Status: AC
  Filled 2020-01-02: qty 20

## 2020-01-02 MED ORDER — PHENYLEPHRINE 40 MCG/ML (10ML) SYRINGE FOR IV PUSH (FOR BLOOD PRESSURE SUPPORT)
PREFILLED_SYRINGE | INTRAVENOUS | Status: AC
  Filled 2020-01-02: qty 10

## 2020-01-02 MED ORDER — DEXAMETHASONE SODIUM PHOSPHATE 10 MG/ML IJ SOLN
INTRAMUSCULAR | Status: AC
  Filled 2020-01-02: qty 3

## 2020-01-02 SURGICAL SUPPLY — 41 items
BAG DECANTER FOR FLEXI CONT (MISCELLANEOUS) ×2 IMPLANT
BIOPATCH RED 1 DISK 7.0 (GAUZE/BANDAGES/DRESSINGS) ×2 IMPLANT
CATH PALINDROME RT-P 15FX19CM (CATHETERS) IMPLANT
CATH PALINDROME RT-P 15FX23CM (CATHETERS) IMPLANT
CATH PALINDROME RT-P 15FX28CM (CATHETERS) IMPLANT
CATH PALINDROME RT-P 15FX55CM (CATHETERS) IMPLANT
CATH PALINDROME-P 23CM W/VT (CATHETERS) ×1 IMPLANT
COVER PROBE W GEL 5X96 (DRAPES) ×2 IMPLANT
COVER SURGICAL LIGHT HANDLE (MISCELLANEOUS) ×2 IMPLANT
COVER WAND RF STERILE (DRAPES) ×1 IMPLANT
DECANTER SPIKE VIAL GLASS SM (MISCELLANEOUS) ×2 IMPLANT
DERMABOND ADVANCED (GAUZE/BANDAGES/DRESSINGS) ×1
DERMABOND ADVANCED .7 DNX12 (GAUZE/BANDAGES/DRESSINGS) ×1 IMPLANT
DRAPE C-ARM 42X72 X-RAY (DRAPES) ×2 IMPLANT
DRAPE CHEST BREAST 15X10 FENES (DRAPES) ×2 IMPLANT
GAUZE 4X4 16PLY RFD (DISPOSABLE) ×2 IMPLANT
GLOVE SS BIOGEL STRL SZ 7.5 (GLOVE) ×1 IMPLANT
GLOVE SUPERSENSE BIOGEL SZ 7.5 (GLOVE) ×1
GOWN STRL REUS W/ TWL LRG LVL3 (GOWN DISPOSABLE) ×2 IMPLANT
GOWN STRL REUS W/TWL LRG LVL3 (GOWN DISPOSABLE) ×2
KIT BASIN OR (CUSTOM PROCEDURE TRAY) ×2 IMPLANT
KIT TURNOVER KIT B (KITS) ×2 IMPLANT
NDL 18GX1X1/2 (RX/OR ONLY) (NEEDLE) ×1 IMPLANT
NDL HYPO 25GX1X1/2 BEV (NEEDLE) ×1 IMPLANT
NEEDLE 18GX1X1/2 (RX/OR ONLY) (NEEDLE) ×2 IMPLANT
NEEDLE 22X1 1/2 (OR ONLY) (NEEDLE) IMPLANT
NEEDLE HYPO 25GX1X1/2 BEV (NEEDLE) ×2 IMPLANT
NS IRRIG 1000ML POUR BTL (IV SOLUTION) ×2 IMPLANT
PACK SURGICAL SETUP 50X90 (CUSTOM PROCEDURE TRAY) ×2 IMPLANT
PAD ARMBOARD 7.5X6 YLW CONV (MISCELLANEOUS) ×4 IMPLANT
SOAP 2 % CHG 4 OZ (WOUND CARE) ×2 IMPLANT
SPONGE LAP 18X18 RF (DISPOSABLE) ×1 IMPLANT
SUT ETHILON 3 0 PS 1 (SUTURE) ×2 IMPLANT
SUT VICRYL 4-0 PS2 18IN ABS (SUTURE) ×2 IMPLANT
SYR 10ML LL (SYRINGE) ×2 IMPLANT
SYR 20ML LL LF (SYRINGE) ×2 IMPLANT
SYR 5ML LL (SYRINGE) ×4 IMPLANT
SYR CONTROL 10ML LL (SYRINGE) ×2 IMPLANT
TOWEL GREEN STERILE (TOWEL DISPOSABLE) ×4 IMPLANT
TOWEL GREEN STERILE FF (TOWEL DISPOSABLE) ×2 IMPLANT
WATER STERILE IRR 1000ML POUR (IV SOLUTION) ×2 IMPLANT

## 2020-01-02 NOTE — Anesthesia Postprocedure Evaluation (Signed)
Anesthesia Post Note  Patient: Armed forces training and education officer  Procedure(s) Performed: INSERTION OF DIALYSIS CATHETER, left internal jugular (Left Neck)     Patient location during evaluation: PACU Anesthesia Type: MAC Level of consciousness: awake and alert Pain management: pain level controlled Vital Signs Assessment: post-procedure vital signs reviewed and stable Respiratory status: spontaneous breathing, nonlabored ventilation and respiratory function stable Cardiovascular status: stable and blood pressure returned to baseline Postop Assessment: no apparent nausea or vomiting Anesthetic complications: no    Last Vitals:  Vitals:   01/02/20 1419 01/02/20 1434  BP: (!) 143/107 (!) 147/113  Pulse: 84 83  Resp: 17 15  Temp:  36.6 C  SpO2: 92% 93%    Last Pain:  Vitals:   01/02/20 1434  TempSrc:   PainSc: Asleep                 Shalicia Craghead,W. EDMOND

## 2020-01-02 NOTE — Interval H&P Note (Signed)
History and Physical Interval Note:  01/02/2020 12:02 PM  Tara Mills  has presented today for surgery, with the diagnosis of renal failure.  The various methods of treatment have been discussed with the patient and family. After consideration of risks, benefits and other options for treatment, the patient has consented to  Procedure(s): INSERTION OF DIALYSIS CATHETER (N/A) as a surgical intervention.  The patient's history has been reviewed, patient examined, no change in status, stable for surgery.  I have reviewed the patient's chart and labs.  Questions were answered to the patient's satisfaction.     Curt Jews

## 2020-01-02 NOTE — Progress Notes (Addendum)
I have personally seen and examined this patient and agree with the assessment/plan as outlined below by Marisa Sprinkles, PA student. Patient to undergo placement of new tunneled hemodialysis catheter today after 48-hour line holiday and clearance of bacteremia repeat cultures.  Hemodialysis thereafter and may be stable enough to discharge later today or tomorrow with outpatient antibiotics at the dialysis center.  Fonda Rochon K.,MD 01/02/2020 12:12 PM  [facility] Progress Note   Subjective:    Patient examined prior to procedure. Will have new dialysis catheter placed given persistently negative blood cultures and 48-hour line holiday. Right arm/upper chest pain improved today. She denies shortness of breath. Nausea improved, appetite stable - NPO since midnight. Doing well with new BP medication. No concerns at this time.   Objective: Vitals:   01/01/20 1202 01/01/20 1820 01/02/20 0033 01/02/20 0549  BP: (!) 167/119 (!) 179/134 (!) 175/136 (!) 183/135  Pulse: 92 88 95 91  Resp: 16 16 17 17   Temp: 97.8 F (36.6 C) 98.3 F (36.8 C) 98.1 F (36.7 C) 98.1 F (36.7 C)  TempSrc: Oral Oral Oral Oral  SpO2: 100% 98% 99% 98%  Weight:      Height:       Physical Exam: General:Pleasant female sitting up in hospital bed,dependant ecchymosis on BL breasts s/p TDC placement - improving,NAD. Heart:RRR, S1/S2 distinct. Nomurmur, gallop, or rub. Lungs:CTABL. No increased work of breathing. Abdomen:Soft, non-tender, and non-distended. Extremities:No LE edema. DP 2+ and equal BL. Dialysis Access:Site of R IJ TDC removal covered with bandages. Tenderness reported, no surrounding erythema. PD catheter in abdomen bandaged, nontender.   Filed Weights   12/29/19 0735 12/29/19 1057 01/01/20 0600  Weight: 54.4 kg 57 kg 57 kg   No intake or output data in the 24 hours ending 01/02/20 1030  Additional Objective Labs: Basic Metabolic Panel: Recent Labs  Lab  12/30/19 0256 12/31/19 0314 01/02/20 0237  NA 138 133* 139  K 4.4 3.7 4.1  CL 100 96* 102  CO2 19* 26 22  GLUCOSE 103* 123* 100*  BUN 102* 28* 56*  CREATININE 24.30* 9.75* 13.89*  CALCIUM 7.5* 7.4* 8.5*  PHOS >30.0* 6.4* 9.6*   Liver Function Tests: Recent Labs  Lab 12/29/19 0116 12/30/19 0256 12/30/19 0855 12/31/19 0314 01/02/20 0237  AST 12*  --  16  --   --   ALT 12  --  15  --   --   ALKPHOS 123  --  107  --   --   BILITOT 0.8  --  0.6  --   --   PROT 6.0*  --  5.4*  --   --   ALBUMIN 3.1*   < > 2.5* 2.3* 2.4*   < > = values in this interval not displayed.   No results for input(s): LIPASE, AMYLASE in the last 168 hours. CBC: Recent Labs  Lab 12/29/19 0239 12/29/19 0239 12/30/19 0256 12/30/19 0256 12/30/19 0855 12/31/19 0314 01/02/20 0237  WBC 10.1   < > 5.3  --   --  5.3 6.4  NEUTROABS 7.9*  --   --   --   --   --   --   HGB 7.2*   < > 6.2*   < > 7.9* 8.5* 7.5*  HCT 23.3*   < > 19.2*   < > 23.9* 24.9* 23.0*  MCV 97.1  --  91.4  --   --  91.2 91.6  PLT 89*   < > 83*  --   --  80* 132*   < > = values in this interval not displayed.   Blood Culture    Component Value Date/Time   SDES BLOOD RIGHT ARM 12/31/2019 1158   SPECREQUEST  12/31/2019 1158    BOTTLES DRAWN AEROBIC AND ANAEROBIC Blood Culture results may not be optimal due to an inadequate volume of blood received in culture bottles   CULT  12/31/2019 1158    NO GROWTH 2 DAYS Performed at [facility], [place] [address], [place], [place] [address]    REPTSTATUS PENDING 12/31/2019 1158    Cardiac Enzymes: No results for input(s): CKTOTAL, CKMB, CKMBINDEX, TROPONINI in the last 168 hours. CBG: Recent Labs  Lab 01/01/20 0620 01/01/20 1114 01/01/20 1613 01/01/20 2153 01/02/20 1002  GLUCAP 93 86 77 112* 71   Iron Studies: No results for input(s): IRON, TIBC, TRANSFERRIN, FERRITIN in the last 72 hours. Lab Results  Component Value Date   INR 1.2 12/30/2019   INR 1.2 12/29/2019   INR  1.2 10/21/2019   Studies/Results: No results found.  Medications:  . [MAR Hold] sodium chloride   Intravenous Once  . [MAR Hold] amLODipine  10 mg Oral Daily  . [MAR Hold] carvedilol  6.25 mg Oral BID WC  . [MAR Hold] Chlorhexidine Gluconate Cloth  6 each Topical Q0600  . [MAR Hold] darbepoetin (ARANESP) injection - DIALYSIS  100 mcg Intravenous Q Mon-HD  . [MAR Hold] feeding supplement (ENSURE ENLIVE)  237 mL Oral BID BM  . [MAR Hold] heparin  5,000 Units Subcutaneous Q8H  . [MAR Hold] insulin aspart  0-9 Units Subcutaneous TID WC  . [MAR Hold] lidocaine (PF)  5 mL Intradermal Once  . [MAR Hold] losartan  100 mg Oral QHS  . [MAR Hold] multivitamin  1 tablet Oral QHS  . [MAR Hold] mycophenolate  500 mg Oral BID  . [MAR Hold] ramelteon  8 mg Oral QHS  . [MAR Hold] sevelamer carbonate  1,600 mg Oral TID WC  . [MAR Hold] tacrolimus  2 mg Oral BID  . [MAR Hold] vancomycin variable dose per unstable renal function (pharmacist dosing)   Does not apply See admin instructions    Dialysis Orders 12/30/19: Re-establishing HD orders after poor adherence to home PD. Still start OP HD at [facility] 01/07/20 on TTS schedule.   12/30/19: 2 K bath, 2.25 Ca, 300/600, 3.5 hours via TDC, EDW 59 kg, no UF, heparin 2000 - Aranesp given 12/30/19. Due again 01/06/20.    Assessment/Plan: 1. MRSA Bacteremia: Peripheral BCx 12/29/19 positive for MRSA. Possibly associated with R IJ TDC. IV vanc through 01/06/20. Vascular removed [facility] 12/31/19 for 48-hour line holiday. BCx show NGTD. Placement of new TDC today in the OR with Dr. Donnetta Hutching. - Plan for follow up for PD removal upon discharge.   2.Hyperkalemia: Severe with EKG changes on admit, s/p temporizing measures in ED then urgent HD overnight. K4.1 today.   3. ESRD:Hx failed kidney Tx, still on immunosuppressants. Difficulty adhering to home PD, now transitioning to HD. BUN > 200 on admit. - Last HD 4/26. HD time successfully extended and better  clearance achieved in preparation for 48-hour line holiday. Depending on timing of [facility] placement/HD schedule for today, will plan for HD today vs. tomorrow - HD likely tomorrow - Garth Bigness, LCSW confirmed patient's seat at new OP HD [facility]). Accepted there by [facility] team (Dr. Smith Mince). She will start 01/07/20. Scheduled for 12:35 PM TTS arriving at 12:15 PM.   4.HTN/volume:CXR  4/24 clear. Lungs clear on exam, no extremity edema. Hypertensive to 160/110 this morning. Patient on amlodipine 10 mg and losartan 100 mg. Carvedilol 6.25mg  BID added yesterday. Will monitor.   5. Anemia of CKD:Hgb 6.2on admission. Gave1U PRBCsand Aranesp 100 mcg 4/26.Hgb 7.5 today.Tsat 89% (high) - avoid IV iron. Due for Aranesp again 5/3.   6. Secondary hyperparathyroidism:CorrCa ok, Phosup to 9.6 today (>30 on admission).Continue Renvela. Avoid calcium-containing binders.    7. Nutrition:Alb2.3, Nepro supplementsgiven.   8. Dependant ecchymoses of breast: Appear to be dependant bruising from [facility] placement rather than calciphylaxis. Improving today.    Marisa Sprinkles, PA-S [place] [facility]

## 2020-01-02 NOTE — Op Note (Signed)
    OPERATIVE REPORT  DATE OF SURGERY: 01/02/2020  PATIENT: Tara Mills, 24 y.o. female MRN: [id number]  DOB: 1995/11/24  PRE-OPERATIVE DIAGNOSIS: End-stage renal disease  POST-OPERATIVE DIAGNOSIS:  Same  PROCEDURE: Left IJ tunneled hemodialysis catheter  SURGEON:  Curt Jews, M.D.  PHYSICIAN ASSISTANT: Nurse  ANESTHESIA: Local with sedation  EBL: per anesthesia record  No intake/output data recorded.  BLOOD ADMINISTERED: none  DRAINS: none  SPECIMEN: none  COUNTS CORRECT:  YES  PATIENT DISPOSITION:  PACU - hemodynamically stable  PROCEDURE DETAILS: The patient was taken operating place evaluation of the area of the left and right neck were imaged with SonoSite ultrasound revealing patent jugular veins bilaterally.  She recently had removal of an infected right IJ catheter.  The left internal jugular vein was widely patent.  The patient's right and left neck and chest were prepped and draped in usual sterile fashion.  With the patient in Trendelenburg position and using local anesthesia the left internal jugular vein was accessed with an 18-gauge needle.  A guidewire was passed centrally and C arm was used to confirm that this was in the venous and was at the level of the right atrium.  The 23 cm catheter was brought onto the field and a separate stab incision was made using local anesthesia at the level of the clavicle.  The catheter was brought through the subcutaneous tunnel to the level of the entry site for the guidewire.  A dilator and peel-away sheath were passed over the guidewire and the dilator and guidewire removed.  The catheter was passed down the peel-away sheath which was removed.  Both lumens flushed and aspirated easily and were locked with 1000 unit/cc heparin.  The catheter was reimaged with C arm.  This showed that the tip was at the level of the distal right atrium and that there was no kinking of the catheter at the shoulder.  The catheter was secured  to the skin with 3-0 nylon stitch and the entry site was closed with a 4 subcuticular Vicryl stitch.  Sterile dressing was applied and the patient was transferred to the recovery room where chest x-rays pending   Tara Mills, M.D., [facility] 01/02/2020 1:33 PM

## 2020-01-02 NOTE — Progress Notes (Signed)
Subjective:  Drina Jobst is a 24 y.o. 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 She mentions some having eye discomfort and had a headache overnight but feels well otherwise. Discussed plan for cath placement today and dialysis either later today or tomorrow. Ms.Manternach expressed understanding.   Objective:  Vital signs in last 24 hours: Vitals:   01/01/20 1202 01/01/20 1820 01/02/20 0033 01/02/20 0549  BP: (!) 167/119 (!) 179/134 (!) 175/136 (!) 183/135  Pulse: 92 88 95 91  Resp: 16 16 17 17   Temp: 97.8 F (36.6 C) 98.3 F (36.8 C) 98.1 F (36.7 C) 98.1 F (36.7 C)  TempSrc: Oral Oral Oral Oral  SpO2: 100% 98% 99% 98%  Weight:      Height:        General: NAD , nl appearance Cardiovascular: Normal rate, regular rhythm.  No murmurs, rubs, or gallops.  Chest: Bilateral bruising on breast improving  Pulmonary : Effort normal, breath sounds normal. No wheezes, rales, or rhonchi Abdominal: soft, nontender,  bowel sounds present Ext: warm extremities, no lower extremity edema   CBC Latest Ref Rng & Units 01/02/2020 12/31/2019 12/30/2019  WBC 4.0 - 10.5 K/uL 6.4 5.3 -  Hemoglobin 12.0 - 15.0 g/dL 7.5(L) 8.5(L) 7.9(L)  Hematocrit 36.0 - 46.0 % 23.0(L) 24.9(L) 23.9(L)  Platelets 150 - 400 K/uL 132(L) 80(L) -   BMP Latest Ref Rng & Units 01/02/2020 12/31/2019 12/30/2019  Glucose 70 - 99 mg/dL 100(H) 123(H) 103(H)  BUN 6 - 20 mg/dL 56(H) 28(H) 102(H)  Creatinine 0.44 - 1.00 mg/dL 13.89(H) 9.75(H) 24.30(H)  Sodium 135 - 145 mmol/L 139 133(L) 138  Potassium 3.5 - 5.1 mmol/L 4.1 3.7 4.4  Chloride 98 - 111 mmol/L 102 96(L) 100  CO2 22 - 32 mmol/L 22 26 19(L)  Calcium 8.9 - 10.3 mg/dL 8.5(L) 7.4(L) 7.5(L)      Assessment/Plan:  Principal Problem:   ESRD needing dialysis (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 Found to have MRSA bacteremia.   ESRD on PD transitioning to HD HD on 4/25 and 4/26, BUN 56 , Cr 8.5, Phos 9.6 on 4/29 . Tunnel cath removed 4/27. Plan for new HD cath placement 4/29. See nephrology consult: plan for removal of PD removal . Starts outpatient HD at [facility] 01/07/20 TTS schedule.  Plan: - Dialysis per nephrology   - [facility] placement with vascular surgery today  Methicillin resistant staph epidermis bacteremia Blood cultures 3/4 bottles positive for Staphylococcus Epidermidis, methicillin resistant . Pharmacy dosing Vancomycin, started 4/26. Tunnel Cath removed 4/27, and repeat blood cultures ngtd at 48hrs . Plan - Follow repeat blood cultures - Vancomycin per pharmacy, first dose 4/26 after HD. Stop date 5/3  Chronic normocytic anemia Thrombocytopenia Chronic anemia and thrombocytopenia.  Patient follows with outpatient hematologist.  Likely secondary to end-stage renal disease, but undergoing further work-up with hematology. Transfused for Hgb 6.2 on 4/26 and received  Aranesp with HD. Hgb 7.5 today Plan: - Trend CBC - Diffuse for hemoglobin less than 7  HTN BP continues to be elevated.  Pulse 90's.  Plan: - Continue amlodipine 10 - Continue losartan 100 mg - Continue Coreg 6.25 BID  DVT prophx: Heparin Diet: Diet with 1200 mL fluid restriction Bowel: N/A Code: Full  Dispo: Anticipated discharge in approximately 1-2 day(s).   [name], MD PGY1

## 2020-01-02 NOTE — Transfer of Care (Signed)
Immediate Anesthesia Transfer of Care Note  Patient: Tara Mills  Procedure(s) Performed: INSERTION OF DIALYSIS CATHETER, left internal jugular (Left Neck)  Patient Location: PACU  Anesthesia Type:MAC  Level of Consciousness: awake and alert   Airway & Oxygen Therapy: Patient Spontanous Breathing  Post-op Assessment: Report given to RN, Post -op Vital signs reviewed and stable and Patient moving all extremities X 4  Post vital signs: Reviewed and stable  Last Vitals:  Vitals Value Taken Time  BP    Temp    Pulse    Resp    SpO2      Last Pain:  Vitals:   01/02/20 1046  TempSrc:   PainSc: 0-No pain      Patients Stated Pain Goal: 4 (64/40/34 7425)  Complications: No apparent anesthesia complications

## 2020-01-02 NOTE — Anesthesia Preprocedure Evaluation (Addendum)
Anesthesia Evaluation  Patient identified by MRN, date of birth, ID band Patient awake    Reviewed: Allergy & Precautions, H&P , NPO status , Patient's Chart, lab work & pertinent test results, reviewed documented beta blocker date and time   History of Anesthesia Complications (+) PONV  Airway Mallampati: III  TM Distance: >3 FB Neck ROM: Full    Dental no notable dental hx. (+) Teeth Intact, Dental Advisory Given   Pulmonary neg pulmonary ROS,    Pulmonary exam normal breath sounds clear to auscultation       Cardiovascular hypertension, Pt. on medications and Pt. on home beta blockers  Rhythm:Regular Rate:Normal     Neuro/Psych negative neurological ROS  negative psych ROS   GI/Hepatic negative GI ROS, Neg liver ROS,   Endo/Other  negative endocrine ROS  Renal/GU ESRF and DialysisRenal disease  negative genitourinary   Musculoskeletal   Abdominal   Peds  Hematology  (+) Blood dyscrasia, anemia ,   Anesthesia Other Findings   Reproductive/Obstetrics negative OB ROS                             Anesthesia Physical Anesthesia Plan  ASA: III  Anesthesia Plan: MAC   Post-op Pain Management:    Induction: Intravenous  PONV Risk Score and Plan: 3 and Ondansetron, Propofol infusion and Midazolam  Airway Management Planned: Simple Face Mask  Additional Equipment:   Intra-op Plan:   Post-operative Plan:   Informed Consent: I have reviewed the patients History and Physical, chart, labs and discussed the procedure including the risks, benefits and alternatives for the proposed anesthesia with the patient or authorized representative who has indicated his/her understanding and acceptance.     Dental advisory given and Dental Advisory Given  Plan Discussed with: CRNA and Anesthesiologist  Anesthesia Plan Comments:        Anesthesia Quick Evaluation

## 2020-01-02 NOTE — Anesthesia Procedure Notes (Signed)
Procedure Name: MAC Date/Time: 01/02/2020 12:39 PM Performed by: Neldon Newport, CRNA Pre-anesthesia Checklist: Patient identified, Emergency Drugs available, Suction available, Patient being monitored and Timeout performed Oxygen Delivery Method: Simple face mask

## 2020-01-02 NOTE — Progress Notes (Signed)
Patient arrived back from PACU. A/O X 4 and patient free from pain. Bed in lowest position and call bell in reach.

## 2020-01-03 DIAGNOSIS — Z992 Dependence on renal dialysis: Secondary | ICD-10-CM

## 2020-01-03 DIAGNOSIS — A4901 Methicillin susceptible Staphylococcus aureus infection, unspecified site: Secondary | ICD-10-CM

## 2020-01-03 DIAGNOSIS — D631 Anemia in chronic kidney disease: Secondary | ICD-10-CM

## 2020-01-03 DIAGNOSIS — D649 Anemia, unspecified: Secondary | ICD-10-CM

## 2020-01-03 DIAGNOSIS — I120 Hypertensive chronic kidney disease with stage 5 chronic kidney disease or end stage renal disease: Secondary | ICD-10-CM

## 2020-01-03 DIAGNOSIS — Z95828 Presence of other vascular implants and grafts: Secondary | ICD-10-CM

## 2020-01-03 DIAGNOSIS — D696 Thrombocytopenia, unspecified: Secondary | ICD-10-CM

## 2020-01-03 LAB — CBC
HCT: 22.9 % — ABNORMAL LOW (ref 36.0–46.0)
Hemoglobin: 7.3 g/dL — ABNORMAL LOW (ref 12.0–15.0)
MCH: 30.2 pg (ref 26.0–34.0)
MCHC: 31.9 g/dL (ref 30.0–36.0)
MCV: 94.6 fL (ref 80.0–100.0)
Platelets: 167 10*3/uL (ref 150–400)
RBC: 2.42 MIL/uL — ABNORMAL LOW (ref 3.87–5.11)
RDW: 15.4 % (ref 11.5–15.5)
WBC: 7.1 10*3/uL (ref 4.0–10.5)
nRBC: 0 % (ref 0.0–0.2)

## 2020-01-03 LAB — RENAL FUNCTION PANEL
Albumin: 2.4 g/dL — ABNORMAL LOW (ref 3.5–5.0)
Anion gap: 16 — ABNORMAL HIGH (ref 5–15)
BUN: 72 mg/dL — ABNORMAL HIGH (ref 6–20)
CO2: 23 mmol/L (ref 22–32)
Calcium: 8.4 mg/dL — ABNORMAL LOW (ref 8.9–10.3)
Chloride: 101 mmol/L (ref 98–111)
Creatinine, Ser: 15.87 mg/dL — ABNORMAL HIGH (ref 0.44–1.00)
GFR calc Af Amer: 3 mL/min — ABNORMAL LOW (ref >60)
GFR calc non Af Amer: 3 mL/min — ABNORMAL LOW (ref >60)
Glucose, Bld: 141 mg/dL — ABNORMAL HIGH (ref 70–99)
Phosphorus: 10.4 mg/dL — ABNORMAL HIGH (ref 2.5–4.6)
Potassium: 4 mmol/L (ref 3.5–5.1)
Sodium: 140 mmol/L (ref 135–145)

## 2020-01-03 LAB — GLUCOSE, CAPILLARY: Glucose-Capillary: 124 mg/dL — ABNORMAL HIGH (ref 70–99)

## 2020-01-03 MED ORDER — ACETAMINOPHEN 325 MG PO TABS
ORAL_TABLET | ORAL | Status: AC
  Filled 2020-01-03: qty 2

## 2020-01-03 MED ORDER — DIPHENHYDRAMINE HCL 50 MG/ML IJ SOLN: 25.0000 mg | Freq: Once | INTRAMUSCULAR | Status: AC

## 2020-01-03 MED ORDER — HEPARIN SODIUM (PORCINE) 1000 UNIT/ML IJ SOLN
INTRAMUSCULAR | Status: AC
  Administered 2020-01-03: 3800 [IU]
  Filled 2020-01-03: qty 4

## 2020-01-03 MED ORDER — DIPHENHYDRAMINE HCL 50 MG/ML IJ SOLN
INTRAMUSCULAR | Status: AC
  Administered 2020-01-03: 25 mg via INTRAVENOUS
  Filled 2020-01-03: qty 1

## 2020-01-03 MED ORDER — VANCOMYCIN HCL IN DEXTROSE 500-5 MG/100ML-% IV SOLN
INTRAVENOUS | Status: AC
  Administered 2020-01-03: 500 mg via INTRAVENOUS
  Filled 2020-01-03: qty 100

## 2020-01-03 MED ORDER — VANCOMYCIN HCL IN DEXTROSE 500-5 MG/100ML-% IV SOLN: 500.0000 mg | INTRAVENOUS | Status: AC

## 2020-01-03 NOTE — Progress Notes (Signed)
Subjective:  Eeva Schlosser is a 24 y.o. 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  She reports cramps which is something she has experienced during dialysis before.  Discussed plan for discharge today. All question and concerns addressed.    Objective:  Vital signs in last 24 hours: Vitals:   01/02/20 1419 01/02/20 1434 01/02/20 1812 01/03/20 0024  BP: (!) 143/107 (!) 147/113 (!) 150/109 (!) 173/114  Pulse: 84 83 78 75  Resp: 17 15 17 16   Temp:  97.9 F (36.6 C) (!) 97.5 F (36.4 C) 98.3 F (36.8 C)  TempSrc:   Oral Oral  SpO2: 92% 93% 100% 95%  Weight:      Height:        General: NAD , nl appearance Cardiovascular: Normal rate, regular rhythm.  No murmurs, rubs, or gallops.  Pulmonary : Effort normal, breath sounds normal. No wheezes, rales, or rhonchi Abdominal: soft, nontender,  bowel sounds present Ext: warm extremities, no lower extremity edema   CBC Latest Ref Rng & Units 01/02/2020 12/31/2019 12/30/2019  WBC 4.0 - 10.5 K/uL 6.4 5.3 -  Hemoglobin 12.0 - 15.0 g/dL 7.5(L) 8.5(L) 7.9(L)  Hematocrit 36.0 - 46.0 % 23.0(L) 24.9(L) 23.9(L)  Platelets 150 - 400 K/uL 132(L) 80(L) -   BMP Latest Ref Rng & Units 01/02/2020 12/31/2019 12/30/2019  Glucose 70 - 99 mg/dL 100(H) 123(H) 103(H)  BUN 6 - 20 mg/dL 56(H) 28(H) 102(H)  Creatinine 0.44 - 1.00 mg/dL 13.89(H) 9.75(H) 24.30(H)  Sodium 135 - 145 mmol/L 139 133(L) 138  Potassium 3.5 - 5.1 mmol/L 4.1 3.7 4.4  Chloride 98 - 111 mmol/L 102 96(L) 100  CO2 22 - 32 mmol/L 22 26 19(L)  Calcium 8.9 - 10.3 mg/dL 8.5(L) 7.4(L) 7.5(L)     Assessment/Plan:  Principal Problem:   ESRD needing dialysis ([place]) Active Problems:   Acute on chronic anemia   (HFpEF) heart failure with preserved ejection  fraction (HCC)   Hyperphosphatemia   Hyperkalemia   Malnutrition of moderate degree  [place] PMH of chronic anemia requiring transfusions, HTN, HFpEF, 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 Found to have MRSA bacteremia.   ESRD on PD transitioning to HD Last HD , 4/30. Tunnel cath removed 4/27.[place] placement on 4/29 .See nephrology consult: plan for outpatient removal of [place].  Starts outpatient HD at [facility] 01/07/20 TTS schedule.  . Plan: - Dialysis per nephrology   - Vascular outpatient follow up for access planning.  Methicillin resistant staph epidermis bacteremia Blood cultures 3/4 bottles positive for Staphylococcus Epidermidis, methicillin resistant . Pharmacy dosing Vancomycin, started 4/26. Tunnel Cath removed 4/27, and repeat blood cultures ngtd at 48hrs . Plan - Follow repeat blood cultures - Vancomycin per pharmacy, first dose 4/26 after HD. Stop date 5/3  Chronic normocytic anemia Thrombocytopenia Chronic anemia and thrombocytopenia.  Patient follows with outpatient hematologist.  Likely secondary to end-stage renal disease, but undergoing further work-up with hematology. Transfused for Hgb 6.2 on 4/26 and received Aranesp with HD. Hgb stable today.  Plan: - Outpatient follow up with hematologist - Diffuse for hemoglobin less than 7  HTN BP continues to be elevated.  Pulse 90's.  Plan: - Continue amlodipine 10 - Continue losartan 100 mg - Continue Coreg 6.25 BID  DVT prophx: Heparin Diet: Diet with 1200 mL fluid restriction Bowel: N/A Code: Full  Dispo: Anticipated discharge today.  [name], MD PGY1

## 2020-01-03 NOTE — Discharge Summary (Signed)
Name: Tara Mills MRN: [id number] DOB: [birth year] 24 y.o. PCP: [place]  Date of Admission: 12/28/2019  9:41 PM Date of Discharge:  01/03/2020 Attending Physician: Sid Falcon, MD  Discharge Diagnosis: 1. ESRD needing dialysis 2. Acute on Chronic anemia 3. MSSA Bacteremia  Discharge Medications: Allergies as of 01/03/2020       Reactions   Nsaids Anaphylaxis, Other (See Comments)   Due to ESRD   Grapefruit Extract Other (See Comments)   Per pt: can't have because of medications        Medication List     TAKE these medications    amLODipine 10 MG tablet Commonly known as: NORVASC Please take 1 tablet by mouth daily   BiDil 20-37.5 MG tablet Generic drug: isosorbide-hydrALAZINE Take 2 tablets by mouth 3 (three) times daily.   cinacalcet 30 MG tablet Commonly known as: SENSIPAR Take 30 mg by mouth daily.   Claritin 10 MG tablet Generic drug: loratadine Take 10 mg by mouth daily as needed for allergies.   diphenhydrAMINE 25 mg capsule Commonly known as: BENADRYL Take 25 mg by mouth every 6 (six) hours as needed for allergies or sleep.   famotidine 20 MG tablet Commonly known as: PEPCID Take 20 mg by mouth at bedtime.   gentamicin cream 0.1 % Commonly known as: GARAMYCIN Apply 1 application topically daily.   labetalol 200 MG tablet Commonly known as: NORMODYNE Take 1 tablet (200 mg total) by mouth 3 (three) times daily.   lactulose 10 GM/15ML solution Commonly known as: CHRONULAC Take 30 mLs by mouth as needed for mild constipation.   losartan 100 MG tablet Commonly known as: COZAAR Take 100 mg by mouth at bedtime.   MiraLax 17 GM/SCOOP powder Generic drug: polyethylene glycol powder Take 17 g by mouth daily as needed for moderate constipation.   mycophenolate 500 MG tablet Commonly known as: CELLCEPT Take 1,000 mg by mouth 2 (two) times daily.   Nac 600 600 MG Caps Generic drug: Acetylcysteine Take 600  mg by mouth daily as needed (shortness of breath).   ramelteon 8 MG tablet Commonly known as: ROZEREM Take 1 tablet (8 mg total) by mouth at bedtime.   sevelamer carbonate 800 MG tablet Commonly known as: RENVELA Take 1,600 mg by mouth 3 (three) times daily with meals.   tacrolimus 1 MG capsule Commonly known as: PROGRAF Take 2 mg by mouth 2 (two) times daily.        Disposition and follow-up:   [name] was discharged from [facility] in Stable condition.  At the hospital follow up visit please address:  1.   ESRD - Patient needs to follow up with vascular surgery for access planning - Needs removal of PDC  Chronic anemia -Patient required transfusion on admission and my understanding is anemia is secondary to her ESRD. However, she was being worked up by hematology and I recommend having patient follow up with her hematologist to complete workup.    2.  Labs / imaging needed at time of follow-up: na  3.  Pending labs/ test needing follow-up: na  Follow-up Appointments: Follow-up [place] Follow up.   Why: Appointment made w/ Garnetta Buddy, PA. Your appointment is at 1:40pm, please wear a mask and call if any changes need to be made.   Paperwork to be sent to your home address.  Contact information: [place]         Marty Heck, MD Follow up.   Specialty: Vascular Surgery Why: office will contact patient for follow up appointment Contact information: [place] [phone]            Hospital Course by problem list: 1.  ESRD on PD transitioning to HD Last HD , 4/30. Tunnel cath removed 4/27.[place] placement on 4/29 .See nephrology consult: plan for outpatient removal of [place].  Starts outpatient HD at [facility] 01/07/20 TTS schedule.  .   Methicillin resistant staph epidermis  bacteremia Blood cultures 3/4 bottles positive for Staphylococcus Epidermidis, methicillin resistant . Pharmacy dosing Vancomycin, started 4/26. Tunnel Cath removed 4/27, and repeat blood cultures negative.. - Vancomycin per pharmacy, first dose 4/26 after HD. Stop date 5/3  Chronic normocytic anemia Thrombocytopenia Chronic anemia and thrombocytopenia.  Patient follows with outpatient hematologist.  Likely secondary to end-stage renal disease, but undergoing further work-up with hematology. Transfused for Hgb 6.2 on 4/26 and received Aranesp with HD. Hgb stable today.  Plan: - Outpatient follow up with hematologist  HTN BP continues to be elevated.  Pulse 90's.  Plan: - Continue amlodipine 10 - Continue losartan 100 mg - Continue Coreg 6.25 BID  Discharge Vitals:   BP (!) 176/114   Pulse (!) 102   Temp 98 F (36.7 C) (Oral)   Resp 19   Ht 5' (1.524 m)   Wt 58.6 kg Comment: standing  LMP 12/02/2019 (Approximate)   SpO2 99%   BMI 25.23 kg/m   Pertinent Labs, Studies, and Procedures:   CBC Latest Ref Rng & Units 01/03/2020 01/02/2020 12/31/2019  WBC 4.0 - 10.5 K/uL 7.1 6.4 5.3  Hemoglobin 12.0 - 15.0 g/dL 7.3(L) 7.5(L) 8.5(L)  Hematocrit 36.0 - 46.0 % 22.9(L) 23.0(L) 24.9(L)  Platelets 150 - 400 K/uL 167 132(L) 80(L)   BMP Latest Ref Rng & Units 01/03/2020 01/02/2020 12/31/2019  Glucose 70 - 99 mg/dL 141(H) 100(H) 123(H)  BUN 6 - 20 mg/dL 72(H) 56(H) 28(H)  Creatinine 0.44 - 1.00 mg/dL 15.87(H) 13.89(H) 9.75(H)  Sodium 135 - 145 mmol/L 140 139 133(L)  Potassium 3.5 - 5.1 mmol/L 4.0 4.1 3.7  Chloride 98 - 111 mmol/L 101 102 96(L)  CO2 22 - 32 mmol/L 23 22 26   Calcium 8.9 - 10.3 mg/dL 8.4(L) 8.5(L) 7.4(L)     Discharge Instructions: Discharge Instructions     Diet - low sodium heart healthy   Complete by: As directed    Increase activity slowly   Complete by: As directed        Signed: [name], MD PGY1

## 2020-01-03 NOTE — Progress Notes (Addendum)
  Progress Note    01/03/2020 7:48 AM 1 Day Post-Op  Subjective:  Patient seen in HD unit. Left neck and chest soreness. States it feels much better than right IJ TDC did. Still some soreness of right neck and chest   Vitals:   01/03/20 0700 01/03/20 0705  BP: (!) 178/123 (!) 180/127  Pulse: 87 84  Resp:    Temp:    SpO2:     Physical Exam: General: Cardiac:  regular Lungs:  Non labored Incisions:  Left IJ TDC site clean, dry and intact. Running well . Right IJ TDC previous site healing well. Some erythema and superficial abrasions present Neurologic: alert and oriented  CBC    Component Value Date/Time   WBC 7.1 01/03/2020 0707   RBC 2.42 (L) 01/03/2020 0707   HGB 7.3 (L) 01/03/2020 0707   HCT 22.9 (L) 01/03/2020 0707   PLT 167 01/03/2020 0707   MCV 94.6 01/03/2020 0707   MCH 30.2 01/03/2020 0707   MCHC 31.9 01/03/2020 0707   RDW 15.4 01/03/2020 0707   LYMPHSABS 1.0 12/29/2019 0239   MONOABS 0.8 12/29/2019 0239   EOSABS 0.1 12/29/2019 0239   BASOSABS 0.1 12/29/2019 0239    BMET    Component Value Date/Time   NA 139 01/02/2020 0237   K 4.1 01/02/2020 0237   CL 102 01/02/2020 0237   CO2 22 01/02/2020 0237   GLUCOSE 100 (H) 01/02/2020 0237   BUN 56 (H) 01/02/2020 0237   CREATININE 13.89 (H) 01/02/2020 0237   CALCIUM 8.5 (L) 01/02/2020 0237   GFRNONAA 3 (L) 01/02/2020 0237   GFRAA 4 (L) 01/02/2020 0237    INR    Component Value Date/Time   INR 1.2 12/30/2019 0855     Intake/Output Summary (Last 24 hours) at 01/03/2020 0748 Last data filed at 01/02/2020 1338 Gross per 24 hour  Intake 678.54 ml  Output 20 ml  Net 658.54 ml     Assessment/Plan:  24 y.o. female is s/p L IJ TDC 1 Day Post-Op. Doing well post insertion of new dialysis catheter. Not having any pain. Will schedule follow up as outpatient for planning permanent dialysis access    Marval Regal [facility] [phone] 01/03/2020 7:48 AM   I have seen and  evaluated the patient. I agree with the PA note as documented above. POD#1 s/p LIJ tunneled catheter after line holiday.  Working well in dialysis.  No apparent issues.  Outpatient f/u for access planning.  Marty Heck, MD [facility] [place] Office: [phone]

## 2020-01-03 NOTE — Procedures (Signed)
Patient seen on Hemodialysis. BP (!) 164/102   Pulse 92   Temp 98 F (36.7 C) (Oral)   Resp 19   Ht 5' (1.524 m)   Wt 58.6 kg Comment: standing  LMP 12/02/2019 (Approximate)   SpO2 99%   BMI 25.23 kg/m   QB 400, UF goal 1.2L Tolerating treatment with complaints of cramping in her legs and lamenting the time of treatment. RN hanging Vancomycin and will give 25mg  IV benadryl with HD today.  Stable to DC home today from renal standpoint for OP TTS HD at [facility]    Elmarie Shiley MD [facility]. Office # [phone] Pager # [phone] 10:02 AM

## 2020-01-03 NOTE — Progress Notes (Signed)
Nsg Discharge Note  Patient to be discharged home. Awaiting transportation at this time. Discharge info. reviewed.  Admit Date:  12/28/2019 Discharge date: 01/03/2020   Destony Prevost to be D/C'd Home per MD order.  AVS completed.  Copy for chart, and copy for patient signed, and dated. Patient/caregiver able to verbalize understanding.  Discharge Medication: Allergies as of 01/03/2020      Reactions   Nsaids Anaphylaxis, Other (See Comments)   Due to ESRD   Grapefruit Extract Other (See Comments)   Per pt: can't have because of medications      Medication List    TAKE these medications   amLODipine 10 MG tablet Commonly known as: NORVASC Please take 1 tablet by mouth daily   BiDil 20-37.5 MG tablet Generic drug: isosorbide-hydrALAZINE Take 2 tablets by mouth 3 (three) times daily.   cinacalcet 30 MG tablet Commonly known as: SENSIPAR Take 30 mg by mouth daily.   Claritin 10 MG tablet Generic drug: loratadine Take 10 mg by mouth daily as needed for allergies.   diphenhydrAMINE 25 mg capsule Commonly known as: BENADRYL Take 25 mg by mouth every 6 (six) hours as needed for allergies or sleep.   famotidine 20 MG tablet Commonly known as: PEPCID Take 20 mg by mouth at bedtime.   gentamicin cream 0.1 % Commonly known as: GARAMYCIN Apply 1 application topically daily.   labetalol 200 MG tablet Commonly known as: NORMODYNE Take 1 tablet (200 mg total) by mouth 3 (three) times daily.   lactulose 10 GM/15ML solution Commonly known as: CHRONULAC Take 30 mLs by mouth as needed for mild constipation.   losartan 100 MG tablet Commonly known as: COZAAR Take 100 mg by mouth at bedtime.   MiraLax 17 GM/SCOOP powder Generic drug: polyethylene glycol powder Take 17 g by mouth daily as needed for moderate constipation.   mycophenolate 500 MG tablet Commonly known as: CELLCEPT Take 1,000 mg by mouth 2 (two) times daily.   Nac 600 600 MG Caps Generic drug:  Acetylcysteine Take 600 mg by mouth daily as needed (shortness of breath).   ramelteon 8 MG tablet Commonly known as: ROZEREM Take 1 tablet (8 mg total) by mouth at bedtime.   sevelamer carbonate 800 MG tablet Commonly known as: RENVELA Take 1,600 mg by mouth 3 (three) times daily with meals.   tacrolimus 1 MG capsule Commonly known as: PROGRAF Take 2 mg by mouth 2 (two) times daily.       Discharge Assessment: Vitals:   01/03/20 1101 01/03/20 1149  BP: (!) 182/112 (!) 184/128  Pulse: 98 93  Resp:  18  Temp: 97.8 F (36.6 C) 98 F (36.7 C)  SpO2: 98% 99%   Skin clean, dry and intact without evidence of skin break down, no evidence of skin tears noted. IV catheter discontinued intact. Site without signs and symptoms of complications - no redness or edema noted at insertion site, patient denies c/o pain - only slight tenderness at site.  Dressing with slight pressure applied.  D/c Instructions-Education: Discharge instructions given to patient/family with verbalized understanding. D/c education completed with patient/family including follow up instructions, medication list, d/c activities limitations if indicated, with other d/c instructions as indicated by MD - patient able to verbalize understanding, all questions fully answered. Patient instructed to return to ED, call 911, or call MD for any changes in condition.  Patient escorted via [place], and D/C home via private auto.  Erasmo Leventhal, RN 01/03/2020 1:49 PM

## 2020-01-03 NOTE — Progress Notes (Signed)
Renal Navigator met with patient to ensure that she is aware of her new OP HD clinic information and schedule. Schedule letter provided. Patient states no questions or concerns. She is aware and states her fiance will provide transportation.  Shaw, Colleen Elizabeth, LCSW Renal Navigator [phone] 

## 2020-01-03 NOTE — Progress Notes (Signed)
Pharmacy Antibiotic Note  Tara Mills is a 24 y.o. female with MRSE bacteremia related to dialysis catheter infection. Pharmacy is consulted for Vancomycin dosing.   The patient received a loading dose of Vancomycin on 4/26. Since then the patient had catheter removal, line holiday, and replacement. The patient is receiving HD this morning and a dose is scheduled to be given post-HD  Noted MD plans for 7d LOT with intended stop date of 5/3  Plan: - Vanc 500 mg x 1 dose post HD today - Stop date 5/3 per MD note - Will continue to follow HD schedule/duratiom for additional doses  Height: 5' (152.4 cm) Weight: 58.6 kg (129 lb 3 oz)(standing) IBW/kg (Calculated) : 45.5  Temp (24hrs), Avg:97.8 F (36.6 C), Min:97.5 F (36.4 C), Max:98.3 F (36.8 C)  Recent Labs  Lab 12/29/19 0116 12/29/19 0239 12/29/19 1212 12/30/19 0256 12/31/19 0314 01/02/20 0237 01/03/20 0700 01/03/20 0707  WBC  --  10.1  --  5.3 5.3 6.4  --  7.1  CREATININE 45.04*  --   --  24.30* 9.75* 13.89* 15.87*  --   LATICACIDVEN 1.1  --  1.8  --   --   --   --   --    Allergies  Allergen Reactions  . Nsaids Anaphylaxis and Other (See Comments)    Due to ESRD   . Grapefruit Extract Other (See Comments)    Per pt: can't have because of medications    Antimicrobials this admission:  Vancomycin 4/26>> (5/3)  Dose adjustments this admission:  n/a  Microbiology results: 4/25 blood x 2: Staph epidermidis (MRSE); MIC to Vanc 2 4/26 BCID shows Staph species w/ mecA resistance 4/27 cath tip culture: reincubated > now > 100K/ml Staph epi, expect same MRSE 4/27 blood x 2: ng x 2day to date 4/25 MRSA PCR: negative 4/25 COVID and flu: negative  Thank you for allowing pharmacy to be a part of this patient's care.  Alycia Rossetti, PharmD, BCPS Clinical Pharmacist Clinical phone for 01/03/2020: [phone] 01/03/2020 8:27 AM   **Pharmacist phone directory can now be found on [email or web address] ([email or web address]).  Listed  under [place].

## 2020-01-03 NOTE — Discharge Instructions (Signed)
Thank you for trusting me with your care. To recap you were found to have abnormalities in your blood work requiring multiple HD sessions and also a blood stream infections.  Your labs look much better after multiple dialysis sessions.  In addition you were given antibiotics and will not need further treatment after discharge.   [place] blood counts were low which is a chronic problem.  Follow-up with your hematologist for further management.  In addition you will need to follow-up with the vascular surgeon to plan for dialysis access.  Lastly, follow-up with your primary care doctor at your convenience discussed the recent changes and monitor your blood pressure.

## 2020-01-05 LAB — CULTURE, BLOOD (ROUTINE X 2)
Culture: NO GROWTH
Culture: NO GROWTH
Special Requests: ADEQUATE

## 2020-01-14 ENCOUNTER — Other Ambulatory Visit
Admission: RE | Admit: 2020-01-14 | Discharge: 2020-01-14 | Disposition: A | Discharge status: Home / Self Care | Payer: Medicaid Other | Source: Ambulatory Visit | Attending: Nephrology | Admitting: Nephrology

## 2020-01-14 DIAGNOSIS — D649 Anemia, unspecified: Secondary | ICD-10-CM | POA: Diagnosis not present

## 2020-01-14 LAB — HEMOGLOBIN: Hemoglobin: 6.1 g/dL — ABNORMAL LOW (ref 12.0–15.0)

## 2020-02-07 ENCOUNTER — Other Ambulatory Visit: Payer: Self-pay | Admitting: *Deleted

## 2020-02-07 DIAGNOSIS — N186 End stage renal disease: Secondary | ICD-10-CM

## 2020-02-07 DIAGNOSIS — Z992 Dependence on renal dialysis: Secondary | ICD-10-CM

## 2020-02-11 ENCOUNTER — Encounter: Payer: Medicaid Other | Admitting: Vascular Surgery

## 2020-02-11 ENCOUNTER — Encounter (HOSPITAL_COMMUNITY): Payer: Medicaid Other

## 2020-02-19 ENCOUNTER — Encounter: Payer: Self-pay | Admitting: Surgery

## 2021-12-02 IMAGING — MR MR HEAD W/O CM
22 of 23 series · 45 of 48 positions shown · non-contrast
Comparison: None.

CLINICAL DATA: Headaches and decreased vision. History of end-stage
renal disease on peritoneal dialysis.

EXAM:
MRI HEAD WITHOUT CONTRAST
MRV HEAD WITHOUT CONTRAST
MRI ORBITS WITHOUT CONTRAST
TECHNIQUE: Multiplanar, multiecho pulse sequences of the brain and surrounding
structures were obtained without intravenous contrast. Angiographic
images of the intracranial venous structures were obtained using MRV
technique without intravenous contrast.

[Series 5: DWI · axial · 3.0mm · 0.88mm/px · z∈[-93,+34]mm · 3 of 90 slices shown (1 of 4)]
[im 1/90]
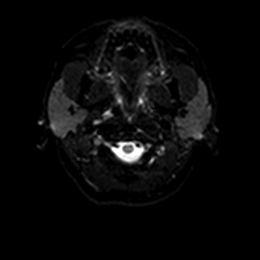
[im 45/90]
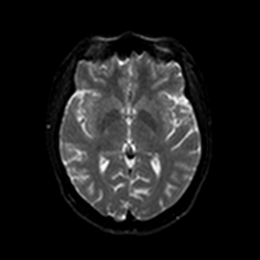
[im 90/90]
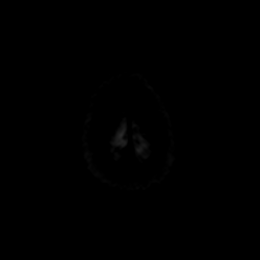

[Series 6: DWI · axial · 3.0mm · 0.88mm/px · z∈[-93,+34]mm · 2 of 45 slices shown (2 of 4)]
[im 1/45]
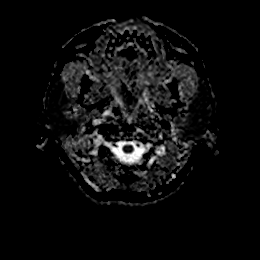
[im 45/45]
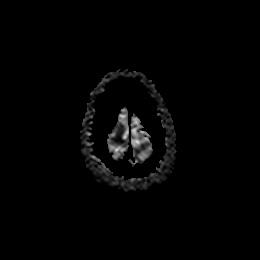

[Series 7: tof_fl2d_paracor · coronal · 2.0mm · 0.98mm/px · 6 of 128 slices shown]
[im 1/128]
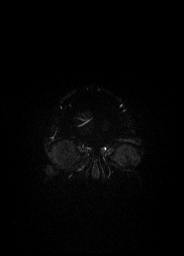
[im 26/128]
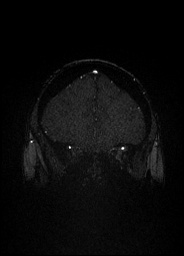
[im 51/128]
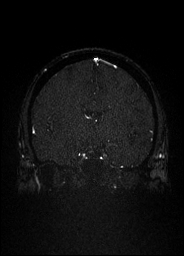
[im 77/128]
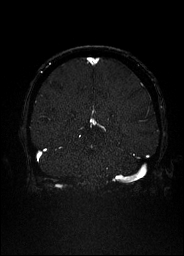
[im 102/128]
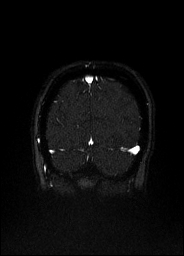
[im 128/128]
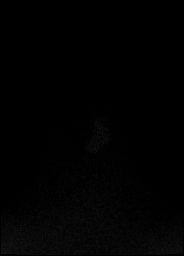

[Series 11: DWI · coronal · 4.0mm · 0.88mm/px · 3 of 64 slices shown (3 of 4)]
[im 1/64]
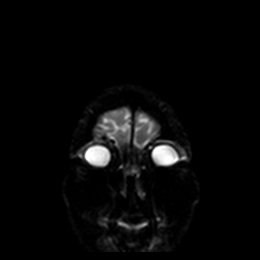
[im 32/64]
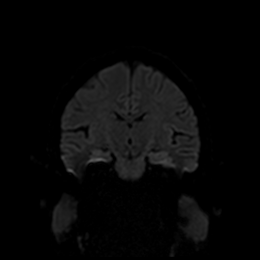
[im 64/64]
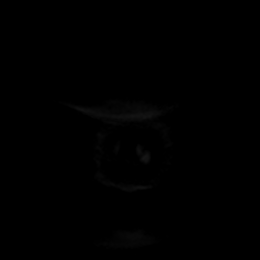

[Series 12: DWI · coronal · 4.0mm · 0.88mm/px · 1 of 32 slices shown (4 of 4)]
[im 1/32]
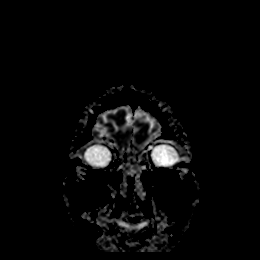

[Series 13: T1 · sagittal · 5.0mm · 0.75mm/px · 1 of 23 slices shown (1 of 3)]
[im 1/23]
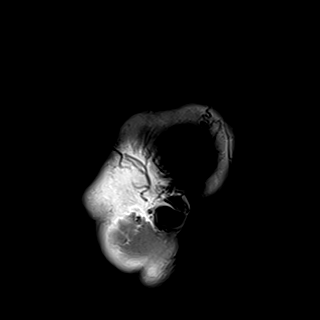

[Series 14: T2 · axial · 5.0mm · 0.72mm/px · 1 of 25 slices shown (1 of 2)]
[im 1/25]
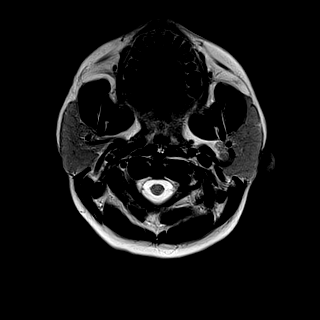

[Series 15: FLAIR · axial · 5.0mm · 0.45mm/px · 1 of 25 slices shown]
[im 1/25]
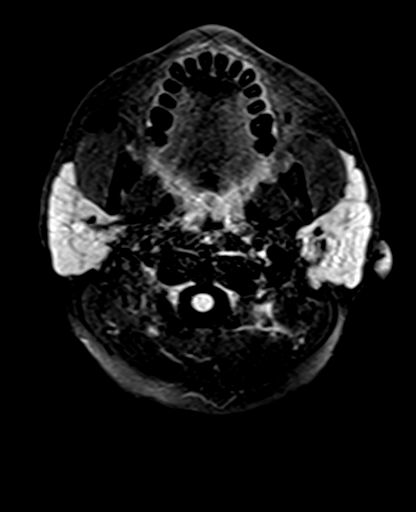

[Series 16: mag_images · axial · 3.0mm · 0.90mm/px · z∈[-109,+61]mm · 3 of 60 slices shown]
[im 1/60]
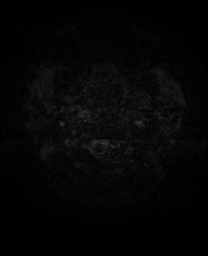
[im 30/60]
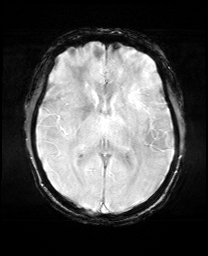
[im 60/60]
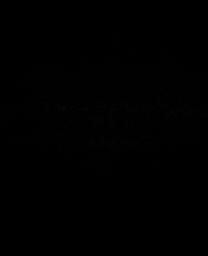

[Series 17: pha_images · axial · 3.0mm · 0.90mm/px · z∈[-109,+44]mm · 2 of 53 slices shown]
[im 1/53]
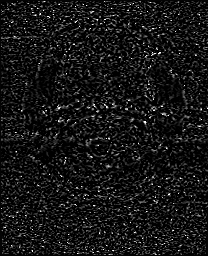
[im 53/53]
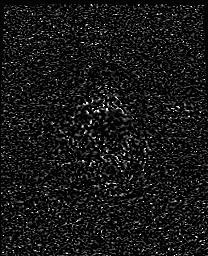

[Series 18: swi_images · axial · 3.0mm · 0.90mm/px · z∈[-109,+61]mm · 3 of 60 slices shown]
[im 1/60]
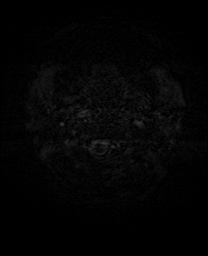
[im 30/60]
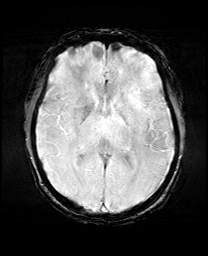
[im 60/60]
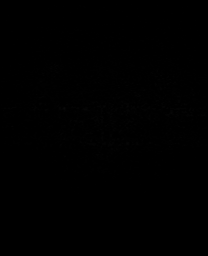

[Series 19: mip_images(sw) · axial · 24.0mm · 0.90mm/px · z∈[-99,+51]mm · 2 of 53 slices shown]
[im 1/53]
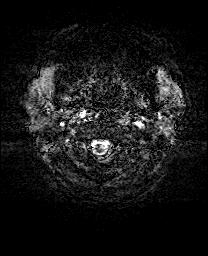
[im 53/53]
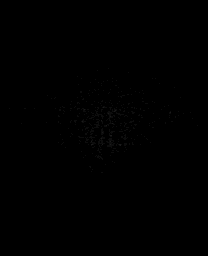

[Series 21: T1 · axial · non-contrast · 3.0mm · 0.37mm/px · 1 of 16 slices shown (2 of 3)]
[im 1/16]
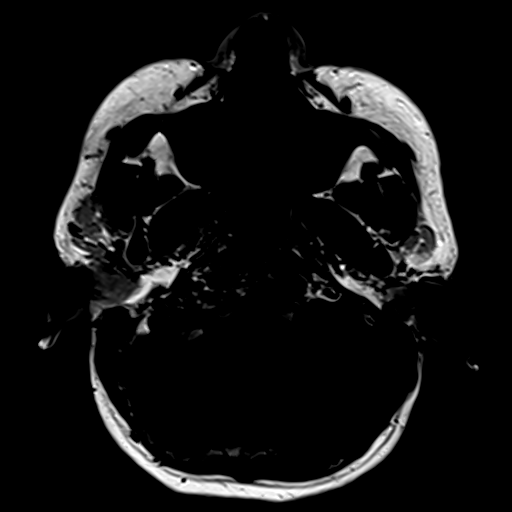

[Series 22: T2 fat-sat · axial · 3.0mm · 0.54mm/px · 1 of 16 slices shown (1 of 6)]
[im 1/16]
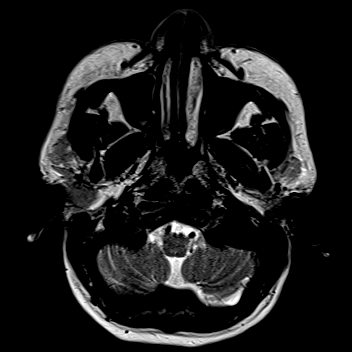

[Series 23: T2 fat-sat · axial · 3.0mm · 0.54mm/px · 1 of 16 slices shown (2 of 6)]
[im 1/16]
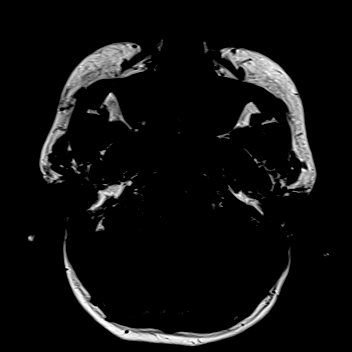

[Series 24: T2 fat-sat · axial · 3.0mm · 0.54mm/px · 1 of 16 slices shown (3 of 6)]
[im 1/16]
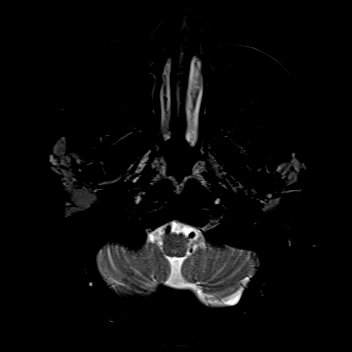

[Series 25: T2 fat-sat · coronal · 3.0mm · 0.54mm/px · 1 of 26 slices shown (4 of 6)]
[im 1/26]
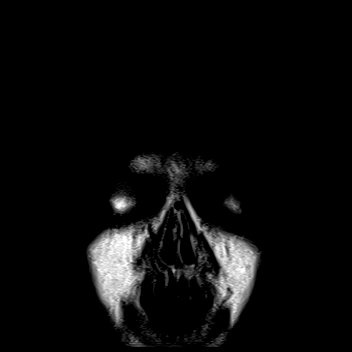

[Series 26: T2 fat-sat · coronal · 3.0mm · 0.54mm/px · 1 of 26 slices shown (5 of 6)]
[im 1/26]
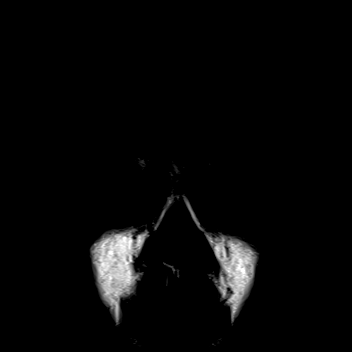

[Series 27: T2 fat-sat · coronal · 3.0mm · 0.54mm/px · 1 of 26 slices shown (6 of 6)]
[im 1/26]
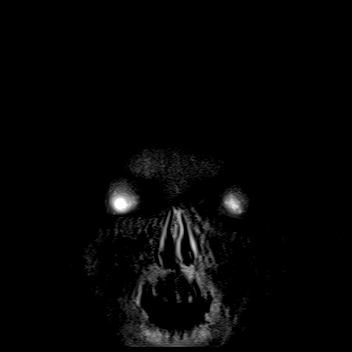

[Series 28: T1 · coronal · 3.0mm · 0.37mm/px · 1 of 26 slices shown (3 of 3)]
[im 1/26]
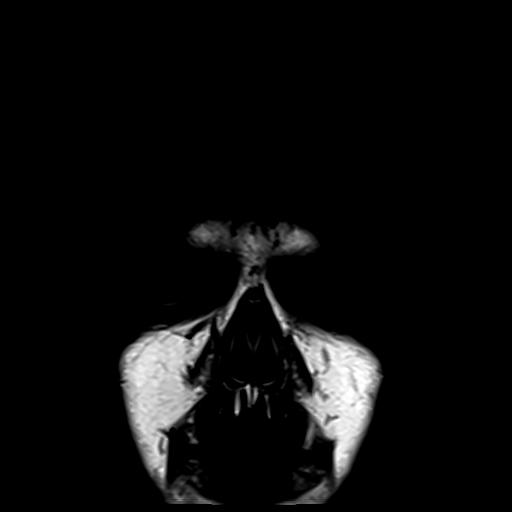

[Series 29: T2 · coronal · 5.0mm · 0.72mm/px · 1 of 28 slices shown (2 of 2)]
[im 1/28]
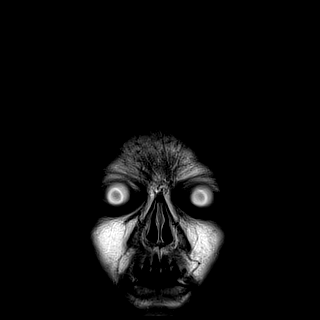

[Series 31: venous inhance coronal_msum · coronal · portal-venous · 0.9mm · 0.57mm/px · 8 of 186 slices shown]
[im 1/186]
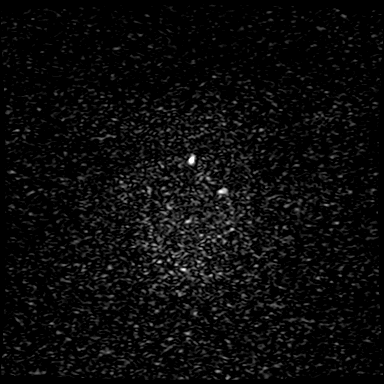
[im 27/186]
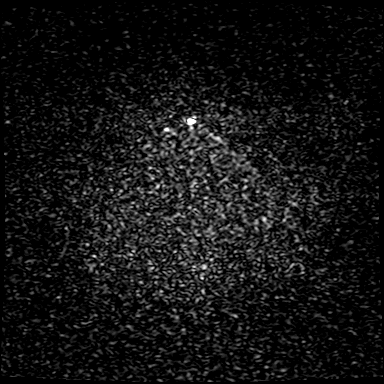
[im 53/186]
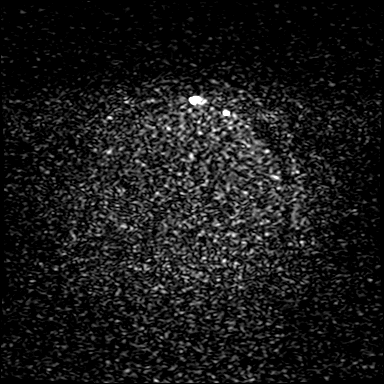
[im 80/186]
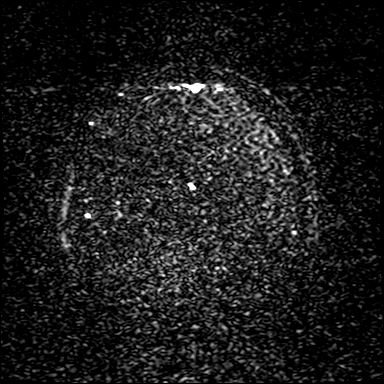
[im 106/186]
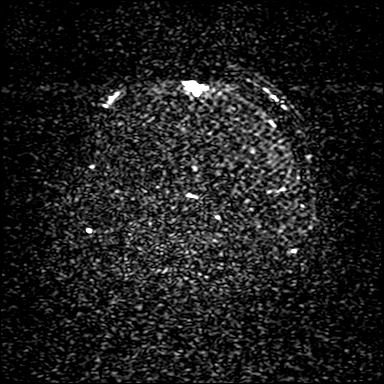
[im 133/186]
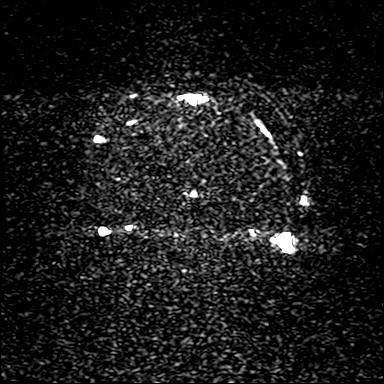
[im 159/186]
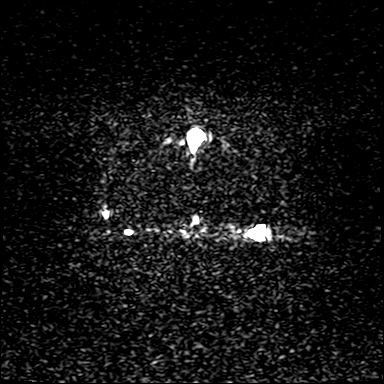
[im 186/186]
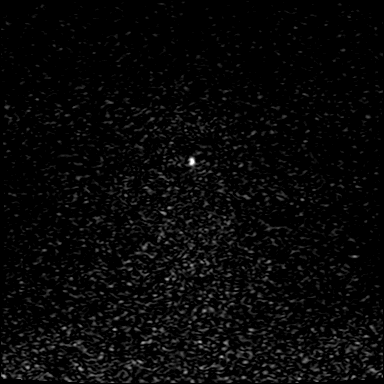

[45 of 48 positions shown; findings below may reference images not displayed]

FINDINGS: MRI HEAD FINDINGS

Brain: No acute infarct, acute hemorrhage or extra-axial collection.
Normal white matter signal. Normal volume of CSF spaces. No chronic
microhemorrhage. Normal midline structures.

Vascular: Normal major arterial flow voids.

Skull and upper cervical spine: Normal.

MRV HEAD FINDINGS

Superior sagittal sinus: Normal.

Straight sinus: Normal.

Inferior sagittal sinus, vein of Wilian and internal cerebral veins:
Normal.

Transverse sinuses: Normal left transverse sinus. Diminutive right
transverse sinus, likely congenital.

Sigmoid sinuses: Normal.

Visualized jugular veins: Normal.

MRI ORBITS FINDINGS

Orbits:

--Globes: Normal.

--Bony orbit: Normal.

--Preseptal soft tissues: Normal.

--Intra- and extraconal orbital fat: Normal. No inflammatory
stranding.

--Optic nerves: Normal.

--Lacrimal glands and fossae: Normal.

--Extraocular muscles: Normal.

Visualized sinuses:  No fluid levels or advanced mucosal thickening.

Soft tissues: Normal.
IMPRESSION: 1. Normal MRI of the brain and orbits.
2. Diminutive right transverse sinus, likely a congenital variant.
Otherwise normal intracranial MRV.

## 2022-01-08 IMAGING — DX DG CHEST 2V
2 series · 2 of 2 positions shown · non-contrast
Comparison: 11/14/2019

CLINICAL DATA: Shortness of breath.

EXAM:
CHEST - 2 VIEW

[chest pa]
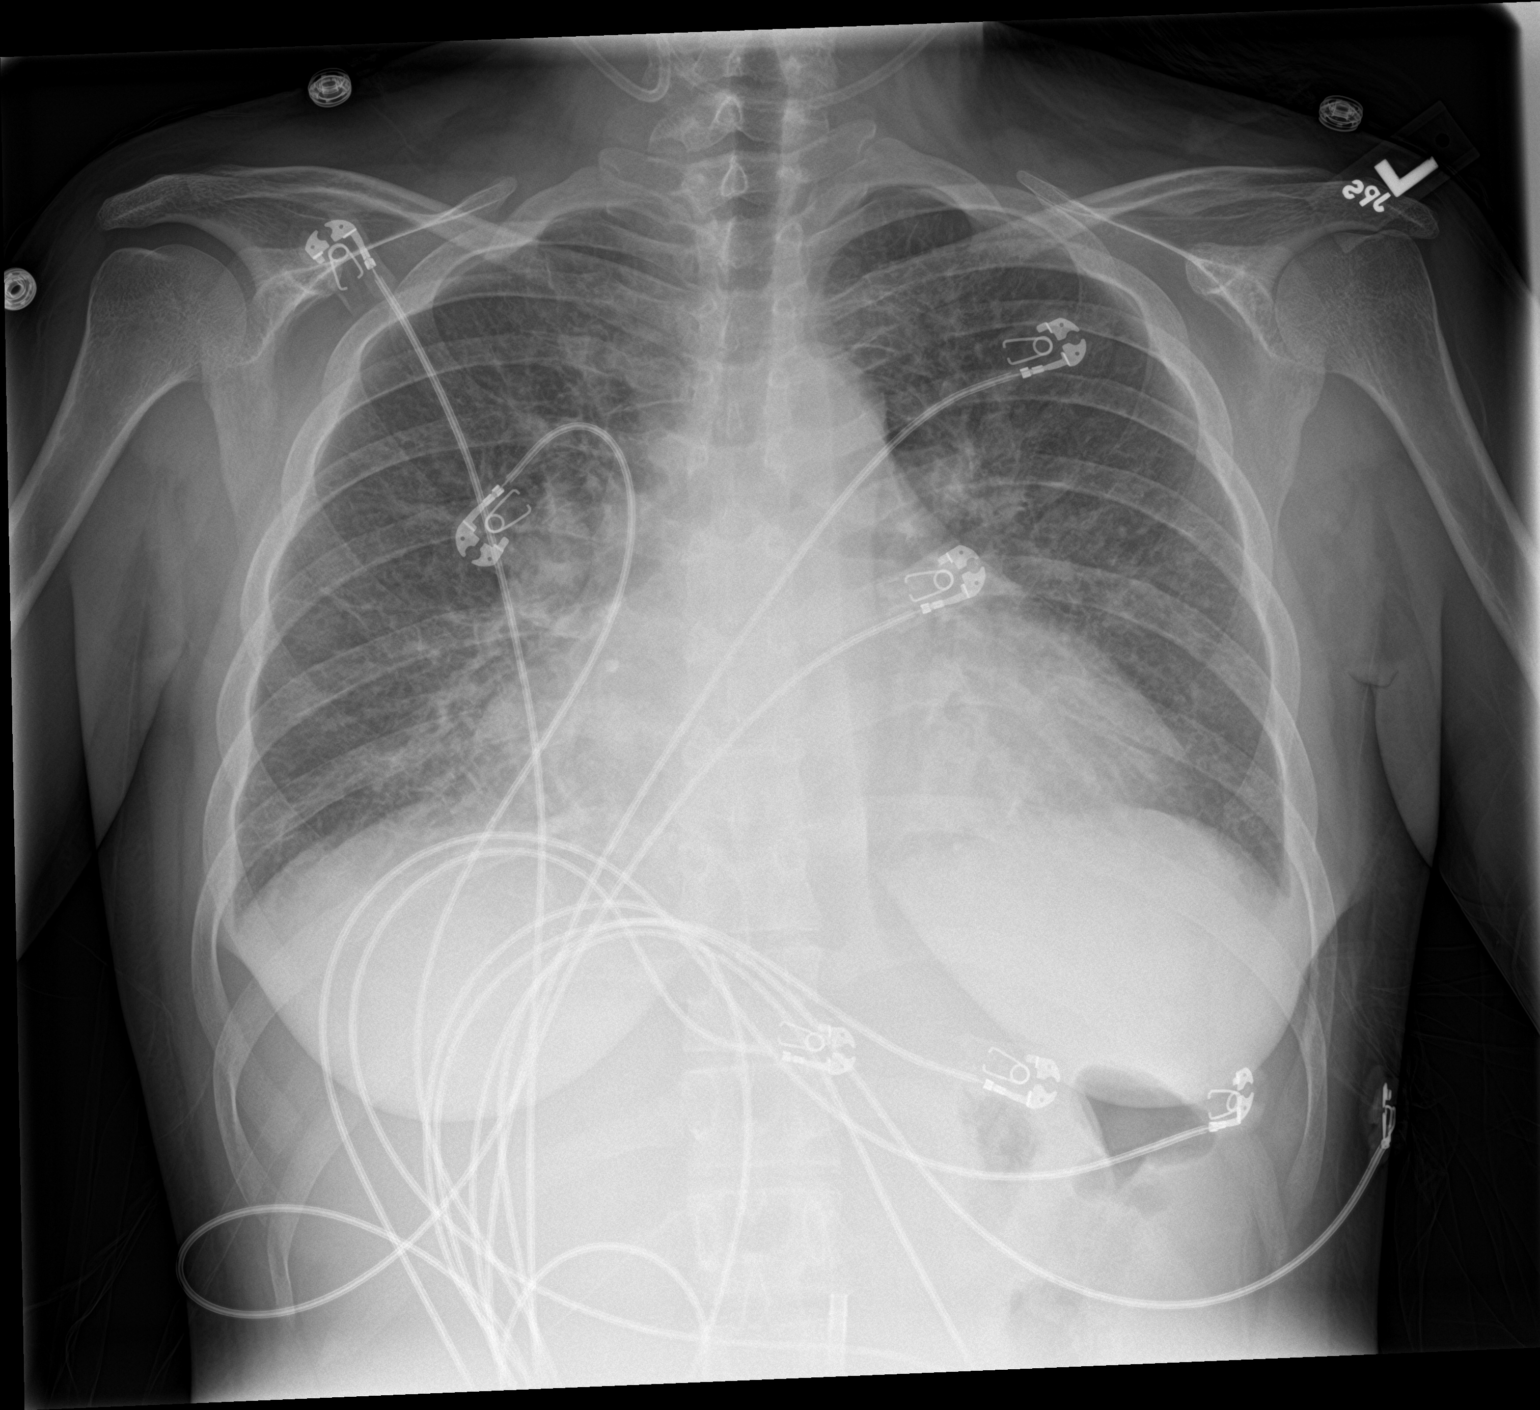

[chest lat]
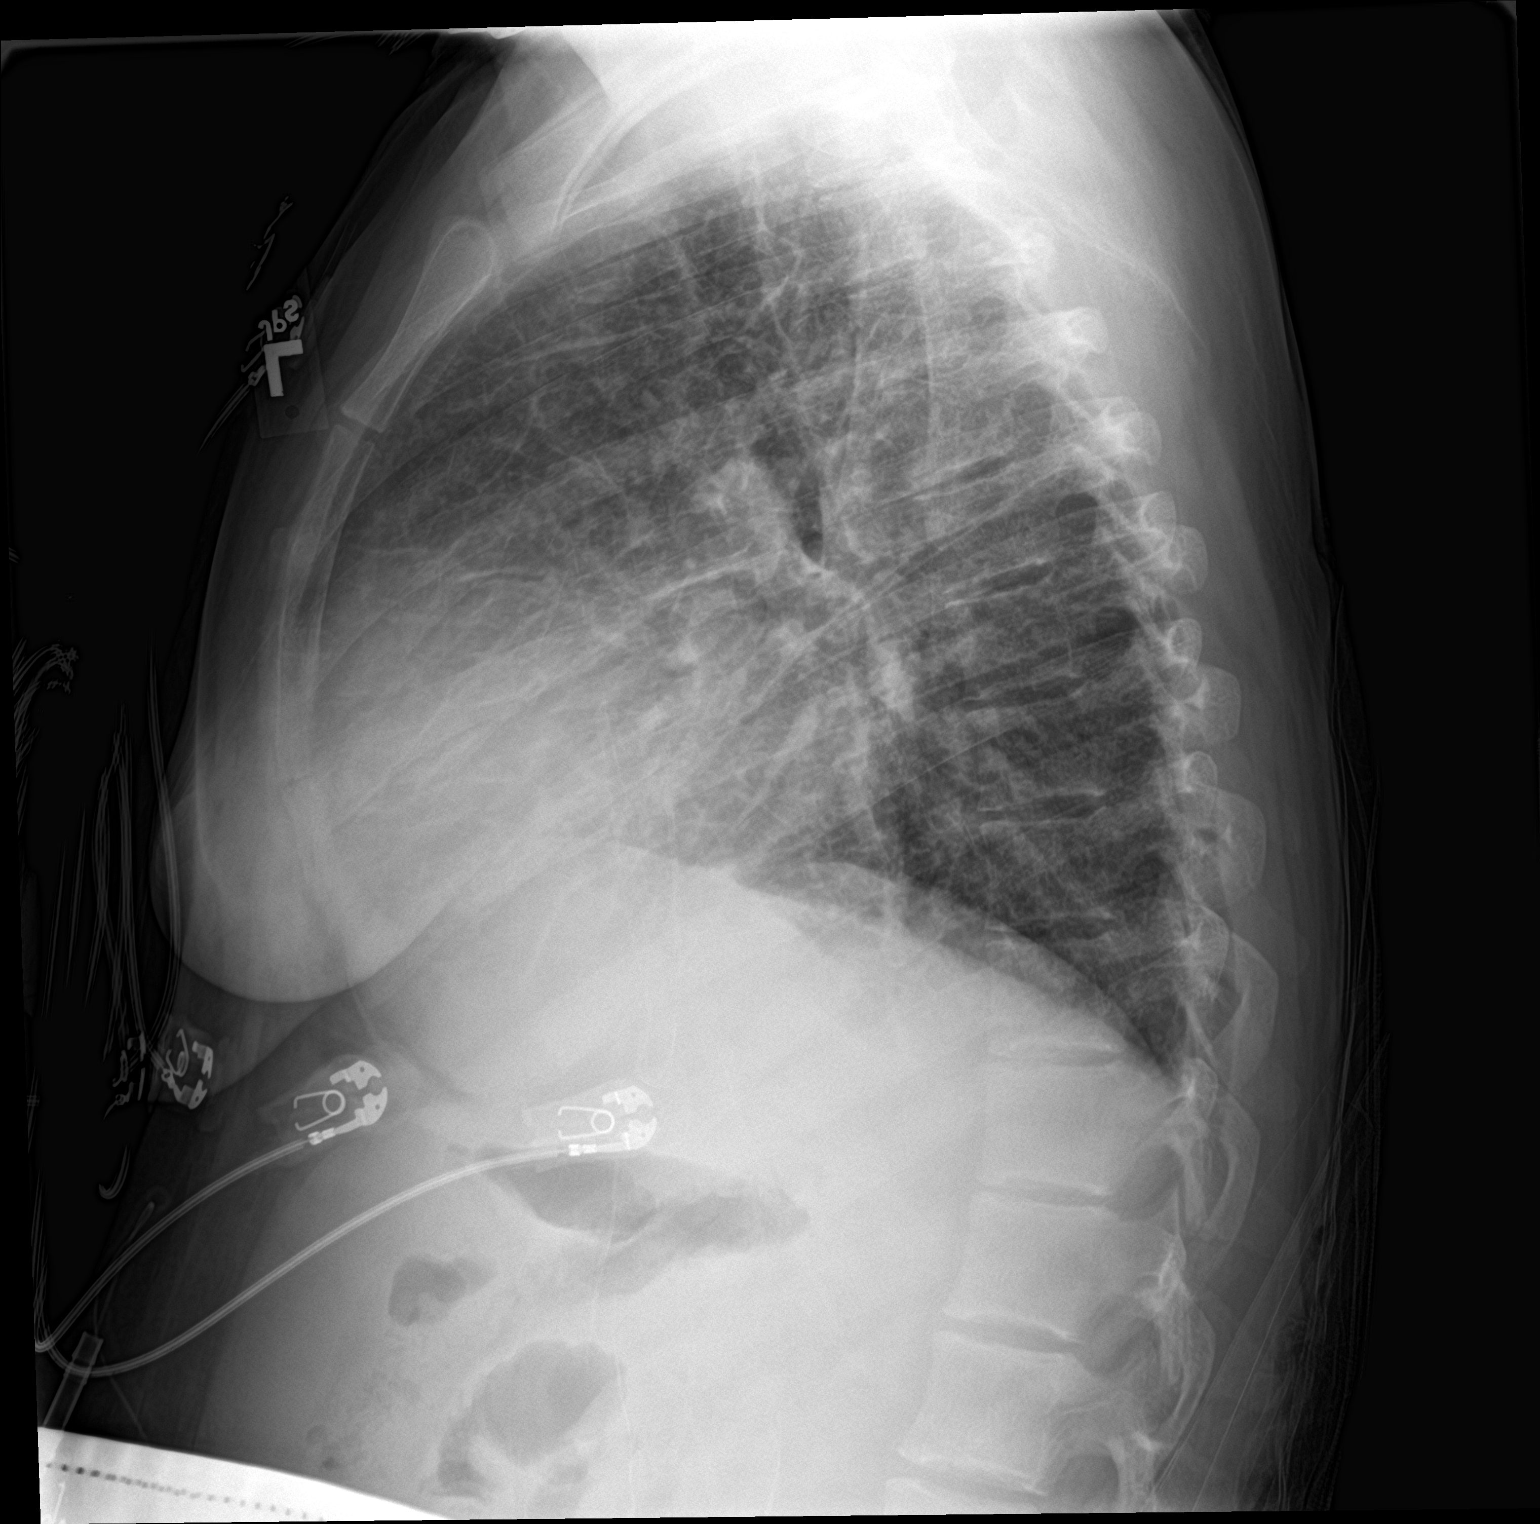

[2 of 2 positions shown; findings below may reference images not displayed]

FINDINGS: Cardiomediastinal contours remain enlarged with large mint of the
cardiac silhouette that again suggests pericardial fluid. Increased
interstitial markings, increased since the prior exam. No sign of
pleural effusion or consolidation.

Visualized skeletal structures are unremarkable.
IMPRESSION: Interval increase in interstitial markings suggesting interstitial
edema, likely related to heart failure or volume overload.
Persistent cardiomegaly with contours that again suggests
pericardial fluid.
